# Patient Record
Sex: Female | Born: 1950
Health system: Southern US, Community
[De-identification: ages and names within clinical notes are randomized; demographics above are authoritative.]

## PROBLEM LIST (undated history)

## (undated) DIAGNOSIS — E785 Hyperlipidemia, unspecified: Secondary | ICD-10-CM

## (undated) DIAGNOSIS — M5481 Occipital neuralgia: Secondary | ICD-10-CM

## (undated) DIAGNOSIS — R51 Headache: Secondary | ICD-10-CM

## (undated) DIAGNOSIS — E559 Vitamin D deficiency, unspecified: Principal | ICD-10-CM

## (undated) DIAGNOSIS — M5020 Other cervical disc displacement, unspecified cervical region: Secondary | ICD-10-CM

## (undated) DIAGNOSIS — T4145XA Adverse effect of unspecified anesthetic, initial encounter: Secondary | ICD-10-CM

## (undated) DIAGNOSIS — S62102A Fracture of unspecified carpal bone, left wrist, initial encounter for closed fracture: Secondary | ICD-10-CM

## (undated) DIAGNOSIS — G47419 Narcolepsy without cataplexy: Secondary | ICD-10-CM

## (undated) DIAGNOSIS — F419 Anxiety disorder, unspecified: Secondary | ICD-10-CM

## (undated) DIAGNOSIS — M1990 Unspecified osteoarthritis, unspecified site: Secondary | ICD-10-CM

## (undated) DIAGNOSIS — D509 Iron deficiency anemia, unspecified: Secondary | ICD-10-CM

## (undated) DIAGNOSIS — T8859XA Other complications of anesthesia, initial encounter: Secondary | ICD-10-CM

## (undated) DIAGNOSIS — Z9889 Other specified postprocedural states: Secondary | ICD-10-CM

## (undated) DIAGNOSIS — M797 Fibromyalgia: Secondary | ICD-10-CM

## (undated) DIAGNOSIS — R112 Nausea with vomiting, unspecified: Secondary | ICD-10-CM

## (undated) DIAGNOSIS — K219 Gastro-esophageal reflux disease without esophagitis: Secondary | ICD-10-CM

## (undated) DIAGNOSIS — G5602 Carpal tunnel syndrome, left upper limb: Principal | ICD-10-CM

## (undated) DIAGNOSIS — S62101A Fracture of unspecified carpal bone, right wrist, initial encounter for closed fracture: Secondary | ICD-10-CM

## (undated) DIAGNOSIS — G8929 Other chronic pain: Secondary | ICD-10-CM

## (undated) DIAGNOSIS — I10 Essential (primary) hypertension: Secondary | ICD-10-CM

## (undated) HISTORY — DX: Fracture of unspecified carpal bone, right wrist, initial encounter for closed fracture: S62.102A

## (undated) HISTORY — DX: Other chronic pain: G89.29

## (undated) HISTORY — DX: Carpal tunnel syndrome, left upper limb: G56.02

## (undated) HISTORY — PX: DILATION AND CURETTAGE OF UTERUS: SHX78

## (undated) HISTORY — DX: Iron deficiency anemia, unspecified: D50.9

## (undated) HISTORY — DX: Fibromyalgia: M79.7

## (undated) HISTORY — DX: Fracture of unspecified carpal bone, right wrist, initial encounter for closed fracture: S62.101A

## (undated) HISTORY — DX: Vitamin D deficiency, unspecified: E55.9

## (undated) HISTORY — PX: FRACTURE SURGERY: SHX138

## (undated) HISTORY — PX: TONSILLECTOMY: SUR1361

## (undated) HISTORY — PX: COLONOSCOPY: SHX174

## (undated) HISTORY — DX: Occipital neuralgia: M54.81

## (undated) HISTORY — DX: Narcolepsy without cataplexy: G47.419

## (undated) HISTORY — DX: Hyperlipidemia, unspecified: E78.5

## (undated) HISTORY — DX: Other cervical disc displacement, unspecified cervical region: M50.20

---

## 1979-11-22 HISTORY — PX: GASTRIC BYPASS: SHX52

## 1998-11-21 HISTORY — PX: ANTERIOR CERVICAL DECOMP/DISCECTOMY FUSION: SHX1161

## 1999-10-22 ENCOUNTER — Encounter: Payer: Self-pay | Admitting: Neurological Surgery

## 1999-10-26 ENCOUNTER — Inpatient Hospital Stay (HOSPITAL_COMMUNITY): Admission: RE | Admit: 1999-10-26 | Discharge: 1999-10-27 | Payer: Self-pay | Admitting: Neurological Surgery

## 1999-10-26 ENCOUNTER — Encounter: Payer: Self-pay | Admitting: Neurological Surgery

## 1999-10-26 ENCOUNTER — Encounter (INDEPENDENT_AMBULATORY_CARE_PROVIDER_SITE_OTHER): Payer: Self-pay | Admitting: *Deleted

## 2000-02-07 ENCOUNTER — Encounter: Payer: Self-pay | Admitting: Neurological Surgery

## 2000-02-07 ENCOUNTER — Encounter: Admission: RE | Admit: 2000-02-07 | Discharge: 2000-02-07 | Payer: Self-pay | Admitting: Neurological Surgery

## 2000-04-08 ENCOUNTER — Encounter: Payer: Self-pay | Admitting: Neurological Surgery

## 2000-04-08 ENCOUNTER — Ambulatory Visit (HOSPITAL_COMMUNITY): Admission: RE | Admit: 2000-04-08 | Discharge: 2000-04-08 | Payer: Self-pay | Admitting: Neurological Surgery

## 2001-08-07 ENCOUNTER — Encounter: Payer: Self-pay | Admitting: Family Medicine

## 2001-08-07 ENCOUNTER — Ambulatory Visit (HOSPITAL_COMMUNITY): Admission: RE | Admit: 2001-08-07 | Discharge: 2001-08-07 | Payer: Self-pay | Admitting: Family Medicine

## 2004-05-25 ENCOUNTER — Inpatient Hospital Stay (HOSPITAL_COMMUNITY): Admission: RE | Admit: 2004-05-25 | Discharge: 2004-05-27 | Payer: Self-pay | Admitting: Orthopedic Surgery

## 2004-11-21 HISTORY — PX: BONE GRAFT HIP ILIAC CREST: SUR159

## 2005-11-08 ENCOUNTER — Ambulatory Visit (HOSPITAL_COMMUNITY): Admission: RE | Admit: 2005-11-08 | Discharge: 2005-11-08 | Payer: Self-pay | Admitting: Anesthesiology

## 2008-06-11 ENCOUNTER — Inpatient Hospital Stay (HOSPITAL_COMMUNITY): Admission: EM | Admit: 2008-06-11 | Discharge: 2008-06-13 | Payer: Self-pay | Admitting: Emergency Medicine

## 2008-06-12 ENCOUNTER — Ambulatory Visit: Payer: Self-pay | Admitting: Internal Medicine

## 2008-06-12 ENCOUNTER — Encounter: Payer: Self-pay | Admitting: Internal Medicine

## 2008-07-25 ENCOUNTER — Ambulatory Visit (HOSPITAL_COMMUNITY): Admission: RE | Admit: 2008-07-25 | Discharge: 2008-07-25 | Payer: Self-pay | Admitting: Family Medicine

## 2008-08-11 ENCOUNTER — Ambulatory Visit (HOSPITAL_COMMUNITY): Admission: RE | Admit: 2008-08-11 | Discharge: 2008-08-11 | Payer: Self-pay | Admitting: Family Medicine

## 2009-12-10 ENCOUNTER — Ambulatory Visit (HOSPITAL_COMMUNITY): Admission: RE | Admit: 2009-12-10 | Discharge: 2009-12-10 | Payer: Self-pay | Admitting: Family Medicine

## 2010-12-11 ENCOUNTER — Encounter: Payer: Self-pay | Admitting: Anesthesiology

## 2011-04-05 NOTE — Op Note (Signed)
Amanda Osborne, Amanda Osborne             ACCOUNT NO.:  [id number]   MEDICAL RECORD NO.:  [id number]          PATIENT TYPE:  INP   LOCATION:  A331                          FACILITY:  [facility]   PHYSICIAN:  R. Roetta Sessions, M.D. DATE OF BIRTH:  10/25/1951   DATE OF PROCEDURE:  06/12/2008  DATE OF DISCHARGE:                               OPERATIVE REPORT   INDICATIONS FOR PROCEDURE:  The patient is a 60 year old lady with a  recent hematemesis, status post Roux-en-Y bariatric surgery back in the  80s.  She has dropped her hemoglobin into the 9 range, but remains  hemodynamically stable.  She has had a colonoscopy 2 months ago by Dr.  Kinnie Scales done in [place].  She takes multiple NSAIDs.  EGD is now being  done.  Risks, benefits, alternatives, and limitations have been reviewed  and questions answered.  Please see the documentation in the medical  record.   PROCEDURE NOTE:  O2 saturation, blood pressure, pulse, and respirations  were monitored throughout the entire procedure.   CONSCIOUS SEDATION:  Versed  6 mg IV and Demerol 125 mg IV in divided  doses.   ANESTHESIA:  Cetacaine spray for topical pharyngeal anesthesia.   INSTRUMENT:  Pentax video chip system.   FINDINGS:  Examination of the tubular esophagus revealed cream-colored  raised plaques covering the entire esophageal mucosa.  These plaques  could not be washed off very easily.  There was a noncritical view of  peptic stricture with two areas of ulceration/tear, friable mucosa with  mucosal disruption, friability at the EG junction.  The EG junction was  easily traversed.  The residual stomach was small and empty.  Examination of the anastomosis with small bowel, there was a 2-cm area  of linear ulceration.  There was a clean base.  The areas were friable  but was not actively bleeding.  The anastomosis of the small bowel was  patent and easily traversed and examination of several centimeters of  the efferent limb of small bowel  revealed no abnormalities.  The scope  was pulled back into the stomach and this area was reexamined with no  additional findings.  The scope was pulled back in the esophagus with  biopsies of the esophagus and KOH brushing were taken.  The patient  tolerated the procedure well and was reactive to endoscopy.   IMPRESSION:  1. Two areas of ulceration, mucosal disruption of the esophagogastric      junction overlying a noncritical peptic stricture likely consistent      with Mallory-Weiss tear in the background setting of      gastroesophageal reflux disease likely the primary cause of      bleeding and anastomotic ulcer without bleeding stigmata.  2. Patent normal-appearing efferent limb.  3. Marked esophageal plaques suspicious for Candida esophagitis status      post biopsy brushing.   RECOMMENDATIONS:  1. It would be best to avoid nonsteroidals entirely.  2. Check Helicobacter pylori stool antigen test and treat if positive.  3. Follow up with biopsies and KOH prep.  4. Add Carafate 1 g slowly q.i.d., agree with  continuing PPI therapy.  5. Clear liquid diet.  6. Further recommendations to follow.      Jonathon Bellows, M.D.  Electronically Signed     RMR/MEDQ  D:  06/12/2008  T:  06/13/2008  Job:  [id number]   cc:   Lorin Picket A. Gerda Diss, MD  Fax: [phone]

## 2011-04-05 NOTE — Consult Note (Signed)
NAMECALYSE, MURCIA             ACCOUNT NO.:  [id number]   MEDICAL RECORD NO.:  [id number]          PATIENT TYPE:  INP   LOCATION:  A331                          FACILITY:  [facility]   PHYSICIAN:  R. Roetta Sessions, M.D. DATE OF BIRTH:  07-14-1951   DATE OF CONSULTATION:  06/12/2008  DATE OF DISCHARGE:                                 CONSULTATION   REASON FOR CONSULTATION:  GI bleed.   HISTORY OF PRESENT ILLNESS:  The patient is a 60 year old female with  history of stomach stapling in the 1980s, who has chronic pain,  followed by the pain clinic, currently on oxycodone 20 mg t.i.d., but  also takes Voltaren gel 3-4 times a week and BC Powders 1 three times a  day, who presents with complaints of hematemesis and hematochezia which  occurred approximately a week ago.  She states that she had felt fine.  She does have episodes; if she overeats, she becomes nauseated and  occasionally vomits.  Last Tuesday, when she went to eat supper, she had  one or two bites and already felt full, and then proceed to vomit.  She  vomited up fresh blood and blood clots.  The following day, she did have  a bowel movement which was normal, followed by nothing but fresh blood.  She has felt more weak.  She has had some epigastric burning.  She  finally decided to come for evaluation yesterday.  In the emergency  department, on digital rectal exam she was noted to have black,  Hemoccult-positive stools.  Her hemoglobin was 9.7, hematocrit 29.6, MCV  86.5.  Today, her hemoglobin is 9.5.  She has had no further hematemesis  and no bowel movements now in over 3 days.  She complains of chronic  constipation.  She generally takes Vear Clock milk of magnesia capsules  every other day.  Denies any chronic intermittent bleeding.  No  heartburn, dysphagia, or odynophagia.  No unintentional weight loss.   MEDICATIONS AT HOME:  1. Voltaren gel 3-4 times a week.  2. BC Powders.  For the last 3 months, she has been  taking 1 packet 3      times a day.  She states before that she took even more.  3. OxyContin 20 mg t.i.d.  4. Valium 5 mg t.i.d.  5. Iron 65 mg daily.  6. Multivitamin daily.  7. Pepcid p.r.n.  8. Phillips milk of magnesia capsules 4 every other day.   ALLERGIES:  No known drug allergies.   PAST MEDICAL HISTORY:  1. Stomach stapling, 1981.  2. Right shoulder/arm surgery in 2005.  Apparently, she had a right      Colles' fracture which was initially repaired in [place] but required      a redo in [place] because of poor outcome.  3. She has had neck surgery in 2000.  She states she has now a C5-C6      herniated disc but does not desire to have any further surgery.  4. She had colonoscopy a couple months ago by Dr. Kinnie Scales, and she      reports this to  be normal.  5. She describes having a remote EGD with Dr. Jena Gauss.  We are trying to      retrieve those results.  6. She sees pain management for neuralgia in neck, knees, wrist pain.  7. She believes she has a history of anemia off and on, and she is      taking iron.   FAMILY HISTORY:  She has two cousins with a history of colon cancer.  No  family history of peptic ulcer disease or other chronic GI illnesses.   SOCIAL HISTORY:  She is divorced.  She has a son.  Denies tobacco or  alcohol use.  Currently unemployed.   REVIEW OF SYSTEMS:  See HPI for GI.  CONSTITUTIONAL:  No unintentional  weight loss.  CARDIOPULMONARY:  No chest pain or shortness of breath.  GENITOURINARY:  No dysuria or hematuria.   PHYSICAL EXAMINATION:  VITAL SIGNS:  Height 68 inches, weight 98.1 kg,  temperature 98.8, pulse 76, respirations 18, blood pressure 122/71.  GENERAL:  Pleasant, well-nourished, well-developed Caucasian female in  no acute distress.  SKIN:  Warm and dry.  No jaundice.  HEENT:  Sclerae anicteric.  Oral mucosa moist and pink.  NECK:  No lymphadenopathy.  CHEST:  Lungs clear to auscultation.  CARDIAC:  Reveals regular rate and  rhythm.  Normal S1 and S2.  No  murmurs, rubs, or gallops.  ABDOMEN:  Positive bowel sounds.  Abdomen is obese, soft.  She has mild  epigastric tenderness to deep palpation.  No rebound or guarding.  No  organomegaly or masses.  No abdominal bruits or hernias.  LOWER EXTREMITIES:  No edema.   LABORATORIES:  White count 5600, hemoglobin today is 9.5, platelets  461,000.  Sodium 137, potassium 4, BUN 13, creatinine 0.56, glucose 113.   IMPRESSION:  The patient is a 60 year old lady with history of weight  loss surgery in the 1980s, now with hematemesis and bright red blood per  rectum, likely secondary to an upper GI bleed.  She reported normal  colonoscopy a couple months ago with Dr. Kinnie Scales.  She is chronic NSAIDs  and aspirin use in the way of BC Powders 3 times daily.  She is on  Voltaren gel, which can also induce GI bleeding.  I suspect she may have  peptic ulcer disease.   RECOMMENDATIONS:  1. EGD today.  2. PPI therapy.  3. Check coags.  4. Further recommendations to follow.      Tana Coast, P.AJonathon Bellows, M.D.  Electronically Signed    LL/MEDQ  D:  06/12/2008  T:  06/12/2008  Job:  [id number]   cc:   Lorin Picket A. Gerda Diss, MD  Fax: [phone]

## 2011-04-05 NOTE — H&P (Signed)
Amanda Osborne, Amanda Osborne             ACCOUNT NO.:  [id number]   MEDICAL RECORD NO.:  [id number]          PATIENT TYPE:  INP   LOCATION:  A331                          FACILITY:  [facility]   PHYSICIAN:  Donna Bernard, M.D.DATE OF BIRTH:  [birth year]   DATE OF ADMISSION:  06/11/2008  DATE OF DISCHARGE:  LH                              HISTORY & PHYSICAL   CHIEF COMPLAINT:  Weakness and vomiting blood.   SUBJECTIVE:  This patient is a 60 year old white female with a history  of chronic pain secondary to neuralgia and neuropathy, narcolepsy, and  chronic anxiety who presented to the ER today with subacute concerns.  The patient was eating a meal about a week ago, developed some  epigastric discomfort suddenly, and it was quite burning and  uncomfortable, and she proceeded to throw up a couple times large amount  of bright blood.  The following morning, the patient passed some dark  maroon blood per rectum almost uncontrolled.  She describes some  epigastric discomfort at times with this.  Of note, she did have a  colonoscopy earlier this year, which was reportedly negative.  The  patient then called our office on Friday with the history, she was  instructed to go to the emergency room immediately.  She failed to do  this.  Today, she was on her way to a [facility] when she noted  some epigastric discomfort accompanied by slight nausea and progressive  weakness.  She also had another bowel movement today, which appeared to  be quite dark with blood and she felt even though she really did not  want to go to the hospital that she was forced to do and so she came on  in here.  The patient claims compliance with medications, which include  Valium 5 mg p.o. t.i.d. p.r.n. for anxiety, OxyContin 20 mg p.o. t.i.d.  on a regular basis, and Pepcid AC p.r.n. for symptomatic care.  The  patient also had an iron tablet one a day after developing this bleeding  last week.   PAST SURGICAL  HISTORY:  1. Stomach stapling and gastric bypass surgery in 1981.  2. Right wrist fracture in 2005.  3. Cervical disk disease in 2000.   FAMILY HISTORY:  Positive for coronary artery disease.   ALLERGIES:  None known.   SOCIAL HISTORY:  The patient is single.  She does not smoke.  No alcohol  abuse.  She is under a lot of stress because she lost her job back in  September and is due to run out of her insurance also.   REVIEW OF SYSTEMS:  Otherwise negative.   PHYSICAL EXAMINATION:  GENERAL:  Somewhat pale appearing tearful anxious  female.  VITAL SIGNS:  Blood pressure 130/71 and pulse 85.  NEUROLOGIC:  The patient is alert and oriented x3.  HEENT:  Normal.  NECK:  Supple.  LUNGS:  Clear.  HEART:  Regular rate and rhythm.  ABDOMEN:  Slight epigastric tenderness, but very minimal.  No rebound.  No guarding.  RECTAL:  Per Dr. Margretta Ditty grossly heme-positive stool.  EXTREMITIES:  Thin.  SIGNIFICANT LABS:  White blood count 5.6, hemoglobin 9.7, and platelets  467.  Potassium 4.0 and bicarb 26.  UA unremarkable.   IMPRESSION:  1. Acute gastrointestinal bleed, likely upper gastrointestinal with      negative colonoscopy several months ago and more specifically      because of nature of history with history of gastric bypass the      patient may have developed anastomotic ulcer.  She could not have      developed a tear with subsequent bleeding.  Certainly will warrant      endoscopy.  2. Chronic pain.  3. Chronic anxiety.  4. Recent stressors with losing job and being single and having very      limited resources in the community.   PLAN:  IV fluids, IV Protonix, type and cross blood, GI consultation,  maintain chronic medications, Zofran for nausea, and p.r.n. medications.      Donna Bernard, M.D.  Electronically Signed     WSL/MEDQ  D:  06/11/2008  T:  06/12/2008  Job:  [id number]

## 2011-04-05 NOTE — Discharge Summary (Signed)
Amanda Osborne, Amanda Osborne             ACCOUNT NO.:  [id number]   MEDICAL RECORD NO.:  [id number]          PATIENT TYPE:  INP   LOCATION:  A331                          FACILITY:  [facility]   PHYSICIAN:  Scott A. Gerda Diss, MD    DATE OF BIRTH:  [birth year]   DATE OF ADMISSION:  06/11/2008  DATE OF DISCHARGE:  07/24/2009LH                               DISCHARGE SUMMARY   DISCHARGE DIAGNOSES:  1. Reflux.  2. Mallory-Weiss tear.  3. Esophageal candidiasis.  4. Status post bariatric surgery.  5. Anxiety and depression.  6. Chronic pain.   HOSPITAL COURSE:  This patient was admitted on June 11, 2008, after  significant weakness and multiple episodes of vomiting, and she noticed  that after throwing up several times, she threw up a large amount of  bright blood.  She continued to have some maroon blood per rectum, and  she felt herself to feel really quite bad.  She was going to her [facility] on the day of admission when she was having some slight  nausea and weakness and had another bowel movement on the same day that  was maroon and at that point in time, came in and was seen by Dr. Lubertha South, and the hemoglobin was down to 9.7 and it has felt that the  patient was having an acute GI bleed.  She was admitted in, consultation  was done by Dr. Jena Gauss and they felt most likely upper GI bleed.  She had  been using chronic NSAIDs, aspirin, and BC Powder.  She has been told  not to do that anymore.  She was also told not to use Voltaren gel, and  she went ahead and had an EGD done.  An EGD was done on June 12, 2008,  and it showed candidiasis esophageal, also suspected Mallory-Weiss tear,  and anastomosis ulcer, and she was encouraged to be on PPI and Diflucan  upon going home.  Her hemoglobin was stable on June 13, 2008, and is  felt she can go home.  She is to follow up in our office somewhere in  the next couple of weeks and follow up with GI within the next couple of  weeks and  return to the ED or office immediately if vomiting up blood or  maroon stools.      Scott A. Gerda Diss, MD  Electronically Signed     SAL/MEDQ  D:  07/18/2008  T:  07/18/2008  Job:  [id number]

## 2011-04-05 NOTE — Group Therapy Note (Signed)
NAMEADARIA, Amanda Osborne             ACCOUNT NO.:  [id number]   MEDICAL RECORD NO.:  [id number]          PATIENT TYPE:  INP   LOCATION:  A331                          FACILITY:  [facility]   PHYSICIAN:  Scott A. Gerda Diss, MD    DATE OF BIRTH:  30-Sep-1951   DATE OF PROCEDURE:  DATE OF DISCHARGE:                                 PROGRESS NOTE   Overall, she is doing better today.  She is still having some burning in  her stomach.  She is awaiting an EGD, it should be done later today.  Her hemoglobin is stable.  She has been using BC powders.  She also has  stapling of her stomach with what sounds to be a gastric bypass that was  done in the early 1980s.  I told her that she needs to stay away from  all NSAIDs, including NSAID creams and stick with the OxyContin and use  Tylenol as needed for pain.  She will possibly go home depending on test  outcome and repeat hemoglobin.      Scott A. Gerda Diss, MD  Electronically Signed     SAL/MEDQ  D:  06/12/2008  T:  06/12/2008  Job:  [id number]

## 2011-04-08 NOTE — Op Note (Signed)
Amanda Osborne, Amanda Osborne                        ACCOUNT NO.:  [id number]   MEDICAL RECORD NO.:  [id number]                   PATIENT TYPE:  INP   LOCATION:  2899                                 FACILITY:  [facility]   PHYSICIAN:  Dionne Ano. Everlene Other, M.D.         DATE OF BIRTH:  [birth year]   DATE OF PROCEDURE:  DATE OF DISCHARGE:                                 OPERATIVE REPORT   PREOPERATIVE DIAGNOSES:  1. Adhesive capsulitis, right shoulder.  2. Malunion, right distal radius, status post fracture.   POSTOPERATIVE DIAGNOSES:  1. Adhesive capsulitis, right shoulder.  2. Malunion, right distal radius, status post fracture.   PROCEDURE PERFORMED:  1. Manipulation under anesthesia, right shoulder.  2. Right distal radius corrective osteotomy with iliac crest bone graft and     placement of a 2.7 condylar blade plate.  3. Brachial radialis tenotomy.  4. Stress radiography.  5. Posterior interosseus nerve neurectomy.  6. Extensor pollicis longus decompression and transpositioning into the     subcutaneous tissue.   SURGEON:  Dionne Ano. Amanda Pea, M.D.   ASSISTANT:  Karie Chimera, P.A.-C.   ANESTHESIA:  General.   DRAINS:  One in the hip, one in the wrist.   COMPLICATIONS:  None.   INDICATIONS FOR PROCEDURE:  Mr. Moccio is a very pleasant 60 year old  female who presents with the above-mentioned diagnoses.  I have counseled  her in regards to the risks and benefits of the surgery, including the risk  of infection, bleeding, anesthesia, damage to normal structures, and failure  of surgery to __________, restoring function.  With this in mind, she  desires to proceed.  All questions have been encouraged and answered  preoperatively.   OPERATIVE FINDINGS:  This patient is a very pleasant 60 year old female who  had a malunion.  This was corrected with corrective osteotomy to acceptable  levels, including acceptable radial inclination, radial height and volar  tilt.  Manipulation  under anesthesia provided restoration of her shoulder  function, and there was no iatrogenic fracture that occurred, I should note.  I was pleased with the reconstruction measures and the x-rays noted  intraoperatively.  I should note that she will be managed aggressively in  terms of her rehabilitative course postoperatively.   OPERATION:  Patient seen by myself and anesthesia, taken to the operative  suite.  Preoperative Ancef was given.  She was placed in the supine position  and appropriately padded.  The table was then turned.  Once this was done, I  performed manipulation under anesthesia of the shoulder. She had adhesive  capsulitis in the shoulder and underwent a manipulation.  I began the  manipulation and flexion.  She had manipulation and flexion with a short  lever arc.  Following this, I manipulated her in abduction followed by  internal and external rotation.  I disreleased adhesions. There were no  dislocating features or fracture that occurred.  She was able to be  manipulated into acceptable parameters, and I was pleased with this.  Thus  with this performed, I then turned attention towards the patient's wrist.  She was prepped and draped in the usual sterile fashion about the right  upper extremity and right hip region.  The patient did have some adipose  tissue, which was abundant, about the abdominal region, and thus this was  taped accordingly.  Following this, I then began the operation with a dorsal  midline incision based over the interval between the third and fourth dorsal  compartments.  I dissected down and elevated the subcu off of the extensor  retinaculum.  Hemostasis was obtained with bipolar electrocautery.  Following this, the EPL tendon sheath was decompressed, and the tendon was  transposed.  Following this, I then dissected down to the bony region.  This  was elevated meticulously off of the bone where the malunion was present.  A  fourth dorsal  compartment was protected.  Following this, the brachial  radialis underwent tenotomy without difficulty.  I performed a tenotomy of  the brachial radialis without problems.  This was isolated and identified,  of course.  Following this, I performed a posterior interosseus nerve  neurectomy.  This was performed by identifying the posterior interosseus  nerve and resecting a 3 cm segment and allowing this to retract proximally.  Following this, I then placed a needle in the joint for purposes of  outlining the inclination and then placed two 0.062 K wires distally just  off of the subchondral bone.  This was placed under radiograph.  Once these  were placed, I then outlined my marks for the corrective osteotomy, placed  an oscillating saw through the proposed osteotomy site and used this to  break the dorsal and the medial and lateral cortices.  The lower cortice was  not violated.  Following this, I then used the 0.062 K wires as a lever arm  and placed the appropriate amount of correction.  Dorsally, the patient  required a graft to fit at least a 1 cm gap.  This was measured.  Following  this, I deflated the tourniquet and obtained an iliac crest bone graft.  I  incised the iliac crest, which was previously prepared and Marcaine with  epinephrine was placed in this region.  Following the incision, the patient  then had retractors placed.  There was a large amount of adipose tissue, and  I very carefully dissected down to the iliac crest.  Baby Bennett retractors  and Cobb elevator were used to protect the abdominal musculature.  The  patient then had an oscillating saw used to obtain a tricortical bone graft.  This was performed to my satisfaction without difficulty.  Following  placement of the oscillating saw, the tricortical bone graft was obtained,  and cancellous bone graft was obtained as well.  Once this was done, I then irrigated copiously and obtained hemostasis.  Gelfoam and  thrombin were  placed in the wound, and a moistened lap was placed in here as well.  Following this, tricortical bone graft was placed in the distal radius.  The  tourniquet was inflated for a brief period of time, once again, and the  tricortical bone graft underwent bone carpentry and then was placed in the  defect.  This corrected the patient to excellent radiographic parameters,  including radial height, inclination, and volar tilt.  I then placed a 2.7  blade plate on the construct and packed additional bone graft as  necessary.  The tricortical graft fit excellently, and I was extremely pleased with this  as well as the 2.7 blade plate purchase, which was applied in a standard AO  technique.  X-rays confirmed this, and permanent films were taken for  documentation.  Following this, the area was irrigated copiously.  I then  performed closure by closing the deep periosteal tissues and covering the  plate nicely.  The EBL was left transposed out of harm's way, and the fourth  dorsal compartment did not encroach upon the plate.  I then closed the wound  over a #7 TLS drain with 3-0 Vicryl suture and Prolene.  Hemostasis was  excellent.  A small amount of Marcaine 0.25% without epinephrine was placed  for postop analgesia purposes.  Patient then had sterile dressing placed.  Attention was then turned towards the hip, where copious irrigation was  applied.  The deep tissue was closed with 0 Vicryl followed by 2-0 Vicryl in  the subcutaneous tissue followed by staple gun at the skin edge.  The  patient tolerated this well, and there were no complicating features.  The  medium Hemovac drain, which was placed prior to deep fascial closure was  hooked up to suction.  The patient tolerated this well.  There were no  complicating features.  She was extubated and transferred to the recovery  room, where she will be monitored closely.  We will plan for pain  management, IV antibiotics, OT consult,  as well as other general measures.  I have discussed with the family all issues.  It has been an absolute  pleasure participating in her  care, and we look forward to participating in her postoperative recovery.  I  was quite pleased with the final position and correction and noted that the  patient had soft compartments, excellent refill, and was stable in the  recovery room.                                               Dionne Ano. Everlene Other, M.D.    [facility]  D:  05/25/2004  T:  05/26/2004  Job:  [id number]   cc:   Dr. Julieta Gutting, [place]   Titus Plomaritis  [address] [place]  [place] [address]  Fax: [phone]

## 2011-04-08 NOTE — H&P (Signed)
[facility]  Patient:    Amanda Osborne                       MRN: [id number] Adm. Date:  [id number] Attending:  Jonne Ply                         History and Physical  ADMISSION DIAGNOSES: 1. Cervical herniated nucleus pulposus, C6-7 with left C7 radiculopathy. 2. Right shoulder lipoma.  HISTORY OF PRESENT ILLNESS:  The patient is a 60 year old individual who works s a Loss adjuster, chartered.  She has had periods of arm pain that started in 1996.  She developed neck pain starting in August of 1999, and she notes that the pain feels as though there is a pressure sensation in her neck which will radiate into the  left arm and hand and cause numbness into the left hand and fingers.  She has had pain in the right lower extremity and weakness that she feels is in the right arm and right leg.  She has tingling over the left arm and shoulder.  She has some difficulty with urinary control which has been ongoing for sometime.  She has significant headache.  She has been treating this condition conservatively.  I initially evaluated her in July of this year and advised further conservative management; however, in August of this year, she returned with continued complaints of significant neck, shoulder, and left arm pain.  She was advised regarding surgical decompression of the C6-7 disk herniation that she has with compression of the left C7 nerve root.  The patient also notes that she has the presence of a right shoulder lipoma overlying the scapula.  This measures 10 cm x 8 cm and raises the skin approximately 1 cm over the shoulder itself.  This is moveable.  She wishes that this should be removed.  PAST MEDICAL HISTORY:  Her general health has been fair.  She has had two surgical procedures including a stomach stapling in 1981 and nerves removed from her uterus in 1972.  She takes no medications on a chronic basis.  SOCIAL HISTORY:  She  does not smoke.  She drinks alcohol socially.  She has had a 50-pound weight gain over the past year to a current weight of 248 pounds.  MEDICATIONS:  Pepcid AC, Xanax, Soma, Vicodin up to every 6 hours.  REVIEW OF SYSTEMS:  Notable for the items in the History of Present Illness in addition to swelling of feet and hands, leg pain while walking, ringing in the ears, wearing of glasses, incontinence, increased appetite and anxiety.  FAMILY HISTORY:  Reveals significant diabetes and heart problems in her mother Anselm Jungling is age 70, in poor health, and has a pacemaker.  Her father is 54 years old and in good health.  PHYSICAL EXAMINATION:  GENERAL:  She is an alert, oriented, and cooperative individual in no overt distress.  She does appear to be fairly anxious.  MUSCULOSKELETAL:   There is a moderate amount of paravertebral spasm and tenderness in her neck.  There is tenderness in the supraclavicular fossae in both proximal shoulders.   Range of motion in her neck reveals that she turns 60 degrees to the right and 60 degrees to the left.  She extends 30 degrees and flexes 10 degrees. Axial compression does not reproduce significant pain.  NEUROLOGIC:  Motor strength in the upper extremities reveals the deltoid, biceps, triceps,  grips, and intrinsics have good strength to confrontation.  Tone and bulk are normal.  Sensation to pin and light touch in the distal upper extremities is diminished on the left side in the C7-C8 distribution.  Cranial nerve examination reveals the pupils are 4 mm, briskly reactive to light and accommodation.  The extraocular movements are full.  The face is symmetric o grimace.  Tongue and uvula are in the midline.  HEENT:  Sclerae and conjunctivae are clear.  LUNGS:  Clear to auscultation.  HEART:  Regular rate and rhythm.  No murmurs are heard.  ABDOMEN:  Soft, protuberant.  Bowel sounds are positive.  No masses are identified.  EXTREMITIES:   Reveal no cyanosis, clubbing, or edema.  SKIN:  On the back, on the right shoulder, there is a freely palpable lipoma that is 10 cm in length, 8 cm in width.  It raises the skin approximately 1 cm. This overlays the center of the right scapula.  IMPRESSION:  The patient has cervical herniated nucleus pulposus at C6-7.  She lso has some spondylitic disease at C5-6; however, this does not cause any canal compromise.  She has been advised regarding surgical decompression of this area and is now being admitted to undergo surgical intervention and removal of a right shoulder lipoma. DD:  10/26/99 TD:  10/26/99 Job: [id number]

## 2011-04-08 NOTE — Op Note (Signed)
. [facility]  Patient:    Amanda Osborne                       MRN: [id number] Proc. Date: 10/26/99 Adm. Date:  [id number] Attending:  Jonne Ply                           Operative Report  PREOPERATIVE DIAGNOSES: 1. C6-7 herniated nucleus pulposus with left C7 radiculopathy. 2. Right shoulder lipoma.  POSTOPERATIVE DIAGNOSES: 1. C6-7 herniated nucleus pulposus with left C7 radiculopathy. 2. Right shoulder lipoma.  PROCEDURE:  Resection of right shoulder lipoma and anterior cervical diskectomy and arthrodesis, C6-7.  Fixation with Synthes plate.  SURGEON:  Stefani Dama, M.D.  ANESTHESIA:  General endotracheal.  INDICATIONS:  The patient is a 60 year old individual who has had significant neck, shoulder, and left arm pain.  In addition, she has the presence of a right shoulder lipoma.  She has failed extensive efforts at conservative management and is taken the operating room for surgery.  DESCRIPTION OF PROCEDURE:  The patient was brought to the operating room and placed on the table in the supine position.  After the smooth induction of general endotracheal anesthesia, she was turned on to her left lateral decubitus position. The right shoulder was prepped over the region of the scapula and a 3 cm incision was created in the midline after infiltrating with 1% lidocaine with epinephrine. The subcutaneous tissue was explored and a freely-movable lipoma was easily identified in the subcutaneous tissue and then, by blunt dissection using finger technique, the lipoma was freed of its fascial attachments and delivered through this incision.  Attachments to a stalk which had some vascularity to it were cauterized and then divided.  The stalk and the specimen were sent to pathology. The cavity was then identified and examined.  Hemostasis was achieved within it. No remnants of a lipoma were found and the wound was closed with  2-0 Vicryl and 3-0 Vicryl in the subcuticular tissues.  A Tegaderm dressing was placed on it.  The  patient was then turned into the supine position, placed in five pounds of halter traction.  The neck was prepped with DuraPrep and draped in a sterile fashion. A transverse incision was made at the base of the neck on the left side.  The plane between the sternocleidomastoid and the strap muscles was dissected bluntly until the prevertebral space was reached.  The first identifiable disk space was noted to be C5-6 on a radiograph.  Dissection was then carried inferiorly and a Caspar retractor was placed in the wound.  Diskectomy was then performed using a combination of curets and rongeurs and a Midas Rex and an A2 bur to dissect and  remove the lateral uncinate processes, both of which were modestly enlarged. In the left lateral gutter, free fragments of disk were encountered, which were fairly scarred to the inferior portion of the body of C7.  These were removed and decompressed the lateral recess nicely.  Hemostasis from the dural veins was obtained with some Gelfoam soaked in thrombin, which was later removed.  The right side was similarly decompressed, but no fragments of disk were identified here. A 7 mm round fibular graft was placed into the interspace, traction was removed, nd the neck was placed in slight flexion.  An 18 mm small-stature Synthes plate was then affixed with four locking 12 x 4 mm screws.  A localizing radiograph identified good position of the hardware in the graft.  Hemostasis from the soft tissues was identified and obtained with the bipolar cautery, and then the platysma was closed with 2-0 Vicryl in interrupted fashion, with 3-0 Vicryl used subcuticularly.  The patient tolerated the procedure wel and was returned to the recovery room in stable condition. DD:  10/26/99 TD:  10/26/99 Job: [id number]

## 2011-08-19 LAB — DIFFERENTIAL
Basophils Relative: 1
Eosinophils Absolute: 0.4
Eosinophils Relative: 7 — ABNORMAL HIGH
Lymphs Abs: 1.2
Monocytes Relative: 8
Neutro Abs: 3.5
Neutrophils Relative %: 64

## 2011-08-19 LAB — HEMOGLOBIN AND HEMATOCRIT, BLOOD
HCT: 27.3 — ABNORMAL LOW
HCT: 27.5 — ABNORMAL LOW
HCT: 28.7 — ABNORMAL LOW
Hemoglobin: 8.6 — ABNORMAL LOW
Hemoglobin: 9.1 — ABNORMAL LOW
Hemoglobin: 9.2 — ABNORMAL LOW
Hemoglobin: 9.5 — ABNORMAL LOW

## 2011-08-19 LAB — PROTIME-INR
INR: 1
Prothrombin Time: 12.9

## 2011-08-19 LAB — CROSSMATCH

## 2011-08-19 LAB — CBC
Hemoglobin: 9.7 — ABNORMAL LOW
MCHC: 32.9
Platelets: 461 — ABNORMAL HIGH
RBC: 3.42 — ABNORMAL LOW
RDW: 14
WBC: 5.6

## 2011-08-19 LAB — URINALYSIS, ROUTINE W REFLEX MICROSCOPIC
Glucose, UA: NEGATIVE
Ketones, ur: NEGATIVE
Specific Gravity, Urine: 1.025
pH: 5

## 2011-08-19 LAB — BASIC METABOLIC PANEL: Creatinine, Ser: 0.56

## 2011-08-19 LAB — KOH PREP

## 2011-08-19 LAB — ABO/RH: ABO/RH(D): O NEG

## 2012-02-08 ENCOUNTER — Other Ambulatory Visit: Payer: Self-pay | Admitting: Family Medicine

## 2012-02-08 DIAGNOSIS — Z139 Encounter for screening, unspecified: Secondary | ICD-10-CM

## 2012-02-16 ENCOUNTER — Ambulatory Visit (HOSPITAL_COMMUNITY): Payer: Self-pay

## 2012-05-02 ENCOUNTER — Ambulatory Visit (HOSPITAL_COMMUNITY)
Admission: RE | Admit: 2012-05-02 | Discharge: 2012-05-02 | Disposition: A | Discharge status: Home / Self Care | Payer: Medicare Other | Source: Ambulatory Visit | Attending: Family Medicine | Admitting: Family Medicine

## 2012-05-02 ENCOUNTER — Other Ambulatory Visit: Payer: Self-pay | Admitting: Family Medicine

## 2012-05-02 DIAGNOSIS — M5030 Other cervical disc degeneration, unspecified cervical region: Secondary | ICD-10-CM | POA: Insufficient documentation

## 2012-05-02 DIAGNOSIS — M542 Cervicalgia: Secondary | ICD-10-CM | POA: Insufficient documentation

## 2012-05-02 DIAGNOSIS — M5412 Radiculopathy, cervical region: Secondary | ICD-10-CM

## 2012-05-02 DIAGNOSIS — Z981 Arthrodesis status: Secondary | ICD-10-CM | POA: Insufficient documentation

## 2012-05-30 ENCOUNTER — Other Ambulatory Visit: Payer: Self-pay | Admitting: Family Medicine

## 2012-05-30 DIAGNOSIS — E041 Nontoxic single thyroid nodule: Secondary | ICD-10-CM

## 2012-06-07 ENCOUNTER — Ambulatory Visit (HOSPITAL_COMMUNITY)
Admission: RE | Admit: 2012-06-07 | Discharge: 2012-06-07 | Disposition: A | Discharge status: Home / Self Care | Payer: Medicare Other | Source: Ambulatory Visit | Attending: Family Medicine | Admitting: Family Medicine

## 2012-06-07 DIAGNOSIS — E041 Nontoxic single thyroid nodule: Secondary | ICD-10-CM

## 2012-11-30 ENCOUNTER — Encounter (HOSPITAL_COMMUNITY): Payer: Medicare Other | Attending: Oncology | Admitting: Oncology

## 2012-11-30 ENCOUNTER — Encounter (HOSPITAL_COMMUNITY): Payer: Self-pay | Admitting: Oncology

## 2012-11-30 VITALS — BP 127/79 | HR 84 | Temp 96.4°F | Resp 18 | Ht 66.5 in | Wt 207.9 lb

## 2012-11-30 DIAGNOSIS — IMO0001 Reserved for inherently not codable concepts without codable children: Secondary | ICD-10-CM | POA: Insufficient documentation

## 2012-11-30 DIAGNOSIS — D649 Anemia, unspecified: Secondary | ICD-10-CM

## 2012-11-30 DIAGNOSIS — I10 Essential (primary) hypertension: Secondary | ICD-10-CM | POA: Insufficient documentation

## 2012-11-30 DIAGNOSIS — M1990 Unspecified osteoarthritis, unspecified site: Secondary | ICD-10-CM | POA: Insufficient documentation

## 2012-11-30 DIAGNOSIS — D509 Iron deficiency anemia, unspecified: Secondary | ICD-10-CM

## 2012-11-30 HISTORY — DX: Iron deficiency anemia, unspecified: D50.9

## 2012-11-30 LAB — CBC WITH DIFFERENTIAL/PLATELET
Basophils Absolute: 0 10*3/uL (ref 0.0–0.1)
Eosinophils Absolute: 0.3 10*3/uL (ref 0.0–0.7)
Eosinophils Relative: 4 % (ref 0–5)
Lymphs Abs: 1 10*3/uL (ref 0.7–4.0)
MCH: 29.9 pg (ref 26.0–34.0)
MCV: 88.4 fL (ref 78.0–100.0)
Neutrophils Relative %: 72 % (ref 43–77)
Platelets: 320 10*3/uL (ref 150–400)
RBC: 5.02 MIL/uL (ref 3.87–5.11)
RDW: 13.5 % (ref 11.5–15.5)
WBC: 6.5 10*3/uL (ref 4.0–10.5)

## 2012-11-30 LAB — COMPREHENSIVE METABOLIC PANEL
ALT: 15 U/L (ref 0–35)
AST: 19 U/L (ref 0–37)
Albumin: 4.3 g/dL (ref 3.5–5.2)
Alkaline Phosphatase: 58 U/L (ref 39–117)
Calcium: 9.7 mg/dL (ref 8.4–10.5)
Potassium: 4 mEq/L (ref 3.5–5.1)
Sodium: 134 mEq/L — ABNORMAL LOW (ref 135–145)
Total Protein: 7.5 g/dL (ref 6.0–8.3)

## 2012-11-30 LAB — RETICULOCYTES: Retic Count, Absolute: 90.4 10*3/uL (ref 19.0–186.0)

## 2012-11-30 NOTE — Patient Instructions (Addendum)
.  [facility] Discharge Instructions  RECOMMENDATIONS MADE BY THE CONSULTANT AND ANY TEST RESULTS WILL BE SENT TO YOUR REFERRING PHYSICIAN.  EXAM FINDINGS BY THE PHYSICIAN TODAY AND SIGNS OR SYMPTOMS TO REPORT TO CLINIC OR PRIMARY PHYSICIAN: Exam per Elijah Birk and Dr. Mariel Sleet  MEDICATIONS PRESCRIBED:  Continue same  INSTRUCTIONS GIVEN AND DISCUSSED: Collect stool cards times 6 then repeat  SPECIAL INSTRUCTIONS/FOLLOW-UP: Week to 10 days  Thank you for choosing [facility] to provide your oncology and hematology care.  To afford each patient quality time with our providers, please arrive at least 15 minutes before your scheduled appointment time.  With your help, our goal is to use those 15 minutes to complete the necessary work-up to ensure our physicians have the information they need to help with your evaluation and healthcare recommendations.    Effective January 1st, 2014, we ask that you re-schedule your appointment with our physicians should you arrive 10 or more minutes late for your appointment.  We strive to give you quality time with our providers, and arriving late affects you and other patients whose appointments are after yours.    Again, thank you for choosing [facility].  Our hope is that these requests will decrease the amount of time that you wait before being seen by our physicians.       _____________________________________________________________  Should you have questions after your visit to [facility], please contact our office at [phone] between the hours of 8:30 a.m. and 5:00 p.m.  Voicemails left after 4:30 p.m. will not be returned until the following business day.  For prescription refill requests, have your pharmacy contact our office with your prescription refill request.

## 2012-11-30 NOTE — Progress Notes (Signed)
[facility] NEW PATIENT EVALUATION   Name: Amanda Osborne Date: 11/30/2012 MRN: [id number] DOB: 1950-12-04    CC: [name], MD     DIAGNOSIS: The primary encounter diagnosis was Iron deficiency anemia. A diagnosis of Anemia was also pertinent to this visit.   HISTORY OF PRESENT ILLNESS:Amanda Osborne is a 62 y.o. Caucasian female who is referred to the [facility] for iron deficiency anemia. She has a past medical history significant for chronic pain, fibromyalgia, osteoarthritis, chronic back pain, elevated LDL, narcolepsy, GERD, hypertension.   The patient reports that she has an "overwhelmingly lack of energy, tiredness, and fatigue." She reports that she craves vinegar and this is been going on for many years. It has been worse over the past 6 months. As a matter of fact, the patient reports waking up in the middle of the night craving apple cider vinegar. I do not have a correlation for this.  She reports that she was following up with her primary care physician at which point in time she noted some fatigue and tiredness. Laboratory work was performed. On 06/18/2012, the patient's serum iron was noted to be 27, TIBC elevated at 461, percent saturation low at 6 percent, ferritin I., white blood cell count 4.0, hemoglobin within normal limits at 12.8 g/dL, hematocrit 40.9%, MCV 76.0, MCHC 33.3, and a platelet count of 360,000.   At that point in time, from what I can gather, the patient was placed on oral iron, namely ferrous sulfate 325 mg twice daily with 65 mg of elemental iron. Subsequent lab work was performed, but unfortunate do not have those results on hand.  The patient reports a history of a gastric bypass. This was performed in 1981 by Dr. Tish Men. She reports that she was one of his first patient's. In 1981, she wait 317 pounds. By the patient's description, it sounds like the patient had a modified Roux-en-Y  She has been on ferrous  sulfate for approximately 6 months, near the time her July laboratory work was performed as described above. When she found out she had a consultation here at the cancer Center, she stopped her ferrous sulfate for approximately 10 days but then started feeling worse again. She therefore restart her oral iron.  She quantify the amount of fatigue as she has by reporting that in the morning she wakes up and prepares breakfast and returns back to a resting state.  On medication review, it should be noted that the patient is consuming 400 mg of ibuprofen 3 times daily and 1 BC tablet containing 845 mg of aspirin.  She relays a story that in 2009, she was diagnosed with esophageal tear. This was found after a large emesis of blood. Instead of reporting to the emergency Department, the patient went to bed later that night and reports a large amount of blood from her rectum throughout the night. It took her approximately 1-1/2 weeks before she reported the emergency department. She was seen by Dr. Jena Gauss, GI, at that time.  The patient admits to fatigue, tiredness.  She denies any headaches, dizziness, double vision, fevers, chills, night sweats, nausea, vomiting, diarrhea, constipation, abdominal pain, chest pain, heart palpitations, blood in stool, black tarry stool, urinary pain, urinary frequency, hematuria, tongue pain, pica, pain at corner of the mouth.   FAMILY HISTORY:  The patient's mother passed weight the age of 58 secondary to a stroke. She had chronic illnesses including heart disease and diabetes. The patient's father is  alive at the age of 65 and he had a surgical operation for carotid stenosis which caused emboli. She has one brother who is alive at the age of 36. Unfortunately he was a drug addict and now lives in assisted living. She has one son who is 47 years old who is healthy. She has 2 grandchildren who are healthy as well.  She denies a family history of anemia. She does admit to a  family history of cancer on her father's side. She has 2 first cousins who had cancer. One had colon cancer and the other had an unknown cancer.    PAST MEDICAL HISTORY:  has a past medical history of Wrist fracture, bilateral (2005 and 2010); Cervical disc herniation; Narcolepsy; Occipital neuralgia; and Iron deficiency anemia (11/30/2012).    1. Chronic pain 2. Fibromyalgia 3. Osteoarthritis 4. Chronic back pain 5. Elevated LDL 6. Narcolepsy 7. GERD 8. Hypertension      CURRENT MEDICATIONS: See CH[facility] 1. Prilosec 2. Oxycodone/APAP, 5/325 3. B12 500 mcg liquid 4. Lisinopril 5. HCTZ 6. Xanax 0.5 mg 7. BC capsule with 145 mg of aspirin 8. Ibuprofen 400 mg 3 times daily 9. Ferrous sulfate 325 mg twice daily with 75 mg of elemental Iron   SOCIAL HISTORY: Patient was born and raised in [place]. She graduated high school. 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  She is divorced for 35 years. She lives by herself at home. She denies any alcohol, tobacco, or illicit drug abuse.    gynecologic history: Patient reached menarche and 6 grade. She reached menopause at age 74. She is G2 P1 with an abortion. she is overdue for her mammogram. This is been ordered by her primary care physician.   ALLERGIES: Review of patient's allergies indicates not on file.   LABORATORY DATA:  Lab work as described above in history of present illness.    PHYSICAL EXAM:  height is 5' 6.5" (1.689 m) and weight is 207 lb 14.4 oz (94.303 kg). Her oral temperature is 96.4 F (35.8 C). Her blood pressure is 127/79 and her pulse is 84. Her respiration is 18.  General appearance: alert, cooperative, appears older than stated age, no distress, moderately obese, pale and chronically-ill appearing, flat affect Head: Normocephalic, without obvious abnormality, atraumatic.  No sore tongue or angular cheilitis Neck: no adenopathy  and supple, symmetrical, trachea midline Lymph nodes: Cervical, supraclavicular, and axillary nodes normal. Resp: clear to auscultation bilaterally and normal percussion bilaterally Back: symmetric, no curvature. ROM normal. No CVA tenderness. Cardio: regular rate and rhythm, S1, S2 normal, no murmur, click, rub or gallop GI: soft, non-tender; bowel sounds normal; no masses,  no organomegaly Extremities: extremities normal, atraumatic, no cyanosis or edema and No flattening of fingernails appreciated. Neurologic: Grossly normal     IMPRESSION:  1. Iron deficiency anemia, S/P 6 months of Ferrous sulfate 326 mg BID 2. Tired 3. Fatigue 4. pain 5. Fibromyalgia 6. Osteoarthritis 7. chronic back pain  8. Elevated LDL 9. Narcolepsy 10. GERD 11. Hypertension 12. History of esophageal tear, treated by Dr. Jena Gauss. 13. History of gastric bypass, modified Roux-en-Y, in 1981 by Dr. Tish Men   PLAN:  1. I personally reviewed and went over laboratory results with the patient. 2. Labs today: CBC diff, CMET, Anemia Panel, Retic Count, Homocysteine level, Methylmalonic Acid level, Copper Level. 3. Patient education regarding iron deficiency anemia. 4. 6 stool cards, continue all medications during testing time.  5. Return in 7-10 days.  All questions were answered.  The patient knows to call the clinic with any questions or concerns.   Patient's case discussed with Dr. Mariel Sleet.  Patient seen by Dr. Mariel Sleet as well.   KEFALAS,THOMAS

## 2012-11-30 NOTE — Progress Notes (Signed)
Amanda Osborne presented for [employer]. Labs per MD order drawn via Peripheral Line 25 gauge needle inserted in rt ac.  Good blood return present. Procedure without incident.  Needle removed intact. Patient tolerated procedure well.

## 2012-12-01 LAB — IRON AND TIBC
Iron: 87 ug/dL (ref 42–135)
Saturation Ratios: 21 % (ref 20–55)
TIBC: 417 ug/dL (ref 250–470)

## 2012-12-01 LAB — FOLATE: Folate: 10 ng/mL

## 2012-12-01 LAB — VITAMIN B12: Vitamin B-12: >2000 pg/mL — ABNORMAL HIGH (ref 211–911)

## 2012-12-05 LAB — METHYLMALONIC ACID, SERUM: Methylmalonic Acid, Quantitative: <0.09 umol/L (ref <0.40)

## 2012-12-11 ENCOUNTER — Encounter (HOSPITAL_BASED_OUTPATIENT_CLINIC_OR_DEPARTMENT_OTHER): Payer: Medicare Other | Admitting: Oncology

## 2012-12-11 ENCOUNTER — Encounter (HOSPITAL_COMMUNITY): Payer: Self-pay | Admitting: Oncology

## 2012-12-11 VITALS — BP 107/61 | HR 72 | Temp 97.3°F | Resp 16 | Wt 214.4 lb

## 2012-12-11 DIAGNOSIS — D509 Iron deficiency anemia, unspecified: Secondary | ICD-10-CM

## 2012-12-11 DIAGNOSIS — D649 Anemia, unspecified: Secondary | ICD-10-CM

## 2012-12-11 LAB — OCCULT BLOOD X 1 CARD TO LAB, STOOL
Fecal Occult Bld: NEGATIVE
Fecal Occult Bld: NEGATIVE
Fecal Occult Bld: NEGATIVE

## 2012-12-11 NOTE — Progress Notes (Signed)
Problem number 1 iron deficiency anemia now with a normal hemoglobin, normal MCV, normal hematocrit. Ferritin is on the low and of normal. Serum iron is normal TIBC is at the higher end of the  range and percent saturation is barely within the normal range. She feels somewhat better but is not perfect. She is status post this bypass procedure in 1981 which entailed partial gastric narrowing but not resection it sounds like but with attaching small bowel to the stomach narrowed area that she was told was the size of an egg at the end of the procedure.  She's been taking 2 iron pills a day which I am going to reduce to one and day since she has constipation as a side effect. I want her to exercise to help burn some of her calories and to get her weight down to 188 pounds. That was the best weight she had post gastric bypass surgery. She needs to aim for that weight again.  We will see her in 3 months with lab work. If she is dropping her hemoglobin, ferritin, iron studies etc. then I would consider IV iron for establishing full iron replacement. If she has any ongoing side effects from the oral iron I would consider stopping it altogether and going with maintenance IV iron realistically. Her other laboratory work including B12 levels folic acid levels were well within the normal range.

## 2012-12-11 NOTE — Patient Instructions (Addendum)
[facility] Discharge Instructions  RECOMMENDATIONS MADE BY THE CONSULTANT AND ANY TEST RESULTS WILL BE SENT TO YOUR REFERRING PHYSICIAN.  EXAM FINDINGS BY THE PHYSICIAN TODAY AND SIGNS OR SYMPTOMS TO REPORT TO CLINIC OR PRIMARY PHYSICIAN: Exam and discussion by MD.  Your blood work is good.  MEDICATIONS PRESCRIBED:  You can decrease your iron to 1 time daily.  INSTRUCTIONS GIVEN AND DISCUSSED: Report increased fatigue or shortness of breath.  SPECIAL INSTRUCTIONS/FOLLOW-UP: Blood work in April and to see PA afterwards.  Thank you for choosing [facility] to provide your oncology and hematology care.  To afford each patient quality time with our providers, please arrive at least 15 minutes before your scheduled appointment time.  With your help, our goal is to use those 15 minutes to complete the necessary work-up to ensure our physicians have the information they need to help with your evaluation and healthcare recommendations.    Effective January 1st, 2014, we ask that you re-schedule your appointment with our physicians should you arrive 10 or more minutes late for your appointment.  We strive to give you quality time with our providers, and arriving late affects you and other patients whose appointments are after yours.    Again, thank you for choosing [facility].  Our hope is that these requests will decrease the amount of time that you wait before being seen by our physicians.       _____________________________________________________________  Should you have questions after your visit to [facility], please contact our office at [phone] between the hours of 8:30 a.m. and 5:00 p.m.  Voicemails left after 4:30 p.m. will not be returned until the following business day.  For prescription refill requests, have your pharmacy contact our office with your prescription refill request.

## 2013-01-05 ENCOUNTER — Other Ambulatory Visit: Payer: Self-pay

## 2013-03-11 ENCOUNTER — Encounter (HOSPITAL_COMMUNITY): Payer: Self-pay | Admitting: Oncology

## 2013-03-11 ENCOUNTER — Encounter (HOSPITAL_COMMUNITY): Payer: Medicare Other | Attending: Oncology

## 2013-03-11 DIAGNOSIS — R5381 Other malaise: Secondary | ICD-10-CM | POA: Insufficient documentation

## 2013-03-11 DIAGNOSIS — D649 Anemia, unspecified: Secondary | ICD-10-CM | POA: Insufficient documentation

## 2013-03-11 DIAGNOSIS — D509 Iron deficiency anemia, unspecified: Secondary | ICD-10-CM | POA: Insufficient documentation

## 2013-03-11 LAB — CBC
MCH: 31 pg (ref 26.0–34.0)
MCV: 90.6 fL (ref 78.0–100.0)
Platelets: 280 10*3/uL (ref 150–400)
RBC: 4.78 MIL/uL (ref 3.87–5.11)
RDW: 13.3 % (ref 11.5–15.5)

## 2013-03-11 LAB — IRON AND TIBC: Saturation Ratios: 13 % — ABNORMAL LOW (ref 20–55)

## 2013-03-11 NOTE — Progress Notes (Signed)
Labs drawn today for cbc,ferr,Iron and IBC 

## 2013-03-19 ENCOUNTER — Encounter (HOSPITAL_COMMUNITY): Payer: Self-pay | Admitting: Oncology

## 2013-03-19 ENCOUNTER — Encounter (HOSPITAL_BASED_OUTPATIENT_CLINIC_OR_DEPARTMENT_OTHER): Payer: Medicare Other | Admitting: Oncology

## 2013-03-19 VITALS — BP 101/69 | HR 75 | Temp 98.3°F | Resp 16 | Wt 209.3 lb

## 2013-03-19 DIAGNOSIS — R5383 Other fatigue: Secondary | ICD-10-CM

## 2013-03-19 DIAGNOSIS — D509 Iron deficiency anemia, unspecified: Secondary | ICD-10-CM

## 2013-03-19 DIAGNOSIS — R5381 Other malaise: Secondary | ICD-10-CM

## 2013-03-19 DIAGNOSIS — R531 Weakness: Secondary | ICD-10-CM

## 2013-03-19 NOTE — Progress Notes (Signed)
Amanda Punt, MD [address] [place] [address]  Iron deficiency anemia - Plan: ferumoxytol (FERAHEME) 1,020 mg in sodium chloride 0.9 % 100 mL IVPB, CBC with Differential, Iron and TIBC, Ferritin, CBC with Differential, Iron and TIBC, Ferritin  Weakness - Plan: Lactate dehydrogenase, Sedimentation rate, LYME IGG/IGM AB, Cortisol, TSH, Hemoglobin A1c, C-reactive protein, Copper, serum, Vitamin D 25 hydroxy, Rocky mtn spotted fvr abs pnl(IgG+IgM), ANA, Rheumatoid factor  CURRENT THERAPY: Ferrous sulfate 325 mg PO daily.  INTERVAL HISTORY: Amanda Osborne 62 y.o. female returns for  regular  visit for followup of iron deficiency anemia, secondary to partial gastric narrowing procedure in 1981 with attachment of small bowel to the stomach narrowed area.  I personally reviewed and went over laboratory results with the patient.  Her Hgb remains WNL at 14.8, but her TIBC has increased to 345, ferritin down to 32, and % saturation is low at 13%.  As a result of this lab work, we spent time today discussing the role of IV Feraheme.  She was encouraged to D/C ferrous sulfate PO.  We discussed the risks, benefits, alternatives, and side effects of Feraheme.  She is agreeable to this infusion and will take place this week or next week.   I spent a significant amount of time listening to the patient's story and complaints dating back to May 2013.  May 2013: She was gardening at her father's house and she noticed a pain underneath her right shoulder blade the next morning. Movement increased the right shoulder pain and she also had episodes of "heat and sweating of entire body." She followed up with her primary care physician Dr. Gerda Osborne, provided her pain medications and steroids.  July 2013: Patient reports she was still feeling poorly and at that time she was found to be iron deficient by primary care physician. She was placed on oral ferrous sulfate.  December 2013: She returned to  Dr. Gerda Osborne for further laboratory work after starting ferrous sulfate. It was at that time she was referred to the [facility] cancer [facility].  January 2014: There was one day in this month where she felt better but since that day, she began declining once again. Her stamina decrease and she had increased fatigue. She reports that she had an overwhelming sensation in knowing that she had activities that had to be completed.  Present day: She reports it she continues to have decreased stamina and is overwhelmed with fatigue. She has "waves of exhaustion." She also reports "foggy head."  In addition to exhaustion, she has flulike aches and throbbing in muscles and joints. She also has episodes of " head sweating" sparing other parts of her body.  In addition to the aforementioned, her father who is a patient of ours, Amanda Osborne, was recently diagnosed with colon cancer. She had a falling out with her father back in December but more recently rekindled their relationship.  However, his diagnosis and the strain on the relationship has been causing her some increased personal stress and potentially depression.  We spent some time discussing depression and its possible physical manifestations. We both agree that this may not be the root cause of her issues since her complaints date back before December of 2013, but I am confident is playing a part in her condition.  She denies any tick bites. She does have a parrot at home.  She denies any other pets at home.  So I have this long history of nonspecific complaints. She denies  any fevers, chills, or night sweats. She is not sure that all of her complaints are related to her iron deficiency and I tend to agree with that. There are instances where a particular patients are truly sensitive to iron deficiency despite a lack of anemia.  Developed a plan with the patient which includes IV Feraheme within the next 2 weeks. Today she comes in for IV Feraheme,  perform some laboratory work described below. Again the information provided above is nonspecific from a hematologic or oncologic standpoint. She was encouraged to followup with her primary care physician which she will do.   Past Medical History  Diagnosis Date  . Wrist fracture, bilateral 2005 and 2010    right then left  . Cervical disc herniation     c5 and c6  . Narcolepsy   . Occipital neuralgia   . Iron deficiency anemia 11/30/2012    has Iron deficiency anemia on her problem list.     has No Known Allergies.  Amanda Osborne does not currently have medications on file.  Past Surgical History  Procedure Laterality Date  . Anterior cervical decomp/discectomy fusion  2000  . Gastric bypass  1981  . Bone graft hip iliac crest  2006    to right wrist to correct fracture    Denies any headaches, dizziness, double vision, fevers, chills, night sweats, nausea, vomiting, diarrhea, constipation, chest pain, heart palpitations, shortness of breath, blood in stool, black tarry stool, urinary pain, urinary burning, urinary frequency, hematuria.   PHYSICAL EXAMINATION  ECOG PERFORMANCE STATUS: 2 - Symptomatic, <50% confined to bed  Filed Vitals:   03/19/13 1100  BP: 101/69  Pulse: 75  Temp: 98.3 F (36.8 C)  Resp: 16    GENERAL:alert, no distress, well nourished, well developed, comfortable, cooperative, smiling and truncal obesity SKIN: skin color, texture, turgor are normal, no rashes or significant lesions HEAD: Normocephalic, No masses, lesions, tenderness or abnormalities EYES: normal, Conjunctiva are pink and non-injected EARS: External ears normal OROPHARYNX:mucous membranes are moist  NECK: supple, no adenopathy, thyroid normal size, non-tender, without nodularity, no stridor, non-tender, trachea midline LYMPH:  no palpable lymphadenopathy BREAST:not examined LUNGS: clear to auscultation and percussion HEART: regular rate & rhythm, no murmurs, no gallops, S1 normal  and S2 normal ABDOMEN:abdomen soft, non-tender, obese and normal bowel sounds, truncal obesity BACK: Back symmetric, no curvature. EXTREMITIES:less then 2 second capillary refill, no joint deformities, effusion, or inflammation, no edema, no skin discoloration, no clubbing, no cyanosis  NEURO: alert & oriented x 3 with fluent speech, no focal motor/sensory deficits, gait normal   LABORATORY DATA: CBC    Component Value Date/Time   WBC 3.8* 03/11/2013 1023   RBC 4.78 03/11/2013 1023   HGB 14.8 03/11/2013 1023   HCT 43.3 03/11/2013 1023   PLT 280 03/11/2013 1023   MCV 90.6 03/11/2013 1023   MCH 31.0 03/11/2013 1023   MCHC 34.2 03/11/2013 1023   RDW 13.3 03/11/2013 1023   LYMPHSABS 1.0 11/30/2012 1540   MONOABS 0.5 11/30/2012 1540   EOSABS 0.3 11/30/2012 1540   BASOSABS 0.0 11/30/2012 1540    Lab Results  Component Value Date   IRON 52 03/11/2013   TIBC 397 03/11/2013   FERRITIN 32 03/11/2013      ASSESSMENT:  1.  Iron deficiency anemia, secondary to partial gastric narrowing procedure in 1981 with attachment of small bowel to the stomach narrowed area. On ferrous sulfate PO 325 mg daily but with worsening iron studies results.  2.  Large list of nonspecific complaints.  Will perform baseline labs. 3. Poor tolerability to ferrous sulfate with constipation and nausea.   PLAN:  1. I personally reviewed and went over laboratory results with the patient. 2. Discussion regarding IV Feraheme 3. Discussion regarding the risks, benefits, alternatives, and side effects of IV Feraheme 4. Labs in 1 month and 3 months: CBC diff, Iron/TIBC, Ferritin 5. Feraheme this week or next week. 6. Labs the day of Feraheme: LDH, ESR, CRP, Lyme IgG/IgM, Cortisol, TSH, Hgb A1C, Copper level, Vit D Level, Rocky mountain spotted fever antibody panel, ANA and RF 7. Patient education regarding the role on mental health on physical health. 8. Patient recommended to follow-up with PCP 9. D/C Ferrous sulfate 10. Return  in 3-4 months for follow-up  All questions were answered. The patient knows to call the clinic with any problems, questions or concerns. We can certainly see the patient much sooner if necessary.  The patient and plan discussed with Glenford Peers, MD and he is in agreement with the aforementioned.  I spent 30 minutes counseling the patient face to face. The total time spent in the appointment was 40 minutes.  KEFALAS,THOMAS

## 2013-03-19 NOTE — Patient Instructions (Addendum)
[facility] Discharge Instructions  RECOMMENDATIONS MADE BY THE CONSULTANT AND ANY TEST RESULTS WILL BE SENT TO YOUR REFERRING PHYSICIAN.  EXAM FINDINGS BY THE PHYSICIAN TODAY AND SIGNS OR SYMPTOMS TO REPORT TO CLINIC OR PRIMARY PHYSICIAN: exam and discussion by PA.  Will get you scheduled for a Feraheme infusion to see if it will improve your sypmtoms.  MEDICATIONS PRESCRIBED:  none  INSTRUCTIONS GIVEN AND DISCUSSED: Report increased fatigue or shortness of breath.  SPECIAL INSTRUCTIONS/FOLLOW-UP: Feraheme infusion, blood work 1 month and 3 months later and to be seen in follow-up in 3 - 4 months.  Thank you for choosing [facility] to provide your oncology and hematology care.  To afford each patient quality time with our providers, please arrive at least 15 minutes before your scheduled appointment time.  With your help, our goal is to use those 15 minutes to complete the necessary work-up to ensure our physicians have the information they need to help with your evaluation and healthcare recommendations.    Effective January 1st, 2014, we ask that you re-schedule your appointment with our physicians should you arrive 10 or more minutes late for your appointment.  We strive to give you quality time with our providers, and arriving late affects you and other patients whose appointments are after yours.    Again, thank you for choosing [facility].  Our hope is that these requests will decrease the amount of time that you wait before being seen by our physicians.       _____________________________________________________________  Should you have questions after your visit to [facility], please contact our office at [phone] between the hours of 8:30 a.m. and 5:00 p.m.  Voicemails left after 4:30 p.m. will not be returned until the following business day.  For prescription refill requests, have your pharmacy contact our office  with your prescription refill request.

## 2013-03-21 ENCOUNTER — Encounter (HOSPITAL_COMMUNITY): Payer: Medicare Other | Attending: Oncology

## 2013-03-21 VITALS — BP 100/62 | HR 70 | Temp 97.9°F | Resp 16

## 2013-03-21 DIAGNOSIS — R5383 Other fatigue: Secondary | ICD-10-CM | POA: Insufficient documentation

## 2013-03-21 DIAGNOSIS — R531 Weakness: Secondary | ICD-10-CM

## 2013-03-21 DIAGNOSIS — D509 Iron deficiency anemia, unspecified: Secondary | ICD-10-CM | POA: Insufficient documentation

## 2013-03-21 DIAGNOSIS — R5381 Other malaise: Secondary | ICD-10-CM | POA: Insufficient documentation

## 2013-03-21 DIAGNOSIS — E559 Vitamin D deficiency, unspecified: Secondary | ICD-10-CM | POA: Insufficient documentation

## 2013-03-21 LAB — HEMOGLOBIN A1C: Hgb A1c MFr Bld: 5 % (ref <5.7)

## 2013-03-21 LAB — RHEUMATOID FACTOR: Rhuematoid fact SerPl-aCnc: <10 IU/mL (ref <=14)

## 2013-03-21 LAB — LACTATE DEHYDROGENASE: LDH: 145 U/L (ref 94–250)

## 2013-03-21 MED ORDER — SODIUM CHLORIDE 0.9 % IV SOLN
1020.0000 mg | Freq: Once | INTRAVENOUS | Status: AC
  Administered 2013-03-21: 1020 mg via INTRAVENOUS
  Filled 2013-03-21: qty 34

## 2013-03-21 MED ORDER — SODIUM CHLORIDE 0.9 % IJ SOLN
10.0000 mL | INTRAMUSCULAR | Status: DC | PRN
  Administered 2013-03-21: 10 mL via INTRAVENOUS
  Filled 2013-03-21: qty 10

## 2013-03-21 MED ORDER — SODIUM CHLORIDE 0.9 % IV SOLN
INTRAVENOUS | Status: DC
  Administered 2013-03-21: 14:00:00 via INTRAVENOUS

## 2013-03-23 LAB — COPPER, SERUM: Copper: 76 ug/dL (ref 70–175)

## 2013-03-26 ENCOUNTER — Other Ambulatory Visit (HOSPITAL_COMMUNITY): Payer: Self-pay | Admitting: Oncology

## 2013-03-26 ENCOUNTER — Encounter (HOSPITAL_COMMUNITY): Payer: Self-pay | Admitting: Oncology

## 2013-03-26 ENCOUNTER — Encounter: Payer: Self-pay | Admitting: *Deleted

## 2013-03-26 DIAGNOSIS — E559 Vitamin D deficiency, unspecified: Secondary | ICD-10-CM

## 2013-03-26 HISTORY — DX: Vitamin D deficiency, unspecified: E55.9

## 2013-03-29 ENCOUNTER — Encounter: Payer: Self-pay | Admitting: Family Medicine

## 2013-03-29 ENCOUNTER — Ambulatory Visit (INDEPENDENT_AMBULATORY_CARE_PROVIDER_SITE_OTHER): Payer: Medicare Other | Admitting: Family Medicine

## 2013-03-29 VITALS — BP 108/70 | Temp 97.6°F | Wt 208.6 lb

## 2013-03-29 DIAGNOSIS — D649 Anemia, unspecified: Secondary | ICD-10-CM

## 2013-03-29 DIAGNOSIS — F411 Generalized anxiety disorder: Secondary | ICD-10-CM

## 2013-03-29 DIAGNOSIS — G8929 Other chronic pain: Secondary | ICD-10-CM | POA: Insufficient documentation

## 2013-03-29 DIAGNOSIS — F418 Other specified anxiety disorders: Secondary | ICD-10-CM | POA: Insufficient documentation

## 2013-03-29 DIAGNOSIS — F341 Dysthymic disorder: Secondary | ICD-10-CM

## 2013-03-29 DIAGNOSIS — Z9884 Bariatric surgery status: Secondary | ICD-10-CM

## 2013-03-29 DIAGNOSIS — D509 Iron deficiency anemia, unspecified: Secondary | ICD-10-CM

## 2013-03-29 DIAGNOSIS — M549 Dorsalgia, unspecified: Secondary | ICD-10-CM

## 2013-03-29 MED ORDER — CITALOPRAM HYDROBROMIDE 20 MG PO TABS: 20.0000 mg | ORAL_TABLET | Freq: Every day | ORAL | Status: DC

## 2013-03-29 MED ORDER — OXYCODONE-ACETAMINOPHEN 5-325 MG PO TABS: ORAL_TABLET | ORAL | Status: DC

## 2013-03-29 MED ORDER — LISINOPRIL 5 MG PO TABS: 5.0000 mg | ORAL_TABLET | Freq: Every day | ORAL | Status: DC

## 2013-03-29 MED ORDER — OXYCODONE-ACETAMINOPHEN 5-325 MG PO TABS: 1.0000 | ORAL_TABLET | Freq: Four times a day (QID) | ORAL | Status: DC | PRN

## 2013-03-29 MED ORDER — ALPRAZOLAM 0.5 MG PO TABS: 0.5000 mg | ORAL_TABLET | Freq: Three times a day (TID) | ORAL | Status: DC | PRN

## 2013-03-29 MED ORDER — HYDROCHLOROTHIAZIDE 25 MG PO TABS: 25.0000 mg | ORAL_TABLET | Freq: Every day | ORAL | Status: DC

## 2013-03-29 NOTE — Progress Notes (Signed)
  Subjective:    Patient ID: Amanda Osborne, female    DOB: 01-07-1951, 62 y.o.   MRN: [id number]  HPIShe's had a very rough go over the past few weeks she is seeing the oncologist several times for iron deficient anemia and do puzzles her why she is having such trouble with it. She also was diagnosed with vitamin D deficiency and they are treating her for this currently   Unusual head sensation- left side dailyShe describes the discomfort on the left side of her head as a funny sensation that originates in the back of the head goes across the top of the head into the left temporal region no blurred vision or vomiting with it. High anxiety / some anger / difficult around people-She's been very stressed she finds herself short with being around people. She prefers to be by herself she denies being suicidal or homicidal. Task overwhelms-She states even simple tasks overwhelm her and she has a hard time motivating herself to get things done. Has had sickness with anti-depressions-In the past she's had sickness with antidepressants and she is hesitant to be on some Her father has colon cancer and is doing radiation therapy and operable. In addition to this she has had a gastric bypass in the past  Review of SystemsROS see above please negative for chest pain shortness of breath vomiting diarrhea bloody stools     Objective:   Physical Exam  Vital signs stable. Lungs are clear no crackles. Heart is regular. Pulse normal. Extremities no edema. Skin warm dry. Abdomen soft      Assessment & Plan:  Iron def anemia- colonoscopy rec/ pt defers due to cost/ she will check into cost. Muscle and joint pain-She will use pain medication as necessary. She does not abuse it does help her function. A Neff medication was given to last her for 3 months. Wellness pe in the future Depression-Celexa as prescribed half tablet for the first 1-2 weeks then one tablet thereafter followup again in approximately one  month to see how this is doing

## 2013-03-29 NOTE — Patient Instructions (Signed)
i recommend a colonoscopy

## 2013-04-18 ENCOUNTER — Encounter (HOSPITAL_BASED_OUTPATIENT_CLINIC_OR_DEPARTMENT_OTHER): Payer: Medicare Other

## 2013-04-18 DIAGNOSIS — D509 Iron deficiency anemia, unspecified: Secondary | ICD-10-CM

## 2013-04-18 DIAGNOSIS — E559 Vitamin D deficiency, unspecified: Secondary | ICD-10-CM

## 2013-04-18 DIAGNOSIS — R531 Weakness: Secondary | ICD-10-CM

## 2013-04-18 LAB — CBC WITH DIFFERENTIAL/PLATELET
Eosinophils Absolute: 0.7 10*3/uL (ref 0.0–0.7)
HCT: 46.1 % — ABNORMAL HIGH (ref 36.0–46.0)
Hemoglobin: 15.8 g/dL — ABNORMAL HIGH (ref 12.0–15.0)
Lymphs Abs: 1.5 10*3/uL (ref 0.7–4.0)
MCH: 31 pg (ref 26.0–34.0)
MCV: 90.6 fL (ref 78.0–100.0)
Monocytes Relative: 8 % (ref 3–12)
Neutrophils Relative %: 52 % (ref 43–77)
RBC: 5.09 MIL/uL (ref 3.87–5.11)

## 2013-04-18 LAB — FERRITIN: Ferritin: 240 ng/mL (ref 10–291)

## 2013-04-18 LAB — IRON AND TIBC
Iron: 71 ug/dL (ref 42–135)
Saturation Ratios: 20 % (ref 20–55)
TIBC: 359 ug/dL (ref 250–470)

## 2013-04-18 LAB — VITAMIN D 25 HYDROXY (VIT D DEFICIENCY, FRACTURES): Vit D, 25-Hydroxy: 56 ng/mL (ref 30–89)

## 2013-04-18 NOTE — Progress Notes (Signed)
Labs drawn today for ANA,cbc/diff,ferr,Vit D 25

## 2013-05-01 ENCOUNTER — Ambulatory Visit (INDEPENDENT_AMBULATORY_CARE_PROVIDER_SITE_OTHER): Payer: Medicare Other | Admitting: Nurse Practitioner

## 2013-05-01 ENCOUNTER — Encounter: Payer: Self-pay | Admitting: Nurse Practitioner

## 2013-05-01 VITALS — BP 117/70 | HR 70 | Ht 67.0 in | Wt 210.0 lb

## 2013-05-01 DIAGNOSIS — Z1239 Encounter for other screening for malignant neoplasm of breast: Secondary | ICD-10-CM

## 2013-05-01 DIAGNOSIS — M40209 Unspecified kyphosis, site unspecified: Secondary | ICD-10-CM

## 2013-05-01 DIAGNOSIS — Z01419 Encounter for gynecological examination (general) (routine) without abnormal findings: Secondary | ICD-10-CM

## 2013-05-01 DIAGNOSIS — Z0000 Encounter for general adult medical examination without abnormal findings: Secondary | ICD-10-CM

## 2013-05-01 DIAGNOSIS — E785 Hyperlipidemia, unspecified: Secondary | ICD-10-CM

## 2013-05-01 DIAGNOSIS — M4000 Postural kyphosis, site unspecified: Secondary | ICD-10-CM

## 2013-05-01 DIAGNOSIS — Z124 Encounter for screening for malignant neoplasm of cervix: Secondary | ICD-10-CM

## 2013-05-01 NOTE — Progress Notes (Signed)
  Subjective:    Patient ID: Amanda Osborne, female    DOB: 1951/10/16, 62 y.o.   MRN: [id number]  HPI   HPI presents for her wellness checkup. Has not had any vaginal bleeding since she was 51. No pelvic pain. No vaginal discharge. See several specialists. Had a colonoscopy in 2009, not due for another one for 10 years. Is on vitamin D 10,000 units a day. Some problems with anemia, recently had an iron infusion. States she cannot afford optometry or dental care at this point. Reflux is stable, only bothers her with certain foods such as hot peppers. Patient had gastric bypass in 1981, her weight has been stable for several years. Continues to have issues with chronic fatigue.   Review of Systems  Constitutional: Positive for fatigue. Negative for activity change, appetite change and unexpected weight change.  HENT: Negative for dental problem.   Eyes: Negative for visual disturbance.  Respiratory: Negative for cough, chest tightness and shortness of breath.   Cardiovascular: Negative for chest pain.  Gastrointestinal: Negative for nausea, vomiting, abdominal pain, diarrhea, constipation, blood in stool and abdominal distention.  Genitourinary: Negative for dysuria, urgency, frequency, vaginal bleeding, vaginal discharge, difficulty urinating and pelvic pain.       Objective:   Physical Exam  Constitutional: She is oriented to person, place, and time. She appears well-developed. No distress.  HENT:  Right Ear: External ear normal.  Left Ear: External ear normal.  Mouth/Throat: Oropharynx is clear and moist.  Eyes: Conjunctivae and EOM are normal. Pupils are equal, round, and reactive to light.  Neck: Normal range of motion. Neck supple. No tracheal deviation present. No thyromegaly present.  Cardiovascular: Normal rate, regular rhythm and normal heart sounds.  Exam reveals no gallop.   No murmur heard. Pulmonary/Chest: Effort normal and breath sounds normal.  Abdominal: Soft. She  exhibits no distension. There is no tenderness.  Genitourinary: Vagina normal and uterus normal. No vaginal discharge found.  Musculoskeletal: She exhibits no edema.  Lymphadenopathy:    She has no cervical adenopathy.  Neurological: She is alert and oriented to person, place, and time.  Skin: Skin is warm and dry. No rash noted.  Psychiatric: She has a normal mood and affect. Her behavior is normal.   Breast: No masses. Axilla no adenopathy. External GU signs of hypoestrogenism. Vagina pale and moist. Bimanual exam: No obvious masses, nontender. Exam limited due to abdominal girth. Significant kyphosis noted on exam.        Assessment & Plan:  Well woman exam - Plan: Pap IG w/ reflex to HPV when ASC-U  Kyphosis - Plan: DG Bone Density  Screening for breast cancer - Plan: MM Digital Screening  Screening for cervical cancer - Plan: Pap IG w/ reflex to HPV when ASC-U  Hyperlipidemia - Plan: Lipid panel  mammogram scheduled Continue calcium and vitamin D. Recommend activities such as walking. Recommend dental and vision exams if she can afford them. Next physical in one year.

## 2013-05-01 NOTE — Progress Notes (Signed)
  Subjective:    Patient ID: Amanda Osborne, female    DOB: Sep 19, 1951, 62 y.o.   MRN: [id number]  HPI presents for her wellness checkup. Has not had any vaginal bleeding since she was 51. No pelvic pain. No vaginal discharge. See several specialists. Had a colonoscopy in 2009, not due for another one for 10 years. Is on vitamin D 10,000 units a day. Some problems with anemia, recently had an iron infusion. States she cannot afford optometry or dental care at this point. Reflux is stable, only bothers her with certain foods such as hot peppers. Patient had gastric bypass in 1981, her weight has been stable for several years.    Review of Systems     Objective:   Physical Exam        Assessment & Plan:  pt has mammogram app scheduled at [facility] May 07, 2013 10:30

## 2013-05-02 ENCOUNTER — Telehealth: Payer: Self-pay | Admitting: Family Medicine

## 2013-05-02 DIAGNOSIS — E785 Hyperlipidemia, unspecified: Secondary | ICD-10-CM | POA: Insufficient documentation

## 2013-05-02 DIAGNOSIS — M40209 Unspecified kyphosis, site unspecified: Secondary | ICD-10-CM | POA: Insufficient documentation

## 2013-05-02 LAB — PAP IG W/ RFLX HPV ASCU

## 2013-05-02 NOTE — Assessment & Plan Note (Addendum)
Bone density study ordered. Continue calcium and vitamin D supplement.

## 2013-05-02 NOTE — Assessment & Plan Note (Signed)
Lipid profile pending.

## 2013-05-02 NOTE — Telephone Encounter (Signed)
Patient requesting referral to Dr. Danielle Dess.  She's seen him before for C-spine fusion.  She's having neck pain and pressure, numbness down right arm.  She states you recommended this referral back with her most recent MRI and she declined.  Has now decided to move forward with seeing Dr. Danielle Dess.  If "ok" please initiate referral in system so that I may proceed

## 2013-05-03 ENCOUNTER — Other Ambulatory Visit: Payer: Self-pay | Admitting: Family Medicine

## 2013-05-03 DIAGNOSIS — M5090 Cervical disc disorder, unspecified, unspecified cervical region: Secondary | ICD-10-CM

## 2013-05-03 NOTE — Telephone Encounter (Signed)
Referral initiated

## 2013-05-07 ENCOUNTER — Ambulatory Visit (HOSPITAL_COMMUNITY)
Admission: RE | Admit: 2013-05-07 | Discharge: 2013-05-07 | Disposition: A | Discharge status: Home / Self Care | Payer: Medicare Other | Source: Ambulatory Visit | Attending: Nurse Practitioner | Admitting: Nurse Practitioner

## 2013-05-07 DIAGNOSIS — Z1239 Encounter for other screening for malignant neoplasm of breast: Secondary | ICD-10-CM

## 2013-05-07 DIAGNOSIS — Z1231 Encounter for screening mammogram for malignant neoplasm of breast: Secondary | ICD-10-CM | POA: Insufficient documentation

## 2013-05-15 ENCOUNTER — Other Ambulatory Visit: Payer: Self-pay | Admitting: *Deleted

## 2013-05-15 ENCOUNTER — Telehealth: Payer: Self-pay | Admitting: *Deleted

## 2013-05-15 DIAGNOSIS — M40209 Unspecified kyphosis, site unspecified: Secondary | ICD-10-CM

## 2013-05-15 NOTE — Telephone Encounter (Signed)
Spoke with patient has bone density scheduled for May 20 2013 at 10:00 am

## 2013-05-20 ENCOUNTER — Ambulatory Visit (HOSPITAL_COMMUNITY)
Admission: RE | Admit: 2013-05-20 | Discharge: 2013-05-20 | Disposition: A | Discharge status: Home / Self Care | Payer: Medicare Other | Source: Ambulatory Visit | Attending: Nurse Practitioner | Admitting: Nurse Practitioner

## 2013-05-20 DIAGNOSIS — Z780 Asymptomatic menopausal state: Secondary | ICD-10-CM | POA: Insufficient documentation

## 2013-05-20 DIAGNOSIS — E559 Vitamin D deficiency, unspecified: Secondary | ICD-10-CM | POA: Insufficient documentation

## 2013-05-20 DIAGNOSIS — M818 Other osteoporosis without current pathological fracture: Secondary | ICD-10-CM | POA: Insufficient documentation

## 2013-05-23 ENCOUNTER — Telehealth: Payer: Self-pay | Admitting: Nurse Practitioner

## 2013-05-23 MED ORDER — ALENDRONATE SODIUM 70 MG PO TABS: 70.0000 mg | ORAL_TABLET | ORAL | Status: DC

## 2013-05-23 NOTE — Telephone Encounter (Signed)
Notified patient bone density study 05/20/13 indicates osteoporosis. Recommend starting Fosamax weekly with repeat test in 2 years. RX faxed into pharmacy. Patient verbalized understanding.

## 2013-05-23 NOTE — Telephone Encounter (Signed)
Bone density study 05/20/13 indicates osteoporosis.  Recommend starting Fosamax weekly with repeat test in 2 years.

## 2013-05-30 ENCOUNTER — Telehealth: Payer: Self-pay | Admitting: Family Medicine

## 2013-05-30 NOTE — Telephone Encounter (Signed)
Obtained "cost tier sharing exception" for pt's Oxycodone/APAP 5-325 mg  Patient will be able to get this at the Tier 2 cost until 11/20/13.

## 2013-06-24 ENCOUNTER — Ambulatory Visit (INDEPENDENT_AMBULATORY_CARE_PROVIDER_SITE_OTHER): Payer: Medicare Other | Admitting: Family Medicine

## 2013-06-24 ENCOUNTER — Encounter: Payer: Self-pay | Admitting: Family Medicine

## 2013-06-24 VITALS — BP 124/80 | HR 70 | Ht 67.0 in | Wt 209.0 lb

## 2013-06-24 DIAGNOSIS — M549 Dorsalgia, unspecified: Secondary | ICD-10-CM

## 2013-06-24 DIAGNOSIS — Z9884 Bariatric surgery status: Secondary | ICD-10-CM

## 2013-06-24 DIAGNOSIS — D649 Anemia, unspecified: Secondary | ICD-10-CM

## 2013-06-24 DIAGNOSIS — D509 Iron deficiency anemia, unspecified: Secondary | ICD-10-CM

## 2013-06-24 DIAGNOSIS — G8929 Other chronic pain: Secondary | ICD-10-CM

## 2013-06-24 DIAGNOSIS — E785 Hyperlipidemia, unspecified: Secondary | ICD-10-CM

## 2013-06-24 MED ORDER — OXYCODONE-ACETAMINOPHEN 5-325 MG PO TABS: ORAL_TABLET | ORAL | Status: DC

## 2013-06-24 MED ORDER — LISINOPRIL 5 MG PO TABS: 5.0000 mg | ORAL_TABLET | Freq: Every day | ORAL | Status: DC

## 2013-06-24 MED ORDER — HYDROCHLOROTHIAZIDE 25 MG PO TABS: 25.0000 mg | ORAL_TABLET | Freq: Every day | ORAL | Status: DC

## 2013-06-24 NOTE — Progress Notes (Signed)
  Subjective:    Patient ID: Amanda Osborne, female    DOB: [birth year], 62 y.o.   MRN: [id number]  HPIHere for a check up.   Discuss taking fosamax. -Patient has concerns regarding Fosamax. She is concerned will cause esophageal trouble we talked about this at length. She agrees to try the medication and if it bothers her then she will stop it and notify us and we will go with IV therapy for her osteoporosis.  Requesting an anti inflammatory for neck pain.-has appt with Dr.Elsner Aug 13-patient is at risk for gastric ulcer related to her gastric bypass I really don't think it would be a good idea for her to be on any type anti-inflammatory. We discussed that at length. Tries stretching nad massage  Refill on oxycodone.-Patient states his medication helps her with functioning. She denies abusing it. She takes it responsibly. Prescriptions were given for her she is to followup in 3 months.  Discuss MRI that was done last year. Talked about her MRI at length gave her a copy of the results. Discussed the importance of following through with neurosurgeon. Hopefully she won't need more surgery  Requesting rx for Nexium. Patient wonders if she might be a get Nexium through a patient assistance program we will go ahead and give her a prescription for this.  Past medical history B12 deficiency, significant arthritic changes in her neck plus previous surgeries chronic pain disorder in addition to this osteoporosis and hypertension Family history: Cancer. Patient had a colonoscopy in 2009    Review of Systems See above. Relates pain and neck pain in the mid back denies chest tightness pressure pain shortness breath some mild epigastric pain not severe    Objective:   Physical Exam Neck no masses subjective discomfort in the neck Lungs are clear no crackle Heart is regular Abdomen soft no guarding rebound extremities no edema       Assessment & Plan:  Chronic back pain-oxycodone 3  prescriptions given Blood pressure under decent control Osteoporosis medication Fosamax discussed she will use it if she's having any problems she'll let us know Reflux-if she would like to use Nexium she could she will check in to seen if she can get manufacturer support for giving this medicine See patient back in 3 months

## 2013-07-04 ENCOUNTER — Other Ambulatory Visit: Payer: Self-pay | Admitting: Neurological Surgery

## 2013-07-11 ENCOUNTER — Encounter (HOSPITAL_COMMUNITY): Payer: Medicare Other | Attending: Hematology and Oncology

## 2013-07-11 DIAGNOSIS — D509 Iron deficiency anemia, unspecified: Secondary | ICD-10-CM | POA: Insufficient documentation

## 2013-07-11 DIAGNOSIS — E559 Vitamin D deficiency, unspecified: Secondary | ICD-10-CM

## 2013-07-11 LAB — CBC WITH DIFFERENTIAL/PLATELET
Basophils Absolute: 0 10*3/uL (ref 0.0–0.1)
HCT: 40.7 % (ref 36.0–46.0)
Lymphocytes Relative: 24 % (ref 12–46)
Monocytes Absolute: 0.3 10*3/uL (ref 0.1–1.0)
Neutro Abs: 2.2 10*3/uL (ref 1.7–7.7)
RDW: 13.4 % (ref 11.5–15.5)
WBC: 3.8 10*3/uL — ABNORMAL LOW (ref 4.0–10.5)

## 2013-07-11 LAB — IRON AND TIBC
Iron: 46 ug/dL (ref 42–135)
Saturation Ratios: 16 % — ABNORMAL LOW (ref 20–55)
TIBC: 289 ug/dL (ref 250–470)
UIBC: 243 ug/dL (ref 125–400)

## 2013-07-11 NOTE — Progress Notes (Signed)
Labs drawn today for cbc/diff,Iron and IBC,ferr,VD25

## 2013-07-12 ENCOUNTER — Encounter (HOSPITAL_COMMUNITY): Payer: Self-pay | Admitting: Oncology

## 2013-07-12 ENCOUNTER — Encounter (HOSPITAL_BASED_OUTPATIENT_CLINIC_OR_DEPARTMENT_OTHER): Payer: Medicare Other | Admitting: Oncology

## 2013-07-12 ENCOUNTER — Other Ambulatory Visit: Payer: Self-pay | Admitting: *Deleted

## 2013-07-12 VITALS — BP 106/74 | HR 73 | Temp 97.6°F | Resp 15 | Wt 204.2 lb

## 2013-07-12 DIAGNOSIS — D509 Iron deficiency anemia, unspecified: Secondary | ICD-10-CM

## 2013-07-12 DIAGNOSIS — E559 Vitamin D deficiency, unspecified: Secondary | ICD-10-CM

## 2013-07-12 DIAGNOSIS — Z9884 Bariatric surgery status: Secondary | ICD-10-CM

## 2013-07-12 DIAGNOSIS — D508 Other iron deficiency anemias: Secondary | ICD-10-CM

## 2013-07-12 DIAGNOSIS — E041 Nontoxic single thyroid nodule: Secondary | ICD-10-CM

## 2013-07-12 NOTE — Patient Instructions (Addendum)
[facility] Discharge Instructions  RECOMMENDATIONS MADE BY THE CONSULTANT AND ANY TEST RESULTS WILL BE SENT TO YOUR REFERRING PHYSICIAN.  EXAM FINDINGS BY THE PHYSICIAN TODAY AND SIGNS OR SYMPTOMS TO REPORT TO CLINIC OR PRIMARY PHYSICIAN: Exam findings as discussed by T. Kefalas, PA-C.  SPECIAL INSTRUCTIONS/FOLLOW-UP: 1.  Please keep your appointments for labs every 2 months, and we plan to see you back in the office in 6 months.  Contact us sooner with questions or concerns.  Thank you for choosing [facility] to provide your oncology and hematology care.  To afford each patient quality time with our providers, please arrive at least 15 minutes before your scheduled appointment time.  With your help, our goal is to use those 15 minutes to complete the necessary work-up to ensure our physicians have the information they need to help with your evaluation and healthcare recommendations.    Effective January 1st, 2014, we ask that you re-schedule your appointment with our physicians should you arrive 10 or more minutes late for your appointment.  We strive to give you quality time with our providers, and arriving late affects you and other patients whose appointments are after yours.    Again, thank you for choosing [facility].  Our hope is that these requests will decrease the amount of time that you wait before being seen by our physicians.       _____________________________________________________________  Should you have questions after your visit to [facility], please contact our office at [phone] between the hours of 8:30 a.m. and 5:00 p.m.  Voicemails left after 4:30 p.m. will not be returned until the following business day.  For prescription refill requests, have your pharmacy contact our office with your prescription refill request.       Labs every 2 months and see back in next 6 months

## 2013-07-12 NOTE — Progress Notes (Signed)
[name], MD [address] [place] [address]  Iron deficiency anemia  Unspecified vitamin D deficiency  CURRENT THERAPY: IV Feraheme 1020 mg on 03/21/2013 with excellent response  INTERVAL HISTORY: MILAYA HORA 62 y.o. female returns for  regular  visit for followup of Iron deficiency anemia, secondary to partial gastric narrowing procedure in 1981 with attachment of small bowel to the stomach narrowed area. Poor PO absorption of ferrous sulfate and therefore we switched to IV Feraheme which was given on 03/21/2013.  She is due to have surgery on her back for vertebral disc on 08/05/2013.  I personally reviewed and went over laboratory results with the patient.  Her ferritin has dropped over the past 3 months to 150 (compared to 240) and % saturation is down to 16% (compared to 20%).  Hgb is WNL at 13.6 g/dL (but even this is down from 15.8 g/dL).  Her Vit D level has increased to 67 which is WNL.   I personally reviewed and went over radiographic studies with the patient.  Her mammogram in June 2014 was BIRADS 1.  Next screening mammogram will be due in June 2015.  Again the patient reports a number of chronic complaints for her.  Mainly, her energy is significantly decreased.  She relays the story to me dictated in my note dated 03/19/2013.  In short, at the end of May- beginning of June 2013, the patient experienced a significant decrease in energy.  Her work-up was negative, except for iron deficiency.  She was subsequently placed on PO iron without significant improvement in iron studies over a 8-12 week timespan.  Therefore, we discontinued the medication and provided her a 1020 mg IV Feraheme infusion which corrected her iron studies.  She was also noted in our clinic to have a Low Vit D level, copper level was WNL.   Her Vit D level is WNL and therefore I have asked her to take Vit D 10,000 U every other day (compared to daily) at the patient inquisition.   As  noted in previous note, she continues to crave "acidic food" to the point that it is an obsession or addiction.  She reports that she craves vinegar, pickle juice, tomatoe juice.  She reports that her addiction is satisfied once she consumes the solution.  I wonder if she has a sodium craving in lieu of acidic craving.  This is odd, and I am unable to provide her a source etiology of this craving.  I cannot think of a physiologic cause for this except possible a cranial biochemical issue or mental issue that can be found in drug addicts.  However, I am only surmising.  Hematologically, she denies any complaints and ROS questioning is negative.    Past Medical History  Diagnosis Date  . Wrist fracture, bilateral 2005 and 2010    right then left  . Cervical disc herniation     c5 and c6  . Narcolepsy   . Occipital neuralgia   . Iron deficiency anemia 11/30/2012  . Unspecified vitamin D deficiency 03/26/2013  . Chronic pain   . Fibromyalgia     has Iron deficiency anemia; Unspecified vitamin D deficiency; Chronic back pain; Generalized anxiety disorder; Depression with anxiety; H/O gastric bypass; Kyphosis; and Hyperlipidemia on her problem list.     is allergic to celexa and neurontin.  Ms. Heckert had no medications administered during this visit.  Past Surgical History  Procedure Laterality Date  . Anterior  cervical decomp/discectomy fusion  2000  . Gastric bypass  1981  . Bone graft hip iliac crest  2006    to right wrist to correct fracture    Denies any headaches, dizziness, double vision, fevers, chills, night sweats, nausea, vomiting, diarrhea, constipation, chest pain, heart palpitations, shortness of breath, blood in stool, black tarry stool, urinary pain, urinary burning, urinary frequency, hematuria.   PHYSICAL EXAMINATION  ECOG PERFORMANCE STATUS: 1 - Symptomatic but completely ambulatory  Filed Vitals:   07/12/13 1447  BP: 106/74  Pulse: 73  Temp: 97.6 F (36.4 C)    Resp: 15    GENERAL:alert, no distress, well nourished, well developed, comfortable, cooperative, truncal obese, and flat and saddened affect SKIN: skin color, texture, turgor are normal, no rashes or significant lesions HEAD: Normocephalic, No masses, lesions, tenderness or abnormalities EYES: normal, PERRLA, EOMI, Conjunctiva are pink and non-injected EARS: External ears normal OROPHARYNX:mucous membranes are moist  NECK: supple, no adenopathy, thyroid normal size, non-tender, without nodularity, no stridor, non-tender, trachea midline LYMPH:  not examined BREAST:not examined LUNGS: clear to auscultation  HEART: regular rate & rhythm, no murmurs, no gallops, S1 normal and S2 normal ABDOMEN:abdomen soft, non-tender, obese and normal bowel sounds BACK: Back symmetric, no curvature. EXTREMITIES:less then 2 second capillary refill, no joint deformities, effusion, or inflammation, no skin discoloration, no clubbing, no cyanosis  NEURO: alert & oriented x 3 with fluent speech, no focal motor/sensory deficits, gait normal    LABORATORY DATA: CBC    Component Value Date/Time   WBC 3.8* 07/11/2013 0922   RBC 4.49 07/11/2013 0922   RBC 5.02 11/30/2012 1540   HGB 13.6 07/11/2013 0922   HCT 40.7 07/11/2013 0922   PLT 325 07/11/2013 0922   MCV 90.6 07/11/2013 0922   MCH 30.3 07/11/2013 0922   MCHC 33.4 07/11/2013 0922   RDW 13.4 07/11/2013 0922   LYMPHSABS 0.9 07/11/2013 0922   MONOABS 0.3 07/11/2013 0922   EOSABS 0.3 07/11/2013 0922   BASOSABS 0.0 07/11/2013 0922    Lab Results  Component Value Date   IRON 46 07/11/2013   TIBC 289 07/11/2013   FERRITIN 150 07/11/2013      RADIOGRAPHIC STUDIES:  05/10/2013  *RADIOLOGY REPORT*  Clinical Data: Screening.  DIGITAL SCREENING BILATERAL MAMMOGRAM WITH CAD  Comparison: Previous exams.  FINDINGS:  ACR Breast Density Category b: There is a scattered fibroglandular  pattern.  There are no findings suspicious for malignancy.  Images were  processed with CAD.  IMPRESSION:  No mammographic evidence of malignancy.  A result letter of this screening mammogram will be mailed directly  to the patient.  RECOMMENDATION:  Screening mammogram in one year. (Code:SM-B-01Y)  BI-RADS CATEGORY 1: Negative.  Original Report Authenticated By: Christiana Pellant, M.D.     ASSESSMENT:  1. Iron deficiency anemia, secondary to partial gastric narrowing procedure in 1981 with attachment of small bowel to the stomach narrowed area. Poor PO absorption of ferrous sulfate and therefore we switched to IV Feraheme which was given on 03/21/2013 2. Large list of nonspecific complaints.  3. Poor tolerability to ferrous sulfate with constipation and nausea. 4. Acidic or sodium craving, severe 5. Fatigue  Patient Active Problem List   Diagnosis Date Noted  . Kyphosis 05/02/2013  . Hyperlipidemia 05/02/2013  . Chronic back pain 03/29/2013  . Generalized anxiety disorder 03/29/2013  . Depression with anxiety 03/29/2013  . H/O gastric bypass 03/29/2013  . Unspecified vitamin D deficiency 03/26/2013  . Iron deficiency anemia 11/30/2012  PLAN:  1. I personally reviewed and went over laboratory results with the patient. 2. I personally reviewed and went over radiographic studies with the patient. 3. Labs in 2 months, 4 months, and 6 months: CBC diff, Iron/TIBC, Ferritin 4. Will anticipate IV Feraheme in 2 months time.  5.  Next screening mammogram will be due in June 2015. 6. Recommended continued follow-up by PCP 7. Return in 6 months.   THERAPY PLAN:  We will continue to monitor her labs and check iron studies.  I suspect she will need Feraheme IV again in 2-4 months.  All questions were answered. The patient knows to call the clinic with any problems, questions or concerns. We can certainly see the patient much sooner if necessary.  Patient and plan discussed with Dr. Erline Hau and he is in agreement with the aforementioned.    Amanda Osborne

## 2013-07-23 ENCOUNTER — Ambulatory Visit (HOSPITAL_COMMUNITY): Payer: Medicare Other

## 2013-07-25 ENCOUNTER — Ambulatory Visit (HOSPITAL_COMMUNITY)
Admission: RE | Admit: 2013-07-25 | Discharge: 2013-07-25 | Disposition: A | Discharge status: Home / Self Care | Payer: Medicare Other | Source: Ambulatory Visit | Attending: Family Medicine | Admitting: Family Medicine

## 2013-07-25 DIAGNOSIS — E041 Nontoxic single thyroid nodule: Secondary | ICD-10-CM | POA: Insufficient documentation

## 2013-07-29 ENCOUNTER — Encounter (HOSPITAL_COMMUNITY): Payer: Self-pay | Admitting: Respiratory Therapy

## 2013-07-30 ENCOUNTER — Encounter (HOSPITAL_COMMUNITY)
Admission: RE | Admit: 2013-07-30 | Discharge: 2013-07-30 | Disposition: A | Discharge status: Home / Self Care | Payer: Medicare Other | Source: Ambulatory Visit | Attending: Anesthesiology | Admitting: Anesthesiology

## 2013-07-30 ENCOUNTER — Encounter (HOSPITAL_COMMUNITY)
Admission: RE | Admit: 2013-07-30 | Discharge: 2013-07-30 | Disposition: A | Discharge status: Home / Self Care | Payer: Medicare Other | Source: Ambulatory Visit | Attending: Neurological Surgery | Admitting: Neurological Surgery

## 2013-07-30 ENCOUNTER — Encounter (HOSPITAL_COMMUNITY): Payer: Self-pay

## 2013-07-30 DIAGNOSIS — Z01818 Encounter for other preprocedural examination: Secondary | ICD-10-CM | POA: Insufficient documentation

## 2013-07-30 DIAGNOSIS — Z01812 Encounter for preprocedural laboratory examination: Secondary | ICD-10-CM | POA: Insufficient documentation

## 2013-07-30 DIAGNOSIS — Z01810 Encounter for preprocedural cardiovascular examination: Secondary | ICD-10-CM | POA: Insufficient documentation

## 2013-07-30 HISTORY — DX: Nausea with vomiting, unspecified: R11.2

## 2013-07-30 HISTORY — DX: Essential (primary) hypertension: I10

## 2013-07-30 HISTORY — DX: Other complications of anesthesia, initial encounter: T88.59XA

## 2013-07-30 HISTORY — DX: Headache: R51

## 2013-07-30 HISTORY — DX: Anxiety disorder, unspecified: F41.9

## 2013-07-30 HISTORY — DX: Unspecified osteoarthritis, unspecified site: M19.90

## 2013-07-30 HISTORY — DX: Adverse effect of unspecified anesthetic, initial encounter: T41.45XA

## 2013-07-30 HISTORY — DX: Other specified postprocedural states: Z98.890

## 2013-07-30 HISTORY — DX: Gastro-esophageal reflux disease without esophagitis: K21.9

## 2013-07-30 LAB — SURGICAL PCR SCREEN: MRSA, PCR: NEGATIVE

## 2013-07-30 LAB — CBC
HCT: 39.6 % (ref 36.0–46.0)
MCH: 30.9 pg (ref 26.0–34.0)
MCHC: 33.8 g/dL (ref 30.0–36.0)
MCV: 91.5 fL (ref 78.0–100.0)
RDW: 13.9 % (ref 11.5–15.5)

## 2013-07-30 LAB — BASIC METABOLIC PANEL
BUN: 22 mg/dL (ref 6–23)
CO2: 23 mEq/L (ref 19–32)
Chloride: 102 mEq/L (ref 96–112)
Creatinine, Ser: 0.65 mg/dL (ref 0.50–1.10)
Glucose, Bld: 85 mg/dL (ref 70–99)

## 2013-07-30 NOTE — Progress Notes (Signed)
Pt denies SOB, chest pain, and being under the care of a cardiologist. Pt denies having an EKG and chest x ray within the last year. Pt denies having an echo, stress test, and cardiac cath. Pt chart left for [place], [place] (anesthesia) to review EKG dated 07/30/13.

## 2013-07-30 NOTE — Pre-Procedure Instructions (Addendum)
MONTE BRONDER  07/30/2013    Your procedure is scheduled on: Monday, August 05, 2013  Report to [facility] at 7:30 AM.  Call this number if you have problems the morning of surgery: [phone]   Remember:   Do not eat food or drink liquids after midnight.   Take these medicines the morning of surgery with A SIP OF WATER: famotidine (PEPCID) 20 MG tablet, if needed:  oxyCODONE-acetaminophen (PERCOCET/ROXICET) 5-325 MG per tablet for pain, ALPRAZolam (XANAX) 0.5 MG tablet for anxiety Stop taking Aspirin, Multivitamins, and herbal medications. Do not take any NSAIDs ie: Ibuprofen, Advil, Naproxen or any medication containing Aspirin ( Aspirin-Caffeine (BC FAST PAIN RELIEF) 845-65 MG PACK)   Do not wear jewelry, make-up or nail polish.  Do not wear lotions, powders, or perfumes. You may wear deodorant.  Do not shave 48 hours prior to surgery.  Do not bring valuables to the hospital.  [facility] is not responsible  for any belongings or valuables.  Contacts, dentures or bridgework may not be worn into surgery.  Leave suitcase in the car. After surgery it may be brought to your room.  For patients admitted to the hospital, checkout time is 11:00 AM the day of discharge.   Patients discharged the day of surgery will not be allowed to drive home.  Name and phone number of your driver:   Special Instructions: Shower using CHG 2 nights before surgery and the night before surgery.  If you shower the day of surgery use CHG.  Use special wash - you have one bottle of CHG for all showers.  You should use approximately 1/3 of the bottle for each shower.   Please read over the following fact sheets that you were given: Pain Booklet, Coughing and Deep Breathing, MRSA Information and Surgical Site Infection Prevention

## 2013-07-31 NOTE — Progress Notes (Signed)
Anesthesia chart review:  Patient is a 62 year old female scheduled for C5-6 ACDF on 08/05/13 by Dr. Danielle Dess.  History includes non-smoker, obesity, post-operative N/V, narcolepsy, iron deficiency anemia, HTN, fibromyalgia, GERD, anxiety, arthritis, headaches, occipital neuralgia, prior ACDF, gastric bypass, tonsillectomy. Hematologist is Dr. Mariel Sleet.  PCP is Dr. [name].   EKG on 07/30/13 showed NSR, septal infarct (age undetermined).  Her EKG was not felt significantly changed from her prior tracing on 06/11/08.  CXR on 07/30/13 showed no acute abnormality.  Preoperative labs noted.  Anticipate that she can proceed as planned.  Velna Ochs [facility]/Anesthesiology Phone [phone] 07/31/2013 10:49 AM

## 2013-08-04 MED ORDER — CEFAZOLIN SODIUM-DEXTROSE 2-3 GM-% IV SOLR
2.0000 g | INTRAVENOUS | Status: AC
  Administered 2013-08-05: 2 g via INTRAVENOUS
  Filled 2013-08-04: qty 50

## 2013-08-05 ENCOUNTER — Encounter (HOSPITAL_COMMUNITY)
Admission: RE | Disposition: A | Discharge status: Home / Self Care | Payer: Self-pay | Source: Ambulatory Visit | Attending: Neurological Surgery

## 2013-08-05 ENCOUNTER — Inpatient Hospital Stay (HOSPITAL_COMMUNITY)
Admission: RE | Admit: 2013-08-05 | Discharge: 2013-08-06 | DRG: 472 | Disposition: A | Discharge status: Home / Self Care | Payer: Medicare Other | Source: Ambulatory Visit | Attending: Neurological Surgery | Admitting: Neurological Surgery

## 2013-08-05 ENCOUNTER — Ambulatory Visit (HOSPITAL_COMMUNITY): Payer: Medicare Other

## 2013-08-05 ENCOUNTER — Encounter (HOSPITAL_COMMUNITY): Payer: Self-pay | Admitting: *Deleted

## 2013-08-05 ENCOUNTER — Encounter (HOSPITAL_COMMUNITY): Payer: Self-pay | Admitting: Vascular Surgery

## 2013-08-05 ENCOUNTER — Ambulatory Visit (HOSPITAL_COMMUNITY): Payer: Medicare Other | Admitting: Anesthesiology

## 2013-08-05 DIAGNOSIS — G47419 Narcolepsy without cataplexy: Secondary | ICD-10-CM | POA: Diagnosis present

## 2013-08-05 DIAGNOSIS — Z472 Encounter for removal of internal fixation device: Secondary | ICD-10-CM

## 2013-08-05 DIAGNOSIS — M4712 Other spondylosis with myelopathy, cervical region: Principal | ICD-10-CM | POA: Diagnosis present

## 2013-08-05 DIAGNOSIS — Z981 Arthrodesis status: Secondary | ICD-10-CM

## 2013-08-05 DIAGNOSIS — E559 Vitamin D deficiency, unspecified: Secondary | ICD-10-CM | POA: Diagnosis present

## 2013-08-05 DIAGNOSIS — M810 Age-related osteoporosis without current pathological fracture: Secondary | ICD-10-CM | POA: Diagnosis present

## 2013-08-05 DIAGNOSIS — Z79899 Other long term (current) drug therapy: Secondary | ICD-10-CM

## 2013-08-05 DIAGNOSIS — I10 Essential (primary) hypertension: Secondary | ICD-10-CM | POA: Diagnosis present

## 2013-08-05 DIAGNOSIS — Z9884 Bariatric surgery status: Secondary | ICD-10-CM

## 2013-08-05 DIAGNOSIS — M5000 Cervical disc disorder with myelopathy, unspecified cervical region: Secondary | ICD-10-CM | POA: Diagnosis present

## 2013-08-05 HISTORY — PX: ANTERIOR CERVICAL DECOMP/DISCECTOMY FUSION: SHX1161

## 2013-08-05 SURGERY — ANTERIOR CERVICAL DECOMPRESSION/DISCECTOMY FUSION 1 LEVEL/HARDWARE REMOVAL
Anesthesia: General | Site: Neck | Wound class: Clean

## 2013-08-05 MED ORDER — THROMBIN 5000 UNITS EX SOLR
CUTANEOUS | Status: DC | PRN
  Administered 2013-08-05 (×2): 5000 [IU] via TOPICAL

## 2013-08-05 MED ORDER — HYDROMORPHONE HCL PF 1 MG/ML IJ SOLN
0.2500 mg | INTRAMUSCULAR | Status: DC | PRN
  Administered 2013-08-05 (×2): 0.5 mg via INTRAVENOUS

## 2013-08-05 MED ORDER — DEXAMETHASONE SODIUM PHOSPHATE 4 MG/ML IJ SOLN
INTRAMUSCULAR | Status: DC | PRN
  Administered 2013-08-05: 8 mg via INTRAVENOUS

## 2013-08-05 MED ORDER — SODIUM CHLORIDE 0.9 % IJ SOLN: 3.0000 mL | Freq: Two times a day (BID) | INTRAMUSCULAR | Status: DC

## 2013-08-05 MED ORDER — NEOSTIGMINE METHYLSULFATE 1 MG/ML IJ SOLN
INTRAMUSCULAR | Status: DC | PRN
  Administered 2013-08-05: 5 mg via INTRAVENOUS

## 2013-08-05 MED ORDER — LACTATED RINGERS IV SOLN
INTRAVENOUS | Status: DC | PRN
  Administered 2013-08-05 (×2): via INTRAVENOUS

## 2013-08-05 MED ORDER — METHOCARBAMOL 100 MG/ML IJ SOLN
500.0000 mg | Freq: Four times a day (QID) | INTRAVENOUS | Status: DC | PRN
  Filled 2013-08-05: qty 5

## 2013-08-05 MED ORDER — OXYCODONE-ACETAMINOPHEN 5-325 MG PO TABS
1.0000 | ORAL_TABLET | ORAL | Status: DC | PRN
  Administered 2013-08-05 – 2013-08-06 (×4): 2 via ORAL
  Filled 2013-08-05 (×3): qty 2

## 2013-08-05 MED ORDER — BUPIVACAINE HCL (PF) 0.5 % IJ SOLN
INTRAMUSCULAR | Status: DC | PRN
  Administered 2013-08-05: 3 mL

## 2013-08-05 MED ORDER — SODIUM CHLORIDE 0.9 % IJ SOLN
3.0000 mL | INTRAMUSCULAR | Status: DC | PRN
  Administered 2013-08-06: 3 mL via INTRAVENOUS

## 2013-08-05 MED ORDER — MENTHOL 3 MG MT LOZG
1.0000 | LOZENGE | OROMUCOSAL | Status: DC | PRN
  Filled 2013-08-05: qty 9

## 2013-08-05 MED ORDER — HEMOSTATIC AGENTS (NO CHARGE) OPTIME
TOPICAL | Status: DC | PRN
  Administered 2013-08-05: 1 via TOPICAL

## 2013-08-05 MED ORDER — GLYCOPYRROLATE 0.2 MG/ML IJ SOLN
INTRAMUSCULAR | Status: DC | PRN
  Administered 2013-08-05: .7 mg via INTRAVENOUS

## 2013-08-05 MED ORDER — DOCUSATE SODIUM 100 MG PO CAPS
100.0000 mg | ORAL_CAPSULE | Freq: Two times a day (BID) | ORAL | Status: DC
  Filled 2013-08-05: qty 1

## 2013-08-05 MED ORDER — FENTANYL CITRATE 0.05 MG/ML IJ SOLN
INTRAMUSCULAR | Status: DC | PRN
  Administered 2013-08-05: 150 ug via INTRAVENOUS
  Administered 2013-08-05: 100 ug via INTRAVENOUS

## 2013-08-05 MED ORDER — OXYCODONE-ACETAMINOPHEN 5-325 MG PO TABS
1.0000 | ORAL_TABLET | Freq: Once | ORAL | Status: AC
  Administered 2013-08-05: 1 via ORAL

## 2013-08-05 MED ORDER — MORPHINE SULFATE 2 MG/ML IJ SOLN
1.0000 mg | INTRAMUSCULAR | Status: DC | PRN
  Administered 2013-08-05: 2 mg via INTRAVENOUS
  Filled 2013-08-05: qty 1

## 2013-08-05 MED ORDER — LIDOCAINE HCL 4 % MT SOLN
OROMUCOSAL | Status: DC | PRN
  Administered 2013-08-05: 4 mL via TOPICAL

## 2013-08-05 MED ORDER — ALUM & MAG HYDROXIDE-SIMETH 200-200-20 MG/5ML PO SUSP
30.0000 mL | Freq: Four times a day (QID) | ORAL | Status: DC | PRN
  Filled 2013-08-05 (×2): qty 30

## 2013-08-05 MED ORDER — ROCURONIUM BROMIDE 100 MG/10ML IV SOLN
INTRAVENOUS | Status: DC | PRN
  Administered 2013-08-05: 50 mg via INTRAVENOUS

## 2013-08-05 MED ORDER — OXYCODONE HCL 5 MG/5ML PO SOLN: 5.0000 mg | Freq: Once | ORAL | Status: DC | PRN

## 2013-08-05 MED ORDER — PROPOFOL 10 MG/ML IV BOLUS
INTRAVENOUS | Status: DC | PRN
  Administered 2013-08-05: 180 mg via INTRAVENOUS
  Administered 2013-08-05: 20 mg via INTRAVENOUS

## 2013-08-05 MED ORDER — HYDROMORPHONE HCL PF 1 MG/ML IJ SOLN
INTRAMUSCULAR | Status: AC
  Filled 2013-08-05: qty 1

## 2013-08-05 MED ORDER — SODIUM CHLORIDE 0.9 % IR SOLN
Status: DC | PRN
  Administered 2013-08-05: 13:00:00

## 2013-08-05 MED ORDER — 0.9 % SODIUM CHLORIDE (POUR BTL) OPTIME
TOPICAL | Status: DC | PRN
  Administered 2013-08-05: 1000 mL

## 2013-08-05 MED ORDER — OXYCODONE-ACETAMINOPHEN 5-325 MG PO TABS
ORAL_TABLET | ORAL | Status: AC
  Administered 2013-08-05: 1 via ORAL
  Filled 2013-08-05: qty 1

## 2013-08-05 MED ORDER — SODIUM CHLORIDE 0.9 % IV SOLN: 250.0000 mL | INTRAVENOUS | Status: DC

## 2013-08-05 MED ORDER — HYDROCHLOROTHIAZIDE 25 MG PO TABS
25.0000 mg | ORAL_TABLET | Freq: Every day | ORAL | Status: DC
  Administered 2013-08-06: 25 mg via ORAL
  Filled 2013-08-05: qty 1

## 2013-08-05 MED ORDER — PHENOL 1.4 % MT LIQD
1.0000 | OROMUCOSAL | Status: DC | PRN
  Administered 2013-08-05: 1 via OROMUCOSAL
  Filled 2013-08-05: qty 177

## 2013-08-05 MED ORDER — OXYCODONE-ACETAMINOPHEN 5-325 MG PO TABS
ORAL_TABLET | ORAL | Status: AC
  Filled 2013-08-05: qty 2

## 2013-08-05 MED ORDER — ONDANSETRON HCL 4 MG/2ML IJ SOLN
INTRAMUSCULAR | Status: DC | PRN
  Administered 2013-08-05 (×2): 4 mg via INTRAVENOUS

## 2013-08-05 MED ORDER — POLYETHYLENE GLYCOL 3350 17 GM/SCOOP PO POWD
17.0000 g | ORAL | Status: DC | PRN
Start: 2013-08-05 — End: 2013-08-06
  Administered 2013-08-06: 17 g via ORAL
  Filled 2013-08-05: qty 255

## 2013-08-05 MED ORDER — LISINOPRIL 5 MG PO TABS
5.0000 mg | ORAL_TABLET | Freq: Every day | ORAL | Status: DC
  Administered 2013-08-06: 5 mg via ORAL
  Filled 2013-08-05: qty 1

## 2013-08-05 MED ORDER — SENNA 8.6 MG PO TABS
1.0000 | ORAL_TABLET | Freq: Two times a day (BID) | ORAL | Status: DC
  Administered 2013-08-06: 8.6 mg via ORAL
  Filled 2013-08-05 (×3): qty 1

## 2013-08-05 MED ORDER — LIDOCAINE HCL (CARDIAC) 20 MG/ML IV SOLN
INTRAVENOUS | Status: DC | PRN
  Administered 2013-08-05: 100 mg via INTRAVENOUS

## 2013-08-05 MED ORDER — ACETAMINOPHEN 650 MG RE SUPP: 650.0000 mg | RECTAL | Status: DC | PRN

## 2013-08-05 MED ORDER — ONDANSETRON HCL 4 MG/2ML IJ SOLN
4.0000 mg | INTRAMUSCULAR | Status: DC | PRN
  Administered 2013-08-05: 4 mg via INTRAVENOUS
  Filled 2013-08-05: qty 2

## 2013-08-05 MED ORDER — LIDOCAINE-EPINEPHRINE 1 %-1:100000 IJ SOLN
INTRAMUSCULAR | Status: DC | PRN
  Administered 2013-08-05: 3 mL

## 2013-08-05 MED ORDER — THROMBIN 5000 UNITS EX SOLR
OROMUCOSAL | Status: DC | PRN
  Administered 2013-08-05: 14:00:00 via TOPICAL

## 2013-08-05 MED ORDER — POLYETHYLENE GLYCOL 3350 17 G PO PACK
17.0000 g | PACK | Freq: Every day | ORAL | Status: DC | PRN
  Filled 2013-08-05: qty 1

## 2013-08-05 MED ORDER — LACTATED RINGERS IV SOLN
INTRAVENOUS | Status: DC
  Administered 2013-08-05: 09:00:00 via INTRAVENOUS

## 2013-08-05 MED ORDER — PROMETHAZINE HCL 25 MG/ML IJ SOLN: 6.2500 mg | INTRAMUSCULAR | Status: DC | PRN

## 2013-08-05 MED ORDER — METHOCARBAMOL 500 MG PO TABS
500.0000 mg | ORAL_TABLET | Freq: Four times a day (QID) | ORAL | Status: DC | PRN
  Administered 2013-08-05 – 2013-08-06 (×3): 500 mg via ORAL
  Filled 2013-08-05 (×3): qty 1

## 2013-08-05 MED ORDER — ALPRAZOLAM 0.5 MG PO TABS
0.5000 mg | ORAL_TABLET | Freq: Once | ORAL | Status: DC
  Filled 2013-08-05: qty 1

## 2013-08-05 MED ORDER — BISACODYL 10 MG RE SUPP: 10.0000 mg | Freq: Every day | RECTAL | Status: DC | PRN

## 2013-08-05 MED ORDER — FENTANYL CITRATE 0.05 MG/ML IJ SOLN: 50.0000 ug | Freq: Once | INTRAMUSCULAR | Status: DC

## 2013-08-05 MED ORDER — ACETAMINOPHEN 325 MG PO TABS: 650.0000 mg | ORAL_TABLET | ORAL | Status: DC | PRN

## 2013-08-05 MED ORDER — MIDAZOLAM HCL 2 MG/2ML IJ SOLN: 1.0000 mg | INTRAMUSCULAR | Status: DC | PRN

## 2013-08-05 MED ORDER — OXYCODONE HCL 5 MG PO TABS: 5.0000 mg | ORAL_TABLET | Freq: Once | ORAL | Status: DC | PRN

## 2013-08-05 MED ORDER — MIDAZOLAM HCL 5 MG/5ML IJ SOLN
INTRAMUSCULAR | Status: DC | PRN
  Administered 2013-08-05: 2 mg via INTRAVENOUS

## 2013-08-05 MED ORDER — ALPRAZOLAM 0.5 MG PO TABS: 0.5000 mg | ORAL_TABLET | Freq: Three times a day (TID) | ORAL | Status: DC | PRN

## 2013-08-05 MED ORDER — ARTIFICIAL TEARS OP OINT
TOPICAL_OINTMENT | OPHTHALMIC | Status: DC | PRN
  Administered 2013-08-05: 1 via OPHTHALMIC

## 2013-08-05 MED ORDER — FAMOTIDINE 20 MG PO TABS
20.0000 mg | ORAL_TABLET | Freq: Two times a day (BID) | ORAL | Status: DC
  Administered 2013-08-05 – 2013-08-06 (×2): 20 mg via ORAL
  Filled 2013-08-05 (×3): qty 1

## 2013-08-05 SURGICAL SUPPLY — 59 items
BAG DECANTER FOR FLEXI CONT (MISCELLANEOUS) ×2 IMPLANT
BANDAGE GAUZE ELAST BULKY 4 IN (GAUZE/BANDAGES/DRESSINGS) ×4 IMPLANT
BIT DRILL 2.3 12 FIXED (INSTRUMENTS) ×1 IMPLANT
BIT DRILL NEURO 2X3.1 SFT TUCH (MISCELLANEOUS) ×1 IMPLANT
BUR BARREL STRAIGHT FLUTE 4.0 (BURR) ×2 IMPLANT
CANISTER SUCTION 2500CC (MISCELLANEOUS) ×2 IMPLANT
CLOTH BEACON ORANGE TIMEOUT ST (SAFETY) ×2 IMPLANT
CONT SPEC 4OZ CLIKSEAL STRL BL (MISCELLANEOUS) ×4 IMPLANT
DECANTER SPIKE VIAL GLASS SM (MISCELLANEOUS) ×2 IMPLANT
DERMABOND ADVANCED (GAUZE/BANDAGES/DRESSINGS) ×1
DERMABOND ADVANCED .7 DNX12 (GAUZE/BANDAGES/DRESSINGS) ×1 IMPLANT
DRAPE LAPAROTOMY 100X72 PEDS (DRAPES) ×2 IMPLANT
DRAPE MICROSCOPE LEICA (MISCELLANEOUS) IMPLANT
DRAPE POUCH INSTRU U-SHP 10X18 (DRAPES) ×2 IMPLANT
DRESSING TELFA 8X3 (GAUZE/BANDAGES/DRESSINGS) IMPLANT
DRILL 12MM (INSTRUMENTS) ×2
DRILL NEURO 2X3.1 SOFT TOUCH (MISCELLANEOUS) ×2
DRSG OPSITE 4X5.5 SM (GAUZE/BANDAGES/DRESSINGS) IMPLANT
DURAPREP 6ML APPLICATOR 50/CS (WOUND CARE) ×2 IMPLANT
ELECT REM PT RETURN 9FT ADLT (ELECTROSURGICAL) ×2
ELECTRODE REM PT RTRN 9FT ADLT (ELECTROSURGICAL) ×1 IMPLANT
GAUZE SPONGE 4X4 16PLY XRAY LF (GAUZE/BANDAGES/DRESSINGS) IMPLANT
GLOVE BIO SURGEON STRL SZ7.5 (GLOVE) IMPLANT
GLOVE BIO SURGEON STRL SZ8.5 (GLOVE) ×2 IMPLANT
GLOVE BIOGEL PI IND STRL 7.5 (GLOVE) IMPLANT
GLOVE BIOGEL PI IND STRL 8 (GLOVE) ×2 IMPLANT
GLOVE BIOGEL PI IND STRL 8.5 (GLOVE) ×1 IMPLANT
GLOVE BIOGEL PI INDICATOR 7.5 (GLOVE)
GLOVE BIOGEL PI INDICATOR 8 (GLOVE) ×2
GLOVE BIOGEL PI INDICATOR 8.5 (GLOVE) ×1
GLOVE ECLIPSE 7.5 STRL STRAW (GLOVE) ×4 IMPLANT
GLOVE ECLIPSE 8.5 STRL (GLOVE) ×2 IMPLANT
GLOVE EXAM NITRILE LRG STRL (GLOVE) IMPLANT
GLOVE EXAM NITRILE MD LF STRL (GLOVE) IMPLANT
GLOVE EXAM NITRILE XL STR (GLOVE) IMPLANT
GLOVE EXAM NITRILE XS STR PU (GLOVE) IMPLANT
GLOVE OPTIFIT SS 8.0 STRL (GLOVE) ×2 IMPLANT
GOWN BRE IMP SLV AUR LG STRL (GOWN DISPOSABLE) IMPLANT
GOWN BRE IMP SLV AUR XL STRL (GOWN DISPOSABLE) ×2 IMPLANT
GOWN STRL REIN 2XL LVL4 (GOWN DISPOSABLE) ×4 IMPLANT
HEAD HALTER (SOFTGOODS) ×2 IMPLANT
KIT BASIN OR (CUSTOM PROCEDURE TRAY) ×2 IMPLANT
KIT ROOM TURNOVER OR (KITS) ×2 IMPLANT
NEEDLE HYPO 22GX1.5 SAFETY (NEEDLE) ×2 IMPLANT
NEEDLE SPNL 22GX3.5 QUINCKE BK (NEEDLE) ×2 IMPLANT
NS IRRIG 1000ML POUR BTL (IV SOLUTION) ×2 IMPLANT
PACK LAMINECTOMY NEURO (CUSTOM PROCEDURE TRAY) ×2 IMPLANT
PAD ARMBOARD 7.5X6 YLW CONV (MISCELLANEOUS) ×6 IMPLANT
PLATE TRESTLE LUXE 12 1LVL (Plate) ×2 IMPLANT
RUBBERBAND STERILE (MISCELLANEOUS) ×4 IMPLANT
SCREW 12MM (Screw) ×8 IMPLANT
SPACER ASSEM CERV LORD 6M (Spacer) ×2 IMPLANT
SPONGE INTESTINAL PEANUT (DISPOSABLE) ×2 IMPLANT
SPONGE SURGIFOAM ABS GEL SZ50 (HEMOSTASIS) ×2 IMPLANT
SUT VIC AB 3-0 SH 8-18 (SUTURE) ×4 IMPLANT
SYR 20ML ECCENTRIC (SYRINGE) ×2 IMPLANT
TOWEL OR 17X24 6PK STRL BLUE (TOWEL DISPOSABLE) ×2 IMPLANT
TOWEL OR 17X26 10 PK STRL BLUE (TOWEL DISPOSABLE) ×2 IMPLANT
WATER STERILE IRR 1000ML POUR (IV SOLUTION) ×2 IMPLANT

## 2013-08-05 NOTE — H&P (Signed)
CHIEF COMPLAINT:                                          Neck, shoulder and arm pain with right upper extremity involvement down to the fingers and the hand.  HISTORY OF PRESENT ILLNESS:                     Amanda Osborne is a 62 year old individual who has been retired in the past., seen and treated her for cervical disc problems with degenerative change at C6-7.  She underwent anterior decompression and arthrodesis.  She notes more recently she developed neuralgia and right arm numbness.  This started worsening in the recent year or more.  A year ago, she underwent an cervical MRI, which demonstrates that she had degeneration of the disc at C5-6 one level above her previous arthrodesis.  She had effacement and compression of the right-sided foraminal region with essentially a disc herniation at that level.  There is spondylytic changes noted in the disc space itself.  She notes that the symptoms have been persisting and are getting worse involving her right arm and right shoulder.  She has also noted some symptoms up into her face.  REVIEW OF SYSTEMS:                                    Her systems review is notable for wearing of glasses, indigestion, and pain with eating, nausea, ringing in the ears, nasal congestion, drainage, sinus problems, high blood pressure, broken Colles fracture in the past, arm weakness, back pain, arm pain, joint pain, swelling, arthritis, neck pain, anemia, anxiety and depression on a 14-point review sheet.  PAST MEDICAL HISTORY:    Current Medical Conditions:  Reveals that she has some hypertension and she has had a history of hiatal hernia, narcolepsy, occipital neuralgia, anemia, vitamin D deficiency and osteoporosis.    Prior Operations:  Her previous surgeries include an osteotomy in 2005 for Colles fracture.  Surgery in 2000 for a C6-7 fusion and gastric bypass in 1981.    Medications and Allergies:  Her current medication list includes hydrochlorothiazide, lisinopril,  alprazolam, oxycodone, Fosamax, vitamin D, vitamin B12, famotidine, ibuprofen, MiraLax and BC powders.  All noted on current med list.  SOCIAL HISTORY:                                            She does not smoke nor does she use any alcohol.  PHYSICAL EXAMINATION:                                Height and weight have been stable at 5 feet 6.5 inches and 205 pounds.  On physical examination, I note that she can flex her head quite normally, turning is good to a +45 degrees to the left and to the right.  Her motor strength reveal good strength in the deltoid, biceps, and triceps, however, the grip strength is diminished on the right side compared to the left.  Her station and gait are intact and normal.  IMPRESSION:  The patient has evidence of significant spondylytic disease at C5-6.  Today in the office, I obtained AP and lateral radiographs of the cervical spine and I note that compared to the MRI little has changed.  There is advanced disc space collapse at C5-6.  The alignment however is good in both the coronal and saggital projections.  Findings on the x-rays suggest adjacent level disease of C5-6 but a solid arthrodesis at C6-7.  PLAN:                                                               Plan at this time would be to do a one-level anterior decompression at C5-6.  I discussed this with Amanda Osborne today.  I indicated that the approach would be similar through the front of the neck.  We would likely remove the old plate, do the removal of the disc, placement of a bone graft and a titanium plate across the C5-6 level.  The surgery can certainly be done as an outpatient and should give her some reasonably good relief and hopefully fairly prompt relief.  The risks of surgery are the same as they have been before, that is as always of course the concern of the potential for injury to the esophagus or the trachea, we do well to protect those nerves on  the way in.  As always the risks of spinal cord injury and nerve root injury, these things not withstanding I believe that the knees will experience good relief once this area is decompressed and stabilized.

## 2013-08-05 NOTE — Transfer of Care (Signed)
Immediate Anesthesia Transfer of Care Note  Patient: Amanda Osborne  Procedure(s) Performed: Procedure(s): Cervical five-six Anterior cervical decompression/diskectomy/fusion/ Synthes plate removal (N/A)  Patient Location: PACU  Anesthesia Type:General  Level of Consciousness: awake, oriented and patient cooperative  Airway & Oxygen Therapy: Patient Spontanous Breathing  Post-op Assessment: Report given to PACU RN, Post -op Vital signs reviewed and stable and Patient moving all extremities X 4  Post vital signs: Reviewed and stable  Complications: No apparent anesthesia complications

## 2013-08-05 NOTE — Plan of Care (Signed)
Problem: Consults Goal: Diagnosis - Spinal Surgery Outcome: Completed/Met Date Met:  08/05/13 Cervical Spine Fusion     

## 2013-08-05 NOTE — Anesthesia Postprocedure Evaluation (Signed)
  Anesthesia Post-op Note  Patient: Amanda Osborne  Procedure(s) Performed: Procedure(s): Cervical five-six Anterior cervical decompression/diskectomy/fusion/ Synthes plate removal (N/A)  Patient Location: PACU  Anesthesia Type:General  Level of Consciousness: awake  Airway and Oxygen Therapy: Patient Spontanous Breathing  Post-op Pain: mild  Post-op Assessment: Post-op Vital signs reviewed, Patient's Cardiovascular Status Stable, Respiratory Function Stable, Patent Airway, No signs of Nausea or vomiting and Pain level controlled  Post-op Vital Signs: stable  Complications: No apparent anesthesia complications

## 2013-08-05 NOTE — Op Note (Signed)
Date of surgery: 08/05/2013 Preoperative diagnosis: Cervical spondylosis with radiculopathy C5-C6, status post anterior decompression arthrodesis C6-C7 Post operative diagnosis: Cervical spondylosis with radiculopathy C5-C6, status post anterior decompression arthrodesis C6-C7 Procedure: Anterior cervical discectomy decompression of nerve roots and spinal canal C5-C6 arthrodesis with structural allograft, Alphatec plate fixation V4-U9, removal of old hardware C6-C7 Surgeon: Barnett Abu M.D. Asst.: Delma Officer Indications: The first and it is a 62 year old individual is had significant neck shoulder and arm pain she's had an MRI of the cervical spine it demonstrates that she has advanced spondylitic changes at C5-C6 and she is at previous decompression at C6-C7 having failed efforts at conservative management she was advised regarding the need for surgical decompression and arthrodesis at C5-C6. Procedure: The patient was brought to the operating room placed on the table in supine position. After the smooth induction of general endotracheal anesthesia neck was placed in 5 pounds of halter traction and prepped with alcohol and DuraPrep. After sterile draping and appropriate timeout procedure a transverse incision was created in the left side of the neck and carried down to the platysma. The plane between the sternocleidomastoid and strap muscles dissected bluntly until the prevertebral space was reached. The old plate was identified as a Synthes plate and the screws and plate were removed. This identified C5-C6 immediately above. The dissection was then undertaken in the longus coli muscle to allow placement of a self-retaining Caspar type retractor.  The anterior longitudinal ligament was opened at C5-C6 and ventral osteophytes were removed with a Leksell rongeur and Kerrison punch. Interspace was cleared of significant quantity of the degenerated disc material in the region of the posterior longitudinal  ligament was removed. Dissection was carried out using a high-speed drill and 3-0 Karlin curettes. Uncinate processes were drilled down and removed and osteophytes from the inferior margin of the body of C5 were removed with a Kerrison 2 mm gold punch. After the central canal and lateral recesses were well decompressed hemostasis was achieved with the bipolar cautery and some small pledgets of Gelfoam soaked in thrombin that were later irrigated away.  A 6 mm manufactured cortical cancellus graft was placed into the interspace.  Next the retractor was removed and a 12 mm trestle plate was placed over the vertebral bodies and secured with millimeter variable angle screws. A final localizing radiograph identified the position of the surgical construct. The stasis was achieved in the soft tissues and then the platysma was closed with 3-0 Vicryl in an interrupted fashion and 3-0 Vicryl was used in the subcuticular tissue. Blood loss was estimated at less than 50 cc

## 2013-08-05 NOTE — Anesthesia Preprocedure Evaluation (Addendum)
Anesthesia Evaluation  Patient identified by MRN, date of birth, ID band Patient awake    Reviewed: Allergy & Precautions, H&P , NPO status , Patient's Chart, lab work & pertinent test results  History of Anesthesia Complications (+) PONV  Airway Mallampati: II TM Distance: <3 FB Neck ROM: Limited    Dental  (+) Teeth Intact and Dental Advisory Given,    Pulmonary neg pulmonary ROS,  breath sounds clear to auscultation        Cardiovascular hypertension, Pt. on medications Rhythm:Regular Rate:Normal     Neuro/Psych  Headaches, PSYCHIATRIC DISORDERS Anxiety Prior cervical fusion Occipital neuralgia   Neuromuscular disease    GI/Hepatic GERD-  Medicated and Controlled,H/o gastric bypass   Endo/Other    Renal/GU      Musculoskeletal  (+) Arthritis -, Osteoarthritis,  Fibromyalgia -, narcotic dependent  Abdominal (+) + obese,   Peds  Hematology  (+) Blood dyscrasia, anemia ,   Anesthesia Other Findings   Reproductive/Obstetrics                         Anesthesia Physical Anesthesia Plan  ASA: III  Anesthesia Plan: General   Post-op Pain Management:    Induction: Intravenous  Airway Management Planned: Oral ETT  Additional Equipment:   Intra-op Plan:   Post-operative Plan: Extubation in OR  Informed Consent: I have reviewed the patients History and Physical, chart, labs and discussed the procedure including the risks, benefits and alternatives for the proposed anesthesia with the patient or authorized representative who has indicated his/her understanding and acceptance.     Plan Discussed with: CRNA and Surgeon  Anesthesia Plan Comments:         Anesthesia Quick Evaluation

## 2013-08-05 NOTE — Preoperative (Signed)
Beta Blockers   Reason not to administer Beta Blockers:Not Applicable 

## 2013-08-05 NOTE — Progress Notes (Signed)
Spoke with pharmacy about 0.5mg  xanax tablet not being received yet.  They said they would tube it to 25 secured.  Went to update patient and discovered she had left for OR.  Called pharmacy and explained that Neuro OR would call them if they still wanted patient to have xanax, and not to send it to 25.  Called Neuro OR, Heather,RN, informed her of all of this.  They will call pharmacy if they want patient to have xanax.

## 2013-08-05 NOTE — Anesthesia Procedure Notes (Signed)
Procedure Name: Intubation Date/Time: 08/05/2013 12:54 PM Performed by: Leona Singleton A Pre-anesthesia Checklist: Patient identified, Emergency Drugs available, Suction available and Patient being monitored Patient Re-evaluated:Patient Re-evaluated prior to inductionOxygen Delivery Method: Circle system utilized Preoxygenation: Pre-oxygenation with 100% oxygen Intubation Type: IV induction Ventilation: Mask ventilation without difficulty Laryngoscope Size: Miller and 2 Grade View: Grade II Tube type: Oral Tube size: 7.5 mm Number of attempts: 1 Airway Equipment and Method: Stylet and LTA kit utilized Placement Confirmation: ETT inserted through vocal cords under direct vision,  positive ETCO2 and breath sounds checked- equal and bilateral Secured at: 23 cm Tube secured with: Tape Dental Injury: Teeth and Oropharynx as per pre-operative assessment

## 2013-08-06 ENCOUNTER — Encounter (HOSPITAL_COMMUNITY): Payer: Self-pay | Admitting: Neurological Surgery

## 2013-08-06 DIAGNOSIS — M4712 Other spondylosis with myelopathy, cervical region: Secondary | ICD-10-CM | POA: Diagnosis present

## 2013-08-06 MED ORDER — OXYCODONE HCL 5 MG PO TABS: 10.0000 mg | ORAL_TABLET | ORAL | Status: DC | PRN

## 2013-08-06 MED ORDER — TIZANIDINE HCL 4 MG PO TABS: 4.0000 mg | ORAL_TABLET | Freq: Four times a day (QID) | ORAL | Status: DC | PRN

## 2013-08-06 NOTE — Progress Notes (Signed)
UR COMPLETED  

## 2013-08-06 NOTE — Discharge Summary (Signed)
Physician Discharge Summary  Patient ID: Amanda Osborne MRN: [id number] DOB/AGE: 1951/06/15 62 y.o.  Admit date: 08/05/2013 Discharge date: 08/06/2013  Admission Diagnoses: Cervical spondylosis C5-C6 with radiculopathy  Discharge Diagnoses: Cervical spondylosis C5-C6 with radiculopathy Principal Problem:   Spondylosis, cervical, with myelopathy C5-6   Discharged Condition: good  Hospital Course: Patient was admitted to undergo surgical decompression and arthrodesis at C5-C6. A previously placed plate at Z3-Y8 was removed. She tolerated the procedure well.  Consults: None  Significant Diagnostic Studies: None  Treatments: surgery: Anterior cervical decompression C5-C6 arthrodesis with structural allograft. Anterior plate fixation M5-H8. Removal of previously placed plate at I6-N6  Discharge Exam: Blood pressure 101/68, pulse 70, temperature 98.1 F (36.7 C), temperature source Oral, resp. rate 18, SpO2 95.00%. Incision is clean and dry motor function is intact in upper extremities station and gait are intact.  Disposition:  discharge home  Discharge Orders   Future Appointments Provider Department Dept Phone   08/23/2013 10:10 AM [facility] [phone]   09/25/2013 1:10 PM Babs Sciara, MD [place] [facility] [phone]   10/16/2013 10:00 AM [facility] [phone]   12/13/2013 10:00 AM [facility] [phone]   01/17/2014 10:00 AM [facility] Covering Provider [place] [facility] [phone]   02/07/2014 10:00 AM [facility] [phone]   Future Orders Complete By Expires   Call MD for:  redness, tenderness, or signs of infection (pain, swelling, redness, odor or green/yellow discharge around incision site)  As directed    Call MD for:  severe uncontrolled pain  As directed    Call MD for:  temperature >100.4  As directed    Diet - low sodium heart healthy   As directed    Discharge instructions  As directed    Comments:     Okay to shower. Do not apply salves or appointments to incision. No heavy lifting with the upper extremities greater than 15 pounds. May resume driving when not requiring pain medication and patient feels comfortable with doing so.   Increase activity slowly  As directed        Medication List         alendronate 70 MG tablet  Commonly known as:  FOSAMAX  Take 70 mg by mouth every 7 (seven) days. Take with a full glass of water on an empty stomach. Take on mondays     ALPRAZolam 0.5 MG tablet  Commonly known as:  XANAX  Take 1 tablet (0.5 mg total) by mouth 3 (three) times daily as needed.     BC FAST PAIN RELIEF 845-65 MG Pack  Generic drug:  Aspirin-Caffeine  Take 1 packet by mouth daily.     famotidine 20 MG tablet  Commonly known as:  PEPCID  Take 20 mg by mouth 2 (two) times daily.     hydrochlorothiazide 25 MG tablet  Commonly known as:  HYDRODIURIL  Take 1 tablet (25 mg total) by mouth daily.     ibuprofen 200 MG tablet  Commonly known as:  ADVIL,MOTRIN  Take 400 mg by mouth 3 (three) times daily.     lisinopril 5 MG tablet  Commonly known as:  PRINIVIL,ZESTRIL  Take 1 tablet (5 mg total) by mouth daily.     multivitamin with minerals Tabs tablet  Take 1 tablet by mouth daily.     oxyCODONE 5 MG immediate release tablet  Commonly known as:  ROXICODONE  Take 2 tablets (10 mg total) by mouth every 4 (four) hours as needed for pain.     oxyCODONE-acetaminophen 5-325 MG per tablet  Commonly known as:  PERCOCET/ROXICET  Take 1 tablet by mouth every 6 (six) hours as needed for pain.     polyethylene glycol powder powder  Commonly known as:  GLYCOLAX/MIRALAX  Take 17 g by mouth as needed (for constipation).     tiZANidine 4 MG tablet  Commonly known as:  ZANAFLEX  Take 1 tablet (4 mg total) by mouth every 6 (six) hours as needed.     Vitamin D3 10000 UNITS capsule  Take 10,000 Units by mouth  every other day.         SignedStefani Dama 08/06/2013, 11:11 AM

## 2013-08-06 NOTE — Progress Notes (Signed)
Pt. Alert and oriented,follows simple instructions, denies pain. Incision area without swelling, redness or S/S of infection. Voiding adequate clear yellow urine. Moving all extremities well and vitals stable and documented. Anterior Cervical surgery notes instructions given to patient and family member for home safety and precautions. Pt. and family stated understanding of instructions given. 

## 2013-08-08 ENCOUNTER — Other Ambulatory Visit: Payer: Self-pay | Admitting: *Deleted

## 2013-08-08 DIAGNOSIS — E041 Nontoxic single thyroid nodule: Secondary | ICD-10-CM

## 2013-08-23 ENCOUNTER — Encounter (HOSPITAL_COMMUNITY): Payer: Medicare Other | Attending: Hematology and Oncology

## 2013-08-23 ENCOUNTER — Other Ambulatory Visit (HOSPITAL_COMMUNITY): Payer: Self-pay | Admitting: Oncology

## 2013-08-23 DIAGNOSIS — D508 Other iron deficiency anemias: Secondary | ICD-10-CM

## 2013-08-23 DIAGNOSIS — E559 Vitamin D deficiency, unspecified: Secondary | ICD-10-CM

## 2013-08-23 DIAGNOSIS — D509 Iron deficiency anemia, unspecified: Secondary | ICD-10-CM | POA: Insufficient documentation

## 2013-08-23 LAB — IRON AND TIBC
Saturation Ratios: 17 % — ABNORMAL LOW (ref 20–55)
TIBC: 335 ug/dL (ref 250–470)

## 2013-08-23 LAB — CBC WITH DIFFERENTIAL/PLATELET
Basophils Absolute: 0 10*3/uL (ref 0.0–0.1)
Basophils Relative: 1 % (ref 0–1)
Lymphocytes Relative: 27 % (ref 12–46)
MCHC: 32.5 g/dL (ref 30.0–36.0)
Neutro Abs: 2.9 10*3/uL (ref 1.7–7.7)
Neutrophils Relative %: 58 % (ref 43–77)
Platelets: 308 10*3/uL (ref 150–400)
RDW: 13.6 % (ref 11.5–15.5)
WBC: 4.9 10*3/uL (ref 4.0–10.5)

## 2013-08-23 LAB — FERRITIN: Ferritin: 71 ng/mL (ref 10–291)

## 2013-08-23 NOTE — Progress Notes (Signed)
Labs drawn today for cbc/diff,ferr,Iron and IBC,VD25

## 2013-09-05 ENCOUNTER — Encounter (HOSPITAL_COMMUNITY): Payer: Self-pay

## 2013-09-05 ENCOUNTER — Ambulatory Visit (HOSPITAL_COMMUNITY)
Admission: RE | Admit: 2013-09-05 | Discharge: 2013-09-05 | Disposition: A | Discharge status: Home / Self Care | Payer: Medicare Other | Source: Ambulatory Visit | Attending: Family Medicine | Admitting: Family Medicine

## 2013-09-05 VITALS — BP 108/68 | HR 68 | Temp 97.4°F | Resp 16

## 2013-09-05 DIAGNOSIS — E041 Nontoxic single thyroid nodule: Secondary | ICD-10-CM | POA: Insufficient documentation

## 2013-09-05 MED ORDER — LIDOCAINE HCL (PF) 2 % IJ SOLN
10.0000 mL | Freq: Once | INTRAMUSCULAR | Status: AC
  Administered 2013-09-05: 10 mL

## 2013-09-05 MED ORDER — LIDOCAINE HCL (PF) 2 % IJ SOLN
INTRAMUSCULAR | Status: AC
  Administered 2013-09-05: 10 mL
  Filled 2013-09-05: qty 10

## 2013-09-05 NOTE — Progress Notes (Signed)
Thyroid biopsy complete no signs of distress.  

## 2013-09-05 NOTE — Procedures (Signed)
PreOperative Dx: RIGHT thyroid nodule Postoperative Dx: RIGHT thyroid nodule Procedure:   US guided FNA of RIGHT thyroid nodule Radiologist:  Tyron Russell Anesthesia:  2 ml of 2 lidocaine Specimen:  FNA x 4 (first specimen appeared hypocellular)  EBL:   None Complications: None

## 2013-09-10 ENCOUNTER — Telehealth: Payer: Self-pay | Admitting: Family Medicine

## 2013-09-10 NOTE — Telephone Encounter (Signed)
Patient is calling to get results of biopsy she had last thursday

## 2013-09-10 NOTE — Telephone Encounter (Signed)
Biopsy inconclusive, I rec ENT. Does pt have preference?

## 2013-09-11 NOTE — Telephone Encounter (Signed)
Patient has appointment already scheduled for 09/20/13.

## 2013-09-11 NOTE — Telephone Encounter (Signed)
Notified patient biopsy inconclusive, I rec ENT. Patient said she is going to think about it and she will call back if she decides to go ahead with referral.

## 2013-09-11 NOTE — Telephone Encounter (Signed)
The patient needs to understand that this potentially could be a cancer and that we cannot diagnose based on the current information. I highly recommend ENT appointment or patient needs to come in sit down and discuss this face-to-face

## 2013-09-20 ENCOUNTER — Encounter: Payer: Self-pay | Admitting: Family Medicine

## 2013-09-20 ENCOUNTER — Ambulatory Visit (INDEPENDENT_AMBULATORY_CARE_PROVIDER_SITE_OTHER): Payer: Medicare Other | Admitting: Family Medicine

## 2013-09-20 ENCOUNTER — Encounter (HOSPITAL_BASED_OUTPATIENT_CLINIC_OR_DEPARTMENT_OTHER): Payer: Medicare Other

## 2013-09-20 VITALS — BP 118/78 | Ht 66.5 in | Wt 197.2 lb

## 2013-09-20 DIAGNOSIS — M4712 Other spondylosis with myelopathy, cervical region: Secondary | ICD-10-CM

## 2013-09-20 DIAGNOSIS — D649 Anemia, unspecified: Secondary | ICD-10-CM

## 2013-09-20 DIAGNOSIS — D509 Iron deficiency anemia, unspecified: Secondary | ICD-10-CM

## 2013-09-20 DIAGNOSIS — E559 Vitamin D deficiency, unspecified: Secondary | ICD-10-CM

## 2013-09-20 LAB — CBC WITH DIFFERENTIAL/PLATELET
Basophils Absolute: 0 10*3/uL (ref 0.0–0.1)
Eosinophils Absolute: 0.3 10*3/uL (ref 0.0–0.7)
Eosinophils Relative: 6 % — ABNORMAL HIGH (ref 0–5)
HCT: 43.4 % (ref 36.0–46.0)
Lymphocytes Relative: 21 % (ref 12–46)
Lymphs Abs: 1 10*3/uL (ref 0.7–4.0)
MCH: 29.9 pg (ref 26.0–34.0)
MCV: 90.2 fL (ref 78.0–100.0)
Monocytes Absolute: 0.4 10*3/uL (ref 0.1–1.0)
Platelets: 332 10*3/uL (ref 150–400)
RDW: 13.5 % (ref 11.5–15.5)
WBC: 4.8 10*3/uL (ref 4.0–10.5)

## 2013-09-20 LAB — FERRITIN: Ferritin: 112 ng/mL (ref 10–291)

## 2013-09-20 LAB — IRON AND TIBC
Saturation Ratios: 14 % — ABNORMAL LOW (ref 20–55)
UIBC: 273 ug/dL (ref 125–400)

## 2013-09-20 MED ORDER — OXYCODONE-ACETAMINOPHEN 5-325 MG PO TABS: 1.0000 | ORAL_TABLET | Freq: Four times a day (QID) | ORAL | Status: DC | PRN

## 2013-09-20 MED ORDER — ALPRAZOLAM 0.5 MG PO TABS: 0.5000 mg | ORAL_TABLET | Freq: Three times a day (TID) | ORAL | Status: DC | PRN

## 2013-09-20 NOTE — Progress Notes (Signed)
Labs drawn today for cbc/diff,ferr,Iron and IBC, vd25

## 2013-09-20 NOTE — Progress Notes (Signed)
  Subjective:    Patient ID: Amanda Osborne, female    DOB: 1951-05-12, 62 y.o.   MRN: [id number]  HPI Patient is here today for a f/u appt.  She was told she needs to see an ENT and wants to discuss with you about this. Patient has a thyroid nodule. Fine needle aspiration was inconclusive. May need to have this area removed. She denies any neck pain soreness hoarseness.  She needs a refill on Xanax and Oxycodone as well. She relates her pain is under good control. Denies abusing it.  This patient was seen today for chronic pain  The medication list was reviewed and updated.  Discussion was held with the patient regarding compliance with pain medication. The patient was advised the importance of maintaining medication and not using illegal substances with these. The patient was educated that we can provide 3 monthly scripts for their medication, it is their responsibility to follow the instructions. Discussion was held with the patient to make sure they're not having significant side effects. Patient is aware that pain medications are meant to minimize the severity of the pain to allow their pain levels to improve to allow for better function. They are aware of that pain medications cannot totally remove their pain.   Past medical history benign family history benign    Review of Systems Patient denies high fever sweats chills vomiting diarrhea denies cough wheezing difficulty breathing.    Objective:   Physical Exam No neck masses are felt lungs are clear no crackles heart is regular pulse normal extremities no edema skin warm dry neurologic grossly normal Patient had neck surgery but she complains of bilateral lateral aspects of the neck hurting in the posterior portion along with right trapezius pain and discomfort.    Assessment & Plan:  #1 we will talk with the ENT about whether or not this is something that needs removal or not or should he go back through a fine-needle  aspiration biopsy. #2 chronic pain and discomfort stable. Prescriptions given. Enough for 3 months 3 separate scripts. Followup 3 months #3 anxiety issues refills on Xanax given patient does not abuse it #4 iron deficient anemia followup gastroenterology as planned

## 2013-09-25 ENCOUNTER — Ambulatory Visit: Payer: Medicare Other | Admitting: Family Medicine

## 2013-09-26 ENCOUNTER — Other Ambulatory Visit: Payer: Self-pay

## 2013-10-16 ENCOUNTER — Other Ambulatory Visit (HOSPITAL_COMMUNITY): Payer: Medicare Other

## 2013-10-25 ENCOUNTER — Telehealth: Payer: Self-pay | Admitting: Family Medicine

## 2013-10-25 NOTE — Telephone Encounter (Signed)
See chart, pt would like for a new letter to be written to help with her med cost.

## 2013-10-28 NOTE — Telephone Encounter (Signed)
A letter was done please forwarded to the patient

## 2013-12-13 ENCOUNTER — Other Ambulatory Visit (HOSPITAL_COMMUNITY): Payer: Medicare Other

## 2013-12-20 ENCOUNTER — Ambulatory Visit (INDEPENDENT_AMBULATORY_CARE_PROVIDER_SITE_OTHER): Payer: Medicare Other | Admitting: Family Medicine

## 2013-12-20 ENCOUNTER — Encounter: Payer: Self-pay | Admitting: Family Medicine

## 2013-12-20 VITALS — BP 122/80 | Ht 67.0 in | Wt 192.8 lb

## 2013-12-20 DIAGNOSIS — M25571 Pain in right ankle and joints of right foot: Secondary | ICD-10-CM | POA: Insufficient documentation

## 2013-12-20 DIAGNOSIS — G8929 Other chronic pain: Secondary | ICD-10-CM

## 2013-12-20 DIAGNOSIS — M549 Dorsalgia, unspecified: Secondary | ICD-10-CM

## 2013-12-20 DIAGNOSIS — D509 Iron deficiency anemia, unspecified: Secondary | ICD-10-CM

## 2013-12-20 DIAGNOSIS — M25579 Pain in unspecified ankle and joints of unspecified foot: Secondary | ICD-10-CM

## 2013-12-20 MED ORDER — ACETAMINOPHEN-CODEINE #3 300-30 MG PO TABS: 1.0000 | ORAL_TABLET | Freq: Four times a day (QID) | ORAL | Status: DC | PRN

## 2013-12-20 NOTE — Progress Notes (Signed)
   Subjective:    Patient ID: Amanda Osborne, female    DOB: [birth year], 63 y.o.   MRN: [id number]  HPI Patient arrives for a follow up on chronic pain management. This patient was seen today for chronic pain  The medication list was reviewed and updated.  Discussion was held with the patient regarding compliance with pain medication. The patient was advised the importance of maintaining medication and not using illegal substances with these. The patient was educated that we can provide 3 monthly scripts for their medication, it is their responsibility to follow the instructions. Discussion was held with the patient to make sure they're not having significant side effects. Patient is aware that pain medications are meant to minimize the severity of the pain to allow their pain levels to improve to allow for better function. They are aware of that pain medications cannot totally remove their pain.   Patient relates that she is concerned a little bit about a thyroid nodule. Had ultrasound that was essentially negative but it is recommended a followup ultrasound in April we will set that up  She does have artificial anemia she does try to take iron tablets she follows up with hematology so far they don't feel she needs infusions.  Blood pressures been good she's been watching her diet.   Patient also wants to discuss thyroid issue further.   Review of Systems  Constitutional: Negative for activity change, appetite change and fatigue.  HENT: Negative for congestion.   Respiratory: Negative for cough.   Cardiovascular: Negative for chest pain.  Gastrointestinal: Negative for abdominal pain.  Endocrine: Negative for polydipsia and polyphagia.  Genitourinary: Negative for frequency.  Neurological: Negative for weakness.  Psychiatric/Behavioral: Negative for confusion.       Objective:   Physical Exam Right ankle subjective pain no swelling noted calves normal knees normal Lungs are  clear heart regular no murmurs Pulses normal blood pressure good Neurologic grossly normal Psychologic patient is stable doing well.       Assessment & Plan:  #1 chronic pain-her insurance company is making it. Difficult to treat her. The medicine that she is always done well with his no longer cover at a lower tear. As a result is very expensive. So therefore she asked that we look at other options including tramadol, Tylenol #3, oxycodone 10 mg, lauded. After long discussion she opts to try Tylenol #3. Prescription was written for 120 tablets one 4 times a day. She will notify us if this isn't working. If this is not working then the next step would be consideration of oxycodone 10 mg cutting the tablet in half using a half tablet every 6 hours as necessary and taking a Tylenol with it  #2 right ankle pain it wakes her up at night I doubt that this is I nerve impingement more likely arthritis but we will get an x-ray  #3 thyroid nodule-biopsy that was completed on this a while back was inconclusive/benign. It was recommended a repeat ultrasound in 6 months time we will go ahead and do that. This will be done in April.  #4 iron deficient anemia. She is taking iron tablets. We will look at lab work. Check this periodically. She follows up with hematology but if there is nothing else they will do for her she would just followup here for this  #5 hyperlipidemia-we will follow this periodically  #6 osteoporosis continue the Fosamax. Not due for bone density currently

## 2013-12-24 ENCOUNTER — Encounter: Payer: Self-pay | Admitting: *Deleted

## 2013-12-24 ENCOUNTER — Encounter (HOSPITAL_COMMUNITY): Payer: Medicare Other | Attending: Hematology and Oncology

## 2013-12-24 ENCOUNTER — Other Ambulatory Visit: Payer: Self-pay | Admitting: *Deleted

## 2013-12-24 ENCOUNTER — Ambulatory Visit (HOSPITAL_COMMUNITY)
Admission: RE | Admit: 2013-12-24 | Discharge: 2013-12-24 | Disposition: A | Discharge status: Home / Self Care | Payer: Medicare Other | Source: Ambulatory Visit | Attending: Family Medicine | Admitting: Family Medicine

## 2013-12-24 DIAGNOSIS — E559 Vitamin D deficiency, unspecified: Secondary | ICD-10-CM | POA: Insufficient documentation

## 2013-12-24 DIAGNOSIS — D509 Iron deficiency anemia, unspecified: Secondary | ICD-10-CM

## 2013-12-24 DIAGNOSIS — M25579 Pain in unspecified ankle and joints of unspecified foot: Secondary | ICD-10-CM | POA: Insufficient documentation

## 2013-12-24 LAB — IRON AND TIBC
Iron: 68 ug/dL (ref 42–135)
Saturation Ratios: 23 % (ref 20–55)
TIBC: 302 ug/dL (ref 250–470)
UIBC: 234 ug/dL (ref 125–400)

## 2013-12-24 LAB — CBC WITH DIFFERENTIAL/PLATELET
Basophils Absolute: 0 10*3/uL (ref 0.0–0.1)
Basophils Relative: 1 % (ref 0–1)
Eosinophils Absolute: 0.3 10*3/uL (ref 0.0–0.7)
Eosinophils Relative: 7 % — ABNORMAL HIGH (ref 0–5)
HCT: 41.3 % (ref 36.0–46.0)
HEMOGLOBIN: 13.5 g/dL (ref 12.0–15.0)
LYMPHS ABS: 1.1 10*3/uL (ref 0.7–4.0)
LYMPHS PCT: 27 % (ref 12–46)
MCH: 29.7 pg (ref 26.0–34.0)
MCHC: 32.7 g/dL (ref 30.0–36.0)
MCV: 90.8 fL (ref 78.0–100.0)
MONO ABS: 0.2 10*3/uL (ref 0.1–1.0)
Monocytes Relative: 5 % (ref 3–12)
Neutro Abs: 2.3 10*3/uL (ref 1.7–7.7)
Neutrophils Relative %: 60 % (ref 43–77)
Platelets: 309 10*3/uL (ref 150–400)
RBC: 4.55 MIL/uL (ref 3.87–5.11)
RDW: 13.8 % (ref 11.5–15.5)
WBC: 3.9 10*3/uL — AB (ref 4.0–10.5)

## 2013-12-24 LAB — FERRITIN: FERRITIN: 44 ng/mL (ref 10–291)

## 2013-12-24 NOTE — Progress Notes (Signed)
Labs drawn today for cbc/diff,vd25,ferr,Iron and IBC

## 2013-12-25 ENCOUNTER — Encounter: Payer: Self-pay | Admitting: Family Medicine

## 2013-12-25 ENCOUNTER — Other Ambulatory Visit (HOSPITAL_COMMUNITY): Payer: Self-pay | Admitting: Oncology

## 2013-12-25 DIAGNOSIS — D509 Iron deficiency anemia, unspecified: Secondary | ICD-10-CM

## 2013-12-25 LAB — LIPID PANEL
CHOL/HDL RATIO: 4.5 ratio
CHOLESTEROL: 207 mg/dL — AB (ref 0–200)
HDL: 46 mg/dL (ref >39)
LDL Cholesterol: 137 mg/dL — ABNORMAL HIGH (ref 0–99)
Triglycerides: 119 mg/dL (ref <150)
VLDL: 24 mg/dL (ref 0–40)

## 2013-12-25 LAB — VITAMIN D 25 HYDROXY (VIT D DEFICIENCY, FRACTURES): Vit D, 25-Hydroxy: 44 ng/mL (ref 30–89)

## 2013-12-26 ENCOUNTER — Encounter (HOSPITAL_BASED_OUTPATIENT_CLINIC_OR_DEPARTMENT_OTHER): Payer: Medicare Other

## 2013-12-26 ENCOUNTER — Telehealth: Payer: Self-pay | Admitting: Family Medicine

## 2013-12-26 VITALS — BP 104/58 | HR 68 | Temp 98.2°F | Resp 16

## 2013-12-26 DIAGNOSIS — D509 Iron deficiency anemia, unspecified: Secondary | ICD-10-CM

## 2013-12-26 MED ORDER — SODIUM CHLORIDE 0.9 % IV SOLN
INTRAVENOUS | Status: DC
  Administered 2013-12-26: 12:00:00 via INTRAVENOUS

## 2013-12-26 MED ORDER — SODIUM CHLORIDE 0.9 % IJ SOLN
10.0000 mL | INTRAMUSCULAR | Status: DC | PRN
  Administered 2013-12-26: 10 mL via INTRAVENOUS

## 2013-12-26 MED ORDER — SODIUM CHLORIDE 0.9 % IV SOLN
1020.0000 mg | Freq: Once | INTRAVENOUS | Status: AC
  Administered 2013-12-26: 1020 mg via INTRAVENOUS
  Filled 2013-12-26: qty 34

## 2013-12-26 NOTE — Telephone Encounter (Signed)
Patient was told to call back with results to taking new pain medication. She said that Tylenol 3 is not working as well as she hoped it would. She would like to try oxycodone 10 mg and cut in half.

## 2013-12-26 NOTE — Telephone Encounter (Signed)
Patient stated that she is going to finish taking the Tylenol #3 since she paid for them and call us back near the end of the month for the oxycodone scripts.

## 2013-12-26 NOTE — Telephone Encounter (Signed)
She will need to bring other med back for disposal, then we can do oxycodone 10 mg #60, 1/2 q6 hour, if she agrees to that then may do 3 scripts

## 2014-01-17 ENCOUNTER — Telehealth: Payer: Self-pay | Admitting: Family Medicine

## 2014-01-17 ENCOUNTER — Ambulatory Visit (HOSPITAL_COMMUNITY): Payer: Medicare Other

## 2014-01-17 MED ORDER — OXYCODONE HCL 10 MG PO TABS: ORAL_TABLET | ORAL | Status: DC

## 2014-01-17 NOTE — Telephone Encounter (Signed)
Scripts ready for pick up. Pt notified.  

## 2014-01-17 NOTE — Telephone Encounter (Signed)
Patient says that the tylenol 3 did not work so good with her and would like to try oxycodone 10 mg and she will cut in half.

## 2014-01-17 NOTE — Telephone Encounter (Signed)
Oxycodone 10mg  1/2 qid  for pain ( 1/2 q 6 hours ) may have 2 scripts #60, needs OV 2 months

## 2014-01-18 NOTE — Progress Notes (Signed)
This encounter was created in error - please disregard.

## 2014-01-21 ENCOUNTER — Other Ambulatory Visit: Payer: Self-pay | Admitting: Family Medicine

## 2014-01-27 ENCOUNTER — Ambulatory Visit (INDEPENDENT_AMBULATORY_CARE_PROVIDER_SITE_OTHER): Payer: Medicare Other | Admitting: Nurse Practitioner

## 2014-01-27 ENCOUNTER — Encounter: Payer: Self-pay | Admitting: Nurse Practitioner

## 2014-01-27 VITALS — BP 110/74 | Ht 66.5 in | Wt 196.0 lb

## 2014-01-27 DIAGNOSIS — M4000 Postural kyphosis, site unspecified: Secondary | ICD-10-CM

## 2014-01-27 DIAGNOSIS — M4712 Other spondylosis with myelopathy, cervical region: Secondary | ICD-10-CM

## 2014-01-27 DIAGNOSIS — M549 Dorsalgia, unspecified: Secondary | ICD-10-CM

## 2014-01-27 DIAGNOSIS — M40209 Unspecified kyphosis, site unspecified: Secondary | ICD-10-CM

## 2014-01-27 DIAGNOSIS — G8929 Other chronic pain: Secondary | ICD-10-CM

## 2014-01-27 MED ORDER — PREDNISONE 20 MG PO TABS: ORAL_TABLET | ORAL | Status: DC

## 2014-01-27 MED ORDER — CYCLOBENZAPRINE HCL 10 MG PO TABS: 10.0000 mg | ORAL_TABLET | Freq: Three times a day (TID) | ORAL | Status: DC | PRN

## 2014-01-27 MED ORDER — AMITRIPTYLINE HCL 10 MG PO TABS: 10.0000 mg | ORAL_TABLET | Freq: Every day | ORAL | Status: DC

## 2014-01-27 NOTE — Progress Notes (Signed)
Subjective:  Presents for complaints of a flareup of her neuralgia symptoms in her cervical area. Patient had neck surgery back in September. Was doing well for while, has had significant symptoms for the past 3 weeks. Very limited: Finances. Some relief with moist heat. Currently on Fosamax. Takes oxycodone 3-4 times per day which allows her to function and perform daily activities. Has been on Neurontin in the past, had some mild drowsiness but no major side effects otherwise. Mild tingling in the fingertips, no weakness or numbness of the arms. Main discomfort is in the cervical area into the upper rhomboids. Has also experienced some depression, was recently tried Celexa which caused nausea.  Objective:   BP 110/74  Ht 5' 6.5" (1.689 m)  Wt 196 lb (88.905 kg)  BMI 31.16 kg/m2 NAD. Alert, oriented. Mildly anxious affect. Lungs clear. Heart regular rate rhythm. Significant kyphosis on exam. Extreme tenderness noted at the upper part of the cervical muscles at the base of the occiput. Tenderness extends into the trapezius and more so in the rhomboids. Limited ROM of neck due to tenderness. Arm and hand strength 5+ bilateral, sensation grossly intact. Radial pulses strong and equal.  Assessment: Problem List Items Addressed This Visit     Nervous and Auditory   Spondylosis, cervical, with myelopathy C5-6   Relevant Medications      acetaminophen (TYLENOL) 500 MG tablet      cyclobenzaprine (FLEXERIL) tablet      predniSONE (DELTASONE) tablet     Musculoskeletal and Integument   Kyphosis     Other   Chronic back pain - Primary      Plan: Meds ordered this encounter  Medications  . acetaminophen (TYLENOL) 500 MG tablet    Sig: Take 500 mg by mouth every 6 (six) hours as needed.  . cyclobenzaprine (FLEXERIL) 10 MG tablet    Sig: Take 1 tablet (10 mg total) by mouth 3 (three) times daily as needed for muscle spasms.    Dispense:  30 tablet    Refill:  0    Order Specific Question:   Supervising Provider    Answer:  Mikey Kirschner [[id number]]  . amitriptyline (ELAVIL) 10 MG tablet    Sig: Take 1 tablet (10 mg total) by mouth at bedtime.    Dispense:  30 tablet    Refill:  2    Order Specific Question:  Supervising Provider    Answer:  Mikey Kirschner [[id number]]  . predniSONE (DELTASONE) 20 MG tablet    Sig: 3 po qd x 3 d then 2 po qd x 3 d then 1 po qd x 3 d    Dispense:  18 tablet    Refill:  0    Order Specific Question:  Supervising Provider    Answer:  Mikey Kirschner [[id number]]   Discussed options at length between amitriptyline, gabapentin and Lyrica. Patient wishes to try amitriptyline. Increase to 2 tablets after 10-14 days if tolerated. Call back if new prescription as needed. Patient also requesting a course of prednisone to see if this will give her some immediate relief. Drowsiness precautions with amitriptyline, Flexeril and oxycodone. Followup with Dr. Nicki Reaper on 4/26 as planned, call back sooner if any problems.

## 2014-02-07 ENCOUNTER — Other Ambulatory Visit (HOSPITAL_COMMUNITY): Payer: Medicare Other

## 2014-02-07 ENCOUNTER — Encounter (HOSPITAL_COMMUNITY): Payer: Self-pay

## 2014-02-20 ENCOUNTER — Encounter: Payer: Self-pay | Admitting: Nurse Practitioner

## 2014-02-21 ENCOUNTER — Telehealth: Payer: Self-pay | Admitting: *Deleted

## 2014-02-21 ENCOUNTER — Other Ambulatory Visit: Payer: Self-pay | Admitting: Nurse Practitioner

## 2014-02-21 ENCOUNTER — Other Ambulatory Visit: Payer: Self-pay | Admitting: *Deleted

## 2014-02-21 DIAGNOSIS — M40209 Unspecified kyphosis, site unspecified: Secondary | ICD-10-CM

## 2014-02-21 DIAGNOSIS — G8929 Other chronic pain: Secondary | ICD-10-CM

## 2014-02-21 DIAGNOSIS — M479 Spondylosis, unspecified: Secondary | ICD-10-CM

## 2014-02-21 DIAGNOSIS — M549 Dorsalgia, unspecified: Principal | ICD-10-CM

## 2014-02-21 MED ORDER — AMITRIPTYLINE HCL 25 MG PO TABS: 25.0000 mg | ORAL_TABLET | Freq: Every day | ORAL | Status: DC

## 2014-02-21 NOTE — Telephone Encounter (Signed)
Notified patient of the scheduled MRI on Wed. April 8th @ 7:45am. Patient verbalized understanding.

## 2014-02-26 ENCOUNTER — Ambulatory Visit (HOSPITAL_COMMUNITY): Payer: Medicare Other

## 2014-03-12 ENCOUNTER — Telehealth: Payer: Self-pay | Admitting: *Deleted

## 2014-03-12 ENCOUNTER — Other Ambulatory Visit: Payer: Self-pay | Admitting: *Deleted

## 2014-03-12 DIAGNOSIS — E041 Nontoxic single thyroid nodule: Secondary | ICD-10-CM

## 2014-03-12 NOTE — Telephone Encounter (Signed)
Thyroid ultrasound scheduled [name] May 12 at 3 pm to follow up on thyroid nodule. Pt notified of appt.

## 2014-03-17 ENCOUNTER — Ambulatory Visit (INDEPENDENT_AMBULATORY_CARE_PROVIDER_SITE_OTHER): Payer: Medicare Other | Admitting: Family Medicine

## 2014-03-17 ENCOUNTER — Encounter: Payer: Self-pay | Admitting: Family Medicine

## 2014-03-17 VITALS — BP 112/78 | Ht 66.5 in | Wt 191.0 lb

## 2014-03-17 DIAGNOSIS — E041 Nontoxic single thyroid nodule: Secondary | ICD-10-CM

## 2014-03-17 DIAGNOSIS — M4712 Other spondylosis with myelopathy, cervical region: Secondary | ICD-10-CM

## 2014-03-17 DIAGNOSIS — M549 Dorsalgia, unspecified: Secondary | ICD-10-CM

## 2014-03-17 DIAGNOSIS — I10 Essential (primary) hypertension: Secondary | ICD-10-CM

## 2014-03-17 DIAGNOSIS — G8929 Other chronic pain: Secondary | ICD-10-CM

## 2014-03-17 MED ORDER — OXYCODONE HCL 5 MG PO TABS: 5.0000 mg | ORAL_TABLET | Freq: Four times a day (QID) | ORAL | Status: DC | PRN

## 2014-03-17 NOTE — Progress Notes (Signed)
   Subjective:    Patient ID: Amanda Osborne, female    DOB: 12-Dec-1950, 63 y.o.   MRN: [id number]  HPINeeds pain meds refilled. Taking for neck and back pain. MRI thoracic spine was canceled because of insurance. Needed follow up visit first.   Thyroid ultrasound was also canceled because of insurance. Follow up on thyroid nodule.   Was not able to take amtriptyline due to side effects. Legs and feet swelling, not urinating as much. Pt states she was in a dream state while taking meds.   Long discussion was held with the patient discussing her thyroid issues her neck issues her low back issues pain control issues and the importance of running some tests to look at the thyroid nodule in the neck. Greater than half the time was spent in discussion 25 minutes with patient.  Clear secretions  Review of Systems  Constitutional: Negative for activity change, appetite change and fatigue.  HENT: Negative for congestion.   Respiratory: Negative for cough, choking and shortness of breath.   Cardiovascular: Negative for chest pain.  Gastrointestinal: Negative for abdominal pain.  Endocrine: Negative for polydipsia and polyphagia.  Genitourinary: Negative for frequency.  Neurological: Negative for weakness.  Psychiatric/Behavioral: Negative for confusion.       Objective:   Physical Exam  Vitals reviewed. Constitutional: She appears well-nourished. No distress.  HENT:  Head: Normocephalic.  Cardiovascular: Normal rate, regular rhythm and normal heart sounds.   No murmur heard. Pulmonary/Chest: Effort normal and breath sounds normal. No respiratory distress.  Musculoskeletal: She exhibits no edema.  On physical exam she does have tenderness in the posterior aspects of her neck subjective discomfort down both arms tingling in her hands and some atrophy of the muscles of the hand on the left side.  Lymphadenopathy:    She has no cervical adenopathy.  Neurological: She is alert. She  exhibits normal muscle tone.  Psychiatric: Her behavior is normal.          Assessment & Plan:  1. Spondylosis, cervical, with myelopathy C5-6 Patient with significant cervical neck pain this been going on for a long span of time but over the past several months severe pain in the neck radiates into the arms tingling in both hands. Has had previous surgery. Pain keeps her up at night despite using pain medication. With the bilateral arm pain some weakness in the left hand as well as neck pain and previous surgery it is recommended highly that we do this MRI. - MR Cervical Spine Wo Contrast  2. Chronic back pain The patient would prefer to go back to using oxycodone 5 mg every 6 hours when necessary severe pain #120 per month. This does a decent job help controlling it. 3. HTN (hypertension), benign Her blood pressure is under good control today she is taking her medicines we will check metabolic 7 profile. - Basic metabolic panel  4. Thyroid nodule She had a history of a thyroid nodule fine needle aspiration was inconclusive so therefore is recommended to repeat the ultrasound 6 months later she was unable to do so back in February but she is now also therefore we will go ahead and do the ultrasound to look for any change in the nodule. The nodule getting bigger referral to ENT.  Patient followup 3 months regarding pain - TSH - US Soft Tissue Head/Neck

## 2014-04-01 ENCOUNTER — Encounter (HOSPITAL_COMMUNITY): Payer: Self-pay

## 2014-04-01 ENCOUNTER — Ambulatory Visit (HOSPITAL_COMMUNITY)
Admission: RE | Admit: 2014-04-01 | Discharge: 2014-04-01 | Disposition: A | Discharge status: Home / Self Care | Payer: Medicare Other | Source: Ambulatory Visit | Attending: Family Medicine | Admitting: Family Medicine

## 2014-04-01 ENCOUNTER — Other Ambulatory Visit (HOSPITAL_COMMUNITY): Payer: Medicare Other

## 2014-04-01 DIAGNOSIS — E041 Nontoxic single thyroid nodule: Secondary | ICD-10-CM | POA: Insufficient documentation

## 2014-04-01 DIAGNOSIS — M5030 Other cervical disc degeneration, unspecified cervical region: Secondary | ICD-10-CM | POA: Insufficient documentation

## 2014-04-01 DIAGNOSIS — M4802 Spinal stenosis, cervical region: Secondary | ICD-10-CM | POA: Insufficient documentation

## 2014-04-01 DIAGNOSIS — R599 Enlarged lymph nodes, unspecified: Secondary | ICD-10-CM | POA: Insufficient documentation

## 2014-04-03 ENCOUNTER — Telehealth: Payer: Self-pay | Admitting: Family Medicine

## 2014-04-03 DIAGNOSIS — D649 Anemia, unspecified: Secondary | ICD-10-CM

## 2014-04-03 DIAGNOSIS — D509 Iron deficiency anemia, unspecified: Secondary | ICD-10-CM

## 2014-04-03 MED ORDER — ALPRAZOLAM 0.5 MG PO TABS: 0.5000 mg | ORAL_TABLET | Freq: Three times a day (TID) | ORAL | Status: DC | PRN

## 2014-04-03 NOTE — Telephone Encounter (Signed)
Patient needs Rx for her xanax to [facility].

## 2014-04-03 NOTE — Telephone Encounter (Signed)
May have refill with 4 distal refills

## 2014-04-03 NOTE — Telephone Encounter (Signed)
Rx faxed to pharmacy  

## 2014-04-04 NOTE — Progress Notes (Signed)
Patient notified and verbalized understanding of the test results.  

## 2014-05-01 ENCOUNTER — Encounter: Payer: Self-pay | Admitting: Family Medicine

## 2014-06-13 ENCOUNTER — Ambulatory Visit (INDEPENDENT_AMBULATORY_CARE_PROVIDER_SITE_OTHER): Payer: Medicare Other | Admitting: Family Medicine

## 2014-06-13 ENCOUNTER — Encounter: Payer: Self-pay | Admitting: Family Medicine

## 2014-06-13 VITALS — BP 122/84 | Ht 65.5 in | Wt 179.0 lb

## 2014-06-13 DIAGNOSIS — M549 Dorsalgia, unspecified: Secondary | ICD-10-CM

## 2014-06-13 DIAGNOSIS — G8929 Other chronic pain: Secondary | ICD-10-CM

## 2014-06-13 MED ORDER — OXYCODONE HCL 5 MG PO TABS: 5.0000 mg | ORAL_TABLET | Freq: Four times a day (QID) | ORAL | Status: DC | PRN

## 2014-06-13 MED ORDER — HYDROCHLOROTHIAZIDE 25 MG PO TABS: ORAL_TABLET | ORAL | Status: DC

## 2014-06-13 MED ORDER — LISINOPRIL 5 MG PO TABS: ORAL_TABLET | ORAL | Status: DC

## 2014-06-13 NOTE — Progress Notes (Signed)
   Subjective:    Patient ID: Amanda Osborne, female    DOB: 09-Jul-1951, 63 y.o.   MRN: [id number]  HPI  This patient was seen today for chronic pain  The medication list was reviewed and updated.   -Compliance with pain medication: yes- Oxycodone  The patient was advised the importance of maintaining medication and not using illegal substances with these.  Refills needed: yes  The patient was educated that we can provide 3 monthly scripts for their medication, it is their responsibility to follow the instructions.  Side effects or complications from medications: no  Patient is aware that pain medications are meant to minimize the severity of the pain to allow their pain levels to improve to allow for better function. They are aware of that pain medications cannot totally remove their pain.  Due for UDT ( at least once per year) : This fall       Review of Systems  Constitutional: Negative for activity change, appetite change and fatigue.  Gastrointestinal: Negative for abdominal pain.  Neurological: Negative for headaches.  Psychiatric/Behavioral: Negative for behavioral problems.       Objective:   Physical Exam  Vitals reviewed. Constitutional: She appears well-nourished. No distress.  HENT:  Head: Normocephalic.  Cardiovascular: Normal rate, regular rhythm and normal heart sounds.   No murmur heard. Pulmonary/Chest: Effort normal and breath sounds normal.  Musculoskeletal: She exhibits no edema.  Lymphadenopathy:    She has no cervical adenopathy.  Neurological: She is alert.  Psychiatric: Her behavior is normal.          Assessment & Plan:  Chronic back pain prescriptions given patient denies abusing the medication. States overall she is doing well with it. She will followup in 3 months.

## 2014-06-16 ENCOUNTER — Ambulatory Visit: Payer: Medicare Other | Admitting: Family Medicine

## 2014-09-10 ENCOUNTER — Ambulatory Visit (INDEPENDENT_AMBULATORY_CARE_PROVIDER_SITE_OTHER): Payer: Medicare Other | Admitting: Family Medicine

## 2014-09-10 ENCOUNTER — Encounter: Payer: Self-pay | Admitting: Family Medicine

## 2014-09-10 VITALS — BP 136/80 | Ht 65.5 in | Wt 173.0 lb

## 2014-09-10 DIAGNOSIS — M549 Dorsalgia, unspecified: Secondary | ICD-10-CM

## 2014-09-10 DIAGNOSIS — I10 Essential (primary) hypertension: Secondary | ICD-10-CM

## 2014-09-10 DIAGNOSIS — G8929 Other chronic pain: Secondary | ICD-10-CM

## 2014-09-10 DIAGNOSIS — M5090 Cervical disc disorder, unspecified, unspecified cervical region: Secondary | ICD-10-CM

## 2014-09-10 MED ORDER — OXYCODONE HCL 5 MG PO TABS: 5.0000 mg | ORAL_TABLET | Freq: Four times a day (QID) | ORAL | Status: DC | PRN

## 2014-09-10 MED ORDER — TIZANIDINE HCL 4 MG PO TABS: 4.0000 mg | ORAL_TABLET | Freq: Three times a day (TID) | ORAL | Status: DC | PRN

## 2014-09-10 NOTE — Patient Instructions (Signed)

## 2014-09-10 NOTE — Addendum Note (Signed)
Addended by: Sallee Lange A on: 09/10/2014 09:07 PM   Modules accepted: Orders

## 2014-09-10 NOTE — Progress Notes (Signed)
   Subjective:    Patient ID: Amanda Osborne, female    DOB: Jun 30, 1951, 63 y.o.   MRN: [id number]  HPI This patient was seen today for chronic pain  The medication list was reviewed and updated.   -Compliance with pain medication: Yes  The patient was advised the importance of maintaining medication and not using illegal substances with these.  Refills needed: Yes  The patient was educated that we can provide 3 monthly scripts for their medication, it is their responsibility to follow the instructions.  Side effects or complications from medications: Yes, with the Fosamax and Fe.   Patient is aware that pain medications are meant to minimize the severity of the pain to allow their pain levels to improve to allow for better function. They are aware of that pain medications cannot totally remove their pain.  Due for UDT ( at least once per year) : Next visit  Patient says her left knee will come out of joint every now and then.  Also would like to discuss a muscle relaxer for the muscles in her neck/shoulders.  Declines flu vaccine.  Has pain, occipital neuralgia like pain, then pain into the shoulders as well as the back of the head. Hurts to lay, causes her to sleep on her side. Present the past few weeks  9/23 was when she got her last prescription filled   25 minutes was spent discussing her headaches as well as posterior neck pain and options.   Review of Systems    patient denies chest pain shortness breath nausea vomiting or diarrhea Objective:   Physical Exam Has significant curve with the spine Pain in upper neck Radiates into the trapezius bilateral      Assessment & Plan:  Neurology- botox probably helpful, we will try to set her up with a neurologist that does injections. This patient is having nystatin neck spasms and pain that massage and muscle relaxants have not helped she is arty seen a neurosurgeon who told her that she had an operable neck trouble.  The muscle spasms in the upper trapezius trigger posterior headaches.  Chronic pain-she does not want to go up on pain medicine she states she takes up to 4 per day she denies abusing them. She does states it helps her function. 3 prescriptions were written.  HTN-blood pressure under decent control currently, because of her medication she does need to check a metabolic 7.  She was advised to come in for a wellness visit.  This patient should followup again in 3-4 months.

## 2014-09-16 ENCOUNTER — Encounter: Payer: Self-pay | Admitting: Neurology

## 2014-09-16 ENCOUNTER — Telehealth: Payer: Self-pay | Admitting: *Deleted

## 2014-09-16 ENCOUNTER — Ambulatory Visit (INDEPENDENT_AMBULATORY_CARE_PROVIDER_SITE_OTHER): Payer: Medicare Other | Admitting: Neurology

## 2014-09-16 VITALS — BP 123/81 | HR 73 | Temp 97.0°F | Ht 66.5 in | Wt 172.0 lb

## 2014-09-16 DIAGNOSIS — R1310 Dysphagia, unspecified: Secondary | ICD-10-CM

## 2014-09-16 DIAGNOSIS — M5481 Occipital neuralgia: Secondary | ICD-10-CM | POA: Insufficient documentation

## 2014-09-16 DIAGNOSIS — G243 Spasmodic torticollis: Secondary | ICD-10-CM | POA: Insufficient documentation

## 2014-09-16 NOTE — Patient Instructions (Signed)
As far as your medications are concerned, I would like to suggest: Tegretol start 100mg  twice a day and can increase to 200mg  twice a day. Come back and we can do occipital nerve blocks and trigger point injections.   As far as diagnostic testing: Swallow Study at [facility]  I would like to see you back after you see Dr. Wolfgang Phoenix, sooner if we need to. Please call us with any interim questions, concerns, problems, updates or refill requests.   Please also call us for any test results so we can go over those with you on the phone.  My clinical assistant and will answer any of your questions and relay your messages to me and also relay most of my messages to you.   Our phone number is [phone]. We also have an after hours call service for urgent matters and there is a physician on-call for urgent questions. For any emergencies you know to call 911 or go to the nearest emergency room

## 2014-09-16 NOTE — Telephone Encounter (Signed)
Patient was scheduled for Modified Barium Swallowing test at [facility], informed patient that she has an appointment on Wednesday 10/28 at 8:45 am, also informed patient that she is to be NPO 4 hours before procedure. Patient verified understanding and had no further questions or concerns.

## 2014-09-16 NOTE — Progress Notes (Addendum)
[facility]    Provider:  Dr Jaynee Eagles Referring Provider: Kathyrn Drown, MD Primary Care Physician:  Sallee Lange, MD  CC:  Occipital neuralgia and nack pain  HPI:  Amanda Osborne is a 63 y.o. female here as a referral from Dr. Wolfgang Phoenix for occipital neuralgia and cervical muscle pain. Patient has had  occipital neuralgia since 2000 that was treated with cervical muscle trigger-point injections, pain meds(percocet and oxycodone) and occipital nerve blocks. This seemed to help however when she lost her insurance she could not have the treatment anymore. The symptoms always started in a similar manner with pressure in the back of the head and tenderness followed by cervical muscle tightness. She can actually feel the muscles tightening in the neck and the pain going up her neck and the back of her head. She underwent anterior cervical decompression C5-C6 arthrodesis with structural allograft, anterior plate fixation E5-I7 and removal of previously placed plate at P8-E4 in September 2014. She was hoping this would help with her symptoms.   In February/March timeframe she had an episode of occipital neuralgia with significantly worsened neck pain that lasted several months and in May she had an MRI of the cervical spine without acute findings. She says at that time her cervical muscles were so sore that she "looked like a gargoyle". She always has cervical muscle tenderness and decreased ROM. Her neck muscles are incredibly tight and painful. She can't move her neck as well as she would like. This is extremely painful and continuous but she has flares where the muscles tighten even more and she looks like a gargoyle because she can see the big muscle bumps on her shoulders. She cries in the office today. This has been going on for many, many years without relief. She has tried PT, muscle relaxants, heat, massage, pain medications without relief.   She was referred to our office  today for treatment of her symptoms.  She started having the pressure in the back of the head last week with soreness and tenderness in the occipital area, then the next day she had increased tightness in the cervical muscles more than usual. The symptoms have significantly progressed. Today her back is swollen and she feels her voice is affected. She has decreased ROM in the neck. She has new symptoms today however including hoarseness of her throat, flu-like symptoms and dysphagia. She feels like there is pressure on the back of her throat and she is having difficulty swallowing. She feels throbbing on the back of her neck and swelling and nflammation.    Review of Systems: Patient complains of symptoms per HPI as well as the following symptoms:Anemia, fatigue, flushing, ringing in ears, trouble swallowing, constipation, joint pain, headache, numbness, difficulty swallowing, anxiety. Pertinent negatives per HPI. All others negative.   History   Social History  . Marital Status: Divorced    Spouse Name: N/A    Number of Children: 1  . Years of Education: HS   Occupational History  . retired    Social History Main Topics  . Smoking status: Never Smoker   . Smokeless tobacco: Never Used  . Alcohol Use: No  . Drug Use: No  . Sexual Activity: Not on file   Other Topics Concern  . Not on file   Social History Narrative   Patient lives at home alone.   Caffeine Use: daily (BC podwder)    Family History  Problem Relation Age of Onset  . Stroke  Mother   . Heart disease Mother   . Diabetes Mother   . Heart disease Father   . Cancer - Colon Father   . Other Brother     Past Medical History  Diagnosis Date  . Wrist fracture, bilateral 2005 and 2010    right then left  . Cervical disc herniation     c5 and c6  . Narcolepsy   . Occipital neuralgia   . Iron deficiency anemia 11/30/2012  . Unspecified vitamin D deficiency 03/26/2013  . Chronic pain   . Fibromyalgia   .  Complication of anesthesia   . PONV (postoperative nausea and vomiting)   . Hypertension   . Anxiety   . Headache(784.0)     Hx: of migraines until age 71's  . GERD (gastroesophageal reflux disease)   . Arthritis   . Hyperlipidemia     Past Surgical History  Procedure Laterality Date  . Anterior cervical decomp/discectomy fusion  2000  . Gastric bypass  1981  . Bone graft hip iliac crest  2006    to right wrist to correct fracture  . Fracture surgery      Hx: of right wrist surgery  . Tonsillectomy    . Colonoscopy      Hx; of  . Dilation and curettage of uterus    . Anterior cervical decomp/discectomy fusion N/A 08/05/2013    Procedure: Cervical five-six Anterior cervical decompression/diskectomy/fusion/ Synthes plate removal;  Surgeon: Kristeen Miss, MD;  Location: [facility];  Service: Neurosurgery;  Laterality: N/A;    Current Outpatient Prescriptions  Medication Sig Dispense Refill  . acetaminophen (TYLENOL) 500 MG tablet Take 500 mg by mouth every 6 (six) hours as needed.      . ALPRAZolam (XANAX) 0.5 MG tablet Take 1 tablet (0.5 mg total) by mouth 3 (three) times daily as needed.  90 tablet  4  . Aspirin-Caffeine (BC FAST PAIN RELIEF) 845-65 MG PACK Take 1 packet by mouth daily.       . Cholecalciferol (VITAMIN D3) 10000 UNITS capsule Take 5,000 Units by mouth daily.       . Ferrous Fumarate (FERRO-SEQUELS PO) Take by mouth every other day.      . hydrochlorothiazide (HYDRODIURIL) 25 MG tablet take 1 tablet by mouth once daily  90 tablet  3  . ibuprofen (ADVIL,MOTRIN) 200 MG tablet Take 400 mg by mouth 3 (three) times daily.      Marland Kitchen lisinopril (PRINIVIL,ZESTRIL) 5 MG tablet take 1 tablet once daily  90 tablet  3  . Multiple Vitamin (MULTIVITAMIN WITH MINERALS) TABS tablet Take 1 tablet by mouth daily.      Marland Kitchen omeprazole (PRILOSEC) 20 MG capsule Take 20 mg by mouth daily.      Marland Kitchen oxyCODONE (OXY IR/ROXICODONE) 5 MG immediate release tablet Take 1 tablet (5 mg total) by mouth  every 6 (six) hours as needed for severe pain.  120 tablet  0  . polyethylene glycol powder (GLYCOLAX/MIRALAX) powder Take 17 g by mouth as needed (for constipation).       Marland Kitchen tiZANidine (ZANAFLEX) 4 MG tablet Take 1 tablet (4 mg total) by mouth every 8 (eight) hours as needed for muscle spasms.  30 tablet  1   No current facility-administered medications for this visit.    Allergies as of 09/16/2014 - Review Complete 09/16/2014  Allergen Reaction Noted  . Celexa [citalopram hydrobromide] Nausea And Vomiting 05/01/2013  . Neurontin [gabapentin]  03/26/2013  . Soma [carisoprodol]  09/10/2014  Vitals: BP 123/81  Pulse 73  Temp(Src) 97 F (36.1 C) (Oral)  Ht 5' 6.5" (1.689 m)  Wt 172 lb (78.019 kg)  BMI 27.35 kg/m2 Last Weight:  Wt Readings from Last 1 Encounters:  09/16/14 172 lb (78.019 kg)   Last Height:   Ht Readings from Last 1 Encounters:  09/16/14 5' 6.5" (1.689 m)   Physical exam: Exam: Gen: Moderate distress, well nourised, obese, well groomed                     CV: RRR,  Eyes: Conjunctivae clear without exudates or hemorrhage HEENT: Tissue edema of the cervical muscles and anterior chest. Hoarseness of the voice leads me to suspect she may have edema and swelling around esophagus and recurrent laryngeal nerve or laryngeal edema and swelling MSK: Decreased ROM neck, horizontal turning of the head (torticollis), dystonic tremor of the head, severely dystonic cervical muscles in particular the upper trapezius   Neuro: Detailed Neurologic Exam  Speech:    Speech is fluent and spontaneous with normal comprehension.  Cognition:    The patient is oriented to person, place, and time;     recent and remote memory intact;     language fluent;     normal attention, concentration,     fund of knowledge Cranial Nerves:    The pupils are equal, round, and reactive to light. Visual fields are full to finger confrontation. Extraocular movements are intact.  The face is  symmetric. The palate elevates in the midline. Voice is hoarse. Shoulder shrug is normal. The tongue has normal motion without fasciculations.   Coordination:    No dysmetria noted  Gait:   Normal native gait  Motor Observation:    horizontal turning of the head (torticollis), dystonic tremor of the head, severely dystonic cervical muscles in particular the upper trapezius  Tone:    Normal muscle tone.    Posture:    Posture is normal.     Strength:    Strength is intact in the upper and lower limbs.      Sensation: intact to LT         Assessment/Plan:  Amanda Osborne is a 63 y.o. female here as a referral from Dr. Wolfgang Phoenix for occipital neuralgia and cervical muscle pain. Patient has had  occipital neuralgia since 2000 that was treated with cervical muscle trigger-point injections, pain meds(percocet and oxycodone) and occipital nerve blocks.  She underwent anterior cervical decompression C5-C6 arthrodesis with structural allograft, anterior plate fixation B3-Z3 and removal of previously placed plate at G9-J2 in September 2014. She was hoping this would help with her symptoms.   She was referred to our office today for treatment of her symptoms. She is here with soreness and tenderness in the occipital area and tightness in the cervical muscles.  She has horizontal turning of the head (torticollis), dystonic tremor of the head, severely dystonic cervical muscles in particular the upper trapezius, decreased ROM of the neck consistent with painful Spasmodic Torticollis. She has tried multiple interventions to help including muscle relaxants (zanaflex, flexeril), narcotic pain medications, massage, heat, physical therapy without relief.    Today she reports her back is swollen and she feels her voice is affected because she is in so much pain from the tightened musc;es in her back. She has decreased ROM in the neck. She has unusual symptoms today including hoarseness of her throat,  flu-like symptoms and dysphagia. She feels like there is pressure on the  back of her throat and she is having difficulty swallowing. She feels throbbing on the back of her neck and swelling and inflammation.   On exam there is tissue edema of the cervical muscles and anterior chest. Hoarseness of the voice is concerning for edema and swelling around esophagus and recurrent laryngeal nerve or laryngeal edema and swelling. She had markedly decreased ROM in the neck.   I think patient would benefit from occipital nerve blocks and cervical trigger point injections for her occipital neuralgia and spasmotic torticollis. Botox would be very helpful and can try to have that approved.  However I hesitate to perform this today given the atypical symptoms of hoarseness, tissue edema of both the cervical and chest areas, flu-like symptoms and dysphagia. Discussed with Dr. Wolfgang Phoenix and she will return to his clinic at 2pm tomorrow. I have advised patient to go to the emergency room should her symptoms worsen tonight. We have scheduled a barium swallow test for her at [facility] tomorrow morning to assess her swallowing function. She denies any breathing difficulties. I have offered to have her back to this office anytime she would like to come and we will waive the copay waived, I will open up an appointment anytime for her if we can rule out other causes for her symptoms.   Will prescribe tegretol for the occipital neuralgia. May also help with overall pain. She is taking Zanaflex already for the muscle spasms and spasmodic torticollis. She has Oxycodone at home to take tonight for the pain.   Sarina Ill, MD  [facility] [address] [place], [place] [address]  Phone [phone] Fax [phone]  Lenor Coffin

## 2014-09-17 ENCOUNTER — Ambulatory Visit (HOSPITAL_COMMUNITY)
Admission: RE | Admit: 2014-09-17 | Discharge: 2014-09-17 | Disposition: A | Discharge status: Home / Self Care | Payer: Medicare Other | Source: Ambulatory Visit | Attending: Neurology | Admitting: Neurology

## 2014-09-17 ENCOUNTER — Other Ambulatory Visit (HOSPITAL_COMMUNITY): Payer: Medicare Other

## 2014-09-17 ENCOUNTER — Ambulatory Visit (INDEPENDENT_AMBULATORY_CARE_PROVIDER_SITE_OTHER): Payer: Medicare Other | Admitting: Family Medicine

## 2014-09-17 ENCOUNTER — Other Ambulatory Visit: Payer: Self-pay | Admitting: Neurology

## 2014-09-17 ENCOUNTER — Encounter: Payer: Self-pay | Admitting: Family Medicine

## 2014-09-17 ENCOUNTER — Telehealth: Payer: Self-pay | Admitting: Neurology

## 2014-09-17 VITALS — BP 110/80 | Temp 98.1°F | Ht 65.5 in | Wt 171.0 lb

## 2014-09-17 DIAGNOSIS — R1314 Dysphagia, pharyngoesophageal phase: Secondary | ICD-10-CM

## 2014-09-17 DIAGNOSIS — R1310 Dysphagia, unspecified: Secondary | ICD-10-CM

## 2014-09-17 LAB — CBC WITH DIFFERENTIAL/PLATELET
Basophils Absolute: 0 10*3/uL (ref 0.0–0.1)
Basophils Relative: 0 % (ref 0–1)
EOS PCT: 5 % (ref 0–5)
Eosinophils Absolute: 0.3 10*3/uL (ref 0.0–0.7)
HCT: 41.7 % (ref 36.0–46.0)
HEMOGLOBIN: 14.1 g/dL (ref 12.0–15.0)
LYMPHS PCT: 20 % (ref 12–46)
Lymphs Abs: 1.1 10*3/uL (ref 0.7–4.0)
MCH: 29.9 pg (ref 26.0–34.0)
MCHC: 33.8 g/dL (ref 30.0–36.0)
MCV: 88.3 fL (ref 78.0–100.0)
Monocytes Absolute: 0.4 10*3/uL (ref 0.1–1.0)
Monocytes Relative: 7 % (ref 3–12)
NEUTROS ABS: 3.8 10*3/uL (ref 1.7–7.7)
Neutrophils Relative %: 68 % (ref 43–77)
Platelets: 332 10*3/uL (ref 150–400)
RBC: 4.72 MIL/uL (ref 3.87–5.11)
RDW: 13.6 % (ref 11.5–15.5)
WBC: 5.6 10*3/uL (ref 4.0–10.5)

## 2014-09-17 NOTE — Telephone Encounter (Signed)
Bethena Roys from [facility] calling to verify that patient needs a MODIFIED barium swallow and not just a normal barium swallow, please call her back as soon as possible, she can be reached at [phone] or [phone].

## 2014-09-17 NOTE — Telephone Encounter (Signed)
This morning I was called from [facility] and they said they could not do a barium swallow study that morning(they made a mistake when scheduling) but could do just a swallow study if I placed a new order. I spoke to the radiologist and we discussed the differences and then  I placed the order and we called back to verify they had it. Patient said they told her the study was placed and she was going back soon but the wait was so long that she had to leave. Spoke to patient at length tonight and she is still having difficulty swallowing as well as throat hoarseness and swelling. Dr. Wolfgang Phoenix placed a new swallow study order and she is going back tomorrow hopefully.

## 2014-09-17 NOTE — Addendum Note (Signed)
Addended by: Sarina Ill B on: 09/17/2014 01:15 PM   Modules accepted: Orders

## 2014-09-17 NOTE — Progress Notes (Signed)
   Subjective:    Patient ID: Amanda Osborne, female    DOB: [birth year], 63 y.o.   MRN: [id number]  HPI Patient states that she is here today because her neurologist talked to Dr. Richardson Landry yesterday and recommended that she come in and be seen. She has been having difficulty swallowing and she has edema in her tissues.  Patient states she has no other concerns at this time.  25 minutes was spent with this patient reviewing over the notes from the neurologist as well as discussing her symptoms as well as helping her reset up the tests that she did not stay for at the hospital. Review of Systems  Constitutional: Negative for fever, appetite change and fatigue.  Respiratory: Negative for shortness of breath.   Gastrointestinal:       Dysphagia  Neurological: Positive for weakness.       Objective:   Physical Exam  Vitals reviewed. Constitutional: She appears well-nourished. No distress.  HENT:  Head: Normocephalic.  Mouth/Throat: Oropharynx is clear and moist. No oropharyngeal exudate.  Neck: Normal range of motion.  Cardiovascular: Normal rate, regular rhythm and normal heart sounds.   No murmur heard. Pulmonary/Chest: Effort normal and breath sounds normal. No respiratory distress.  Musculoskeletal: She exhibits no edema.  Lymphadenopathy:    She has no cervical adenopathy.  Neurological: She is alert. She exhibits normal muscle tone.  Psychiatric: Her behavior is normal.          Assessment & Plan:  This patient is concerned with possible problems in her esophagus she feels like she has some much pain in her neck and swelling in the back part of her neck that it's causing her difficulty to swallow I believe this patient should go forth with the tests that neurologist scheduled. The patient got frustrated when they could not see her right away. Therefore we will reschedule that she will go back to get these done. She will also do lab work to check her kidney function and her  white blood count. Significant time spent with this patient discussing these issues I do not feel she needs EGD currently if her tests come back looking good then if she has ongoing troubles referral to gastroenterology for EGD and dilation

## 2014-09-17 NOTE — Telephone Encounter (Signed)
Dr Jaynee Eagles spoke with [name] in regards to patient's test.

## 2014-09-18 LAB — BASIC METABOLIC PANEL
BUN: 23 mg/dL (ref 6–23)
CHLORIDE: 102 meq/L (ref 96–112)
CO2: 26 meq/L (ref 19–32)
CREATININE: 0.54 mg/dL (ref 0.50–1.10)
Calcium: 9.2 mg/dL (ref 8.4–10.5)
Glucose, Bld: 75 mg/dL (ref 70–99)
Potassium: 4.3 mEq/L (ref 3.5–5.3)
Sodium: 137 mEq/L (ref 135–145)

## 2014-09-18 LAB — TSH: TSH: 2.164 u[IU]/mL (ref 0.350–4.500)

## 2014-09-23 ENCOUNTER — Telehealth: Payer: Self-pay | Admitting: Family Medicine

## 2014-09-23 ENCOUNTER — Telehealth: Payer: Self-pay | Admitting: Neurology

## 2014-09-23 MED ORDER — PREDNISONE 20 MG PO TABS: ORAL_TABLET | ORAL | Status: DC

## 2014-09-23 NOTE — Telephone Encounter (Signed)
Thank you for your kindness and help of our shared patient.

## 2014-09-23 NOTE — Telephone Encounter (Signed)
May do a short course of prednisone 20 mg, 3 daily for 2 days, 2 daily for 2 days, 1 daily for 2 days. Also asked the patient when she is having her swallow done? The neurologist stated that they will be happy to see her but we cannot schedule the follow-up visit with  Neurology until we have the swallow test results.hold off on anti-inflammatory while on prednisone.

## 2014-09-23 NOTE — Telephone Encounter (Signed)
Called to see how patient was feeling. Spoke to patient. She says inflammation has traveled further into her chest wall and now she has trigger points in her breasts, a "hot poker feeling" in the muscles of the breasts. She is wearing a cervical collar. But the swelling has improved.Her voice has improved.  Will put in an order for botox. However patient says she will have a hard time paying the copay for the botox as well as the copay for the office visits every 3 months. There is a cervical dystonia fund which would help pay for costs if her application was approved. Will contact our pharmacy representative and see if we can start the process.   She has a swallow study pending.

## 2014-09-23 NOTE — Telephone Encounter (Signed)
Thank you so much Dr. Wolfgang Phoenix. Georgia Dom, MD

## 2014-09-23 NOTE — Telephone Encounter (Signed)
Pt is still having so much inflammation at or around her neck,  She wants to know if she should maybe try a prednisone or steroid type Medicine in addition to her pain meds?   Please advise  [facility]

## 2014-09-23 NOTE — Telephone Encounter (Signed)
Rx sent electronically to pharmacy. Patient notified. Patient stated she never heard anything else about the swallowing test being rescheduled -originally ordered by Dr Jannifer Franklin office but the swallowing and hoarseness is better. Patient stated she is going to have chest xray today. Patient said the inflammation in her neck going into her back is horrible now.

## 2014-09-23 NOTE — Telephone Encounter (Signed)
Spoke with therapist and they will call patient today to schedule swallowing test. Patient notified.

## 2014-09-23 NOTE — Telephone Encounter (Signed)
Patient seen for neck and back pain 09/10/14 and 09/17/14 given oxycodone 5 mg amd zanaflex

## 2014-09-25 ENCOUNTER — Ambulatory Visit (HOSPITAL_COMMUNITY)
Admission: RE | Admit: 2014-09-25 | Discharge: 2014-09-25 | Disposition: A | Discharge status: Home / Self Care | Payer: Medicare Other | Source: Ambulatory Visit | Attending: Family Medicine | Admitting: Family Medicine

## 2014-09-25 ENCOUNTER — Encounter (HOSPITAL_COMMUNITY): Payer: Self-pay | Admitting: Speech Pathology

## 2014-09-25 DIAGNOSIS — R1314 Dysphagia, pharyngoesophageal phase: Secondary | ICD-10-CM | POA: Diagnosis not present

## 2014-09-25 DIAGNOSIS — Z5189 Encounter for other specified aftercare: Secondary | ICD-10-CM | POA: Diagnosis not present

## 2014-09-25 DIAGNOSIS — R1310 Dysphagia, unspecified: Secondary | ICD-10-CM | POA: Diagnosis not present

## 2014-09-25 NOTE — Therapy (Signed)
Speech Language Pathology Evaluation  Patient Details  Name: Amanda Osborne MRN: [id number] Date of Birth: 04/02/1951  Encounter Date: 09/25/2014      End of Session - 09/25/14 1742    Visit Number 1   Number of Visits 1   Authorization Type [employer] Medicare   Authorization Time Period 09/25/2014   Authorization - Visit Number 1   Authorization - Number of Visits 1   SLP Start Time 1300   SLP Stop Time 4098   SLP Time Calculation (min) 38 min   SLP Charge Details MBSS   Activity Tolerance Patient tolerated treatment well      Past Medical History  Diagnosis Date  . Wrist fracture, bilateral 2005 and 2010    right then left  . Cervical disc herniation     c5 and c6  . Narcolepsy   . Occipital neuralgia   . Iron deficiency anemia 11/30/2012  . Unspecified vitamin D deficiency 03/26/2013  . Chronic pain   . Fibromyalgia   . Complication of anesthesia   . PONV (postoperative nausea and vomiting)   . Hypertension   . Anxiety   . Headache(784.0)     Hx: of migraines until age 39's  . GERD (gastroesophageal reflux disease)   . Arthritis   . Hyperlipidemia     Past Surgical History  Procedure Laterality Date  . Anterior cervical decomp/discectomy fusion  2000  . Gastric bypass  1981  . Bone graft hip iliac crest  2006    to right wrist to correct fracture  . Fracture surgery      Hx: of right wrist surgery  . Tonsillectomy    . Colonoscopy      Hx; of  . Dilation and curettage of uterus    . Anterior cervical decomp/discectomy fusion N/A 08/05/2013    Procedure: Cervical five-six Anterior cervical decompression/diskectomy/fusion/ Synthes plate removal;  Surgeon: Kristeen Miss, MD;  Location: [facility];  Service: Neurosurgery;  Laterality: N/A;    There were no vitals taken for this visit.  Visit Diagnosis: Dysphagia  HPI: Amanda Osborne is a 63 y.o. female here as a referral from Dr. Sarina Ill for a modified barium swallow study due to pt with recent  complaints of difficulty swallowing and hoarse vocal quality. She was referred to Dr. Jaynee Eagles for occipital neuralgia and cervical muscle pain. Patient has had occipital neuralgia since 2000 that was treated with cervical muscle trigger-point injections, pain meds(percocet and oxycodone) and occipital nerve blocks.  She underwent anterior cervical decompression C5-C6 arthrodesis with structural allograft, anterior plate fixation J1-B1 and removal of previously placed plate at Y7-W2 in September 2014. She was hoping this would help with her symptoms. Pt reports to SLP that as she was driving to [facility] last Tuesday to see Dr. Jaynee Eagles, she lost her voice (came back slowly over the next couple of days). She describes her swallowing difficulties as effortful and with pressure. She also states that it is a little better this week. Her pain is better today, but still bothersome. She plans to follow up with Dr. Jaynee Eagles after completion of this swallow study.  Oral Preparation/Oral Phase        09/25/14 1730                   Oral Preparation/Oral Phase   Oral Phase  WFL             mildly prolong- ed oral phase with regular textures             -  DP           Electrical stimulation - Oral Phase   Was Electrical Stimulation Used  No             -DP           Pharyngeal Phase        09/25/14 1730                   Pharyngeal Phase   Pharyngeal Phase  Impaired             -DP           Pharyngeal - Thin   Pharyngeal - Thin Cup  Premature spil- lage to vallec- ulae             -DP           Pharyngeal - Thin Straw  Premature spil- lage to vallec- ulae;Premature spillage to pyriform sinus- es;Penetration/ Aspiration dur- ing swallow             -DP           Penetration/Aspiration details (thin straw)  Material does not enter airw- ay;Material enters airway, remains ABOVE vocal cords then ejected out             -DP           Pharyngeal - Solids    Pharyngeal - Puree  Delayed swallow initiation; Premature spil- lage to vallec- ulae             -DP           Pharyngeal - Regular  Delayed swallow initiation; Premature spil- lage to vallec- ulae             -DP           Pharyngeal - Pill  Premature spil- lage to pyrifo- rm sinuses             see impression; penetrated thins with pill             -DP           Pharyngeal Phase - Comment   Pharyngeal Comment             Electrical Stimulation - Pharyngeal Phase   Was Electrical Stimulation Used  No           Cervical Esophageal Phase    Cervical Esophageal Phase  WFL             brief delay in transfer of pill through UES likely imp- acted by C5-6 hardware             CLINICAL IMPRESSION  Overall swallow function is essentially WFL, however pt with min premature spillage with liquids which resulted in flash penetration during the swallow when drinking from a straw. Pt had brief stasis of barium tablet at the level of the UES and penetrated thin liquids when attempting to swallow the pill. This is likely due to C5-6 hardware. The patient was unaware of the delayed transit and penetration was silent. Pharyngeal clearance was normal as pt had no residuals post swallow. Results of evaluation reviewed with pt and questions answered. Her hoarse vocal quality has resolved. Perhaps pt's acute neuralgia last week had some impact on her recurrent laryngeal nerve and caused hoarseness and discomfort with swallowing. Recommed regular textures with thin liquids. Pt advised to thoroughly masticate solids (she states she always does) to avoid large pieces having to enter UES. Incidentally, pt  reports gastric bipass surgery many years ago and consumes small meals. No further SLP f/u indicated at this time. Will route results with Dr. Jaynee Eagles and Dr. Wolfgang Phoenix. Thank you for this referral.        G-Codes - 10-05-14 1744    Functional Assessment Tool Used MBSS   Functional  Limitations Swallowing   Swallow Current Status (Y1117) 0 percent impaired, limited or restricted   Swallow Goal Status (B5670) 0 percent impaired, limited or restricted   Swallow Discharge Status [phone]) 0 percent impaired, limited or restricted      Problem List Patient Active Problem List   Diagnosis Date Noted  . Occipital neuralgia 09/16/2014  . Spasmodic torticollis 09/16/2014  . HTN (hypertension), benign 03/17/2014  . Thyroid nodule 03/17/2014  . Right ankle pain 12/20/2013  . Spondylosis, cervical, with myelopathy C5-6 08/06/2013  . Kyphosis 05/02/2013  . Hyperlipidemia 05/02/2013  . Chronic back pain 03/29/2013  . Generalized anxiety disorder 03/29/2013  . Depression with anxiety 03/29/2013  . H/O gastric bypass 03/29/2013  . Unspecified vitamin D deficiency 03/26/2013  . Iron deficiency anemia 11/30/2012     Thank you,  Genene Churn, [place]                 [place] 2014-10-05, 5:54 PM

## 2014-10-01 ENCOUNTER — Other Ambulatory Visit: Payer: Self-pay | Admitting: *Deleted

## 2014-10-01 ENCOUNTER — Telehealth: Payer: Self-pay | Admitting: Family Medicine

## 2014-10-01 DIAGNOSIS — D509 Iron deficiency anemia, unspecified: Secondary | ICD-10-CM

## 2014-10-01 MED ORDER — ALPRAZOLAM 0.5 MG PO TABS
0.5000 mg | ORAL_TABLET | Freq: Three times a day (TID) | ORAL | Status: DC | PRN
Start: 2014-10-01 — End: 2015-04-03

## 2014-10-01 NOTE — Telephone Encounter (Signed)
[facility] patient.  Last seen 09/17/14  (We have not received any requests for this med.)

## 2014-10-01 NOTE — Telephone Encounter (Signed)
ALPRAZolam (XANAX) 0.5 MG table  Pt states she has been trying to get this med refilled since 11/5  But the Pharm says we are not responding to them  Please print for pt to hand carry today please   She has been out of this med for sometime now

## 2014-10-01 NOTE — Telephone Encounter (Signed)
Ref plus five monthly ref

## 2014-10-01 NOTE — Telephone Encounter (Signed)
See phone mess let pt know do word from pharm until tod

## 2014-10-01 NOTE — Telephone Encounter (Signed)
Patient notified

## 2014-10-03 ENCOUNTER — Telehealth: Payer: Self-pay | Admitting: Family Medicine

## 2014-10-03 NOTE — Telephone Encounter (Addendum)
Patient needs handicap placard attached to chart signed.   Also, she was inquiring about her pain medication.  She dropped of her Rx for oxycodone (see chart) that is due to be filled on 10/12/14.  She said that at her last appointment Dr. Nicki Reaper had mentioned that we may could increase her pain medication.  She said that she was not interested at the time, but because of the neck pain that she is experiencing at the moment, she wants to know if just for this Rx can the medication be increased?

## 2014-10-04 NOTE — Telephone Encounter (Signed)
This patient may have a prescription for oxycodone 5 mg tablet, one every 4 hours when necessary pain, #180, it would be recommended for this patient to follow-up within  3-4 weeks of starting this so we can discuss how her pain management is going then discuss whether or not to stick at this current dose or adjust differently. Certainly if patient him problems sooner follow-up sooner.

## 2014-10-06 MED ORDER — OXYCODONE HCL 5 MG PO TABS: 5.0000 mg | ORAL_TABLET | ORAL | Status: DC | PRN

## 2014-10-06 NOTE — Telephone Encounter (Signed)
Patient notified the script and handicap form is ready for pickup. Schedule follow up visit in 3-4 weeks. Patient verbalized understanding.

## 2014-10-27 ENCOUNTER — Encounter: Payer: Self-pay | Admitting: Family Medicine

## 2014-10-27 ENCOUNTER — Ambulatory Visit (INDEPENDENT_AMBULATORY_CARE_PROVIDER_SITE_OTHER): Payer: Medicare Other | Admitting: Family Medicine

## 2014-10-27 VITALS — BP 118/70 | Ht 65.5 in | Wt 175.0 lb

## 2014-10-27 DIAGNOSIS — G243 Spasmodic torticollis: Secondary | ICD-10-CM

## 2014-10-27 DIAGNOSIS — I10 Essential (primary) hypertension: Secondary | ICD-10-CM

## 2014-10-27 DIAGNOSIS — M4712 Other spondylosis with myelopathy, cervical region: Secondary | ICD-10-CM

## 2014-10-27 MED ORDER — OXYCODONE HCL 5 MG PO TABS: 5.0000 mg | ORAL_TABLET | ORAL | Status: DC | PRN

## 2014-10-27 NOTE — Patient Instructions (Signed)

## 2014-10-27 NOTE — Progress Notes (Signed)
   Subjective:    Patient ID: Amanda Osborne, female    DOB: July 15, 1951, 63 y.o.   MRN: [id number]  HPI This patient was seen today for chronic pain  The medication list was reviewed and updated.   -Compliance with pain medication: yes  The patient was advised the importance of maintaining medication and not using illegal substances with these.  Refills needed: none   The patient was educated that we can provide 3 monthly scripts for their medication, it is their responsibility to follow the instructions.  Side effects or complications from medications: none  Patient is aware that pain medications are meant to minimize the severity of the pain to allow their pain levels to improve to allow for better function. They are aware of that pain medications cannot totally remove their pain.  Due for UDT ( at least once per year) :   Patient needs a prescription for her omeprazole. Patient has no other concerns at this time.     Review of Systems  Constitutional: Negative for activity change, appetite change and fatigue.  Respiratory: Negative for cough, shortness of breath and stridor.   Cardiovascular: Negative for chest pain.  Gastrointestinal: Negative for abdominal pain.  Endocrine: Negative for polydipsia and polyphagia.  Genitourinary: Negative for frequency.  Musculoskeletal: Positive for back pain and arthralgias.  Neurological: Negative for weakness.  Psychiatric/Behavioral: Negative for confusion.       Objective:   Physical Exam  Constitutional: She appears well-nourished. No distress.  Cardiovascular: Normal rate, regular rhythm and normal heart sounds.   No murmur heard. Pulmonary/Chest: Effort normal and breath sounds normal. No respiratory distress.  Musculoskeletal: She exhibits no edema.  Lymphadenopathy:    She has no cervical adenopathy.  Neurological: She is alert. She exhibits normal muscle tone.  Psychiatric: Her behavior is normal.  Vitals  reviewed.         Assessment & Plan:  Cant afford Lyrica Neurology looking into botox help Continuing pain medication she states that the current level seems to be helping. 3 prescriptions given

## 2014-10-28 ENCOUNTER — Telehealth: Payer: Self-pay | Admitting: Family Medicine

## 2014-10-28 MED ORDER — OMEPRAZOLE 20 MG PO CPDR
20.0000 mg | DELAYED_RELEASE_CAPSULE | Freq: Every day | ORAL | Status: DC
Start: 2014-10-28 — End: 2015-05-01

## 2014-10-28 NOTE — Telephone Encounter (Signed)
Rx sent electronically to pharmacy. Patient notified. 

## 2014-10-28 NOTE — Telephone Encounter (Signed)
Patient needs Rx for prilosec to [facility].

## 2014-11-06 ENCOUNTER — Telehealth: Payer: Self-pay | Admitting: Family Medicine

## 2014-11-06 NOTE — Telephone Encounter (Signed)
Patient said that she heard back from [facility] and she was able to get financial assistance for botox injections for 2016.  Her first injection is scheduled for 12/01/14.

## 2014-11-29 IMAGING — CR DG CHEST 2V
2 series · 2 of 2 positions shown · non-contrast
Comparison: 05/20/2004

CLINICAL DATA: Preoperative evaluation for next surgery

CHEST - 2 VIEW

[w chest pa]
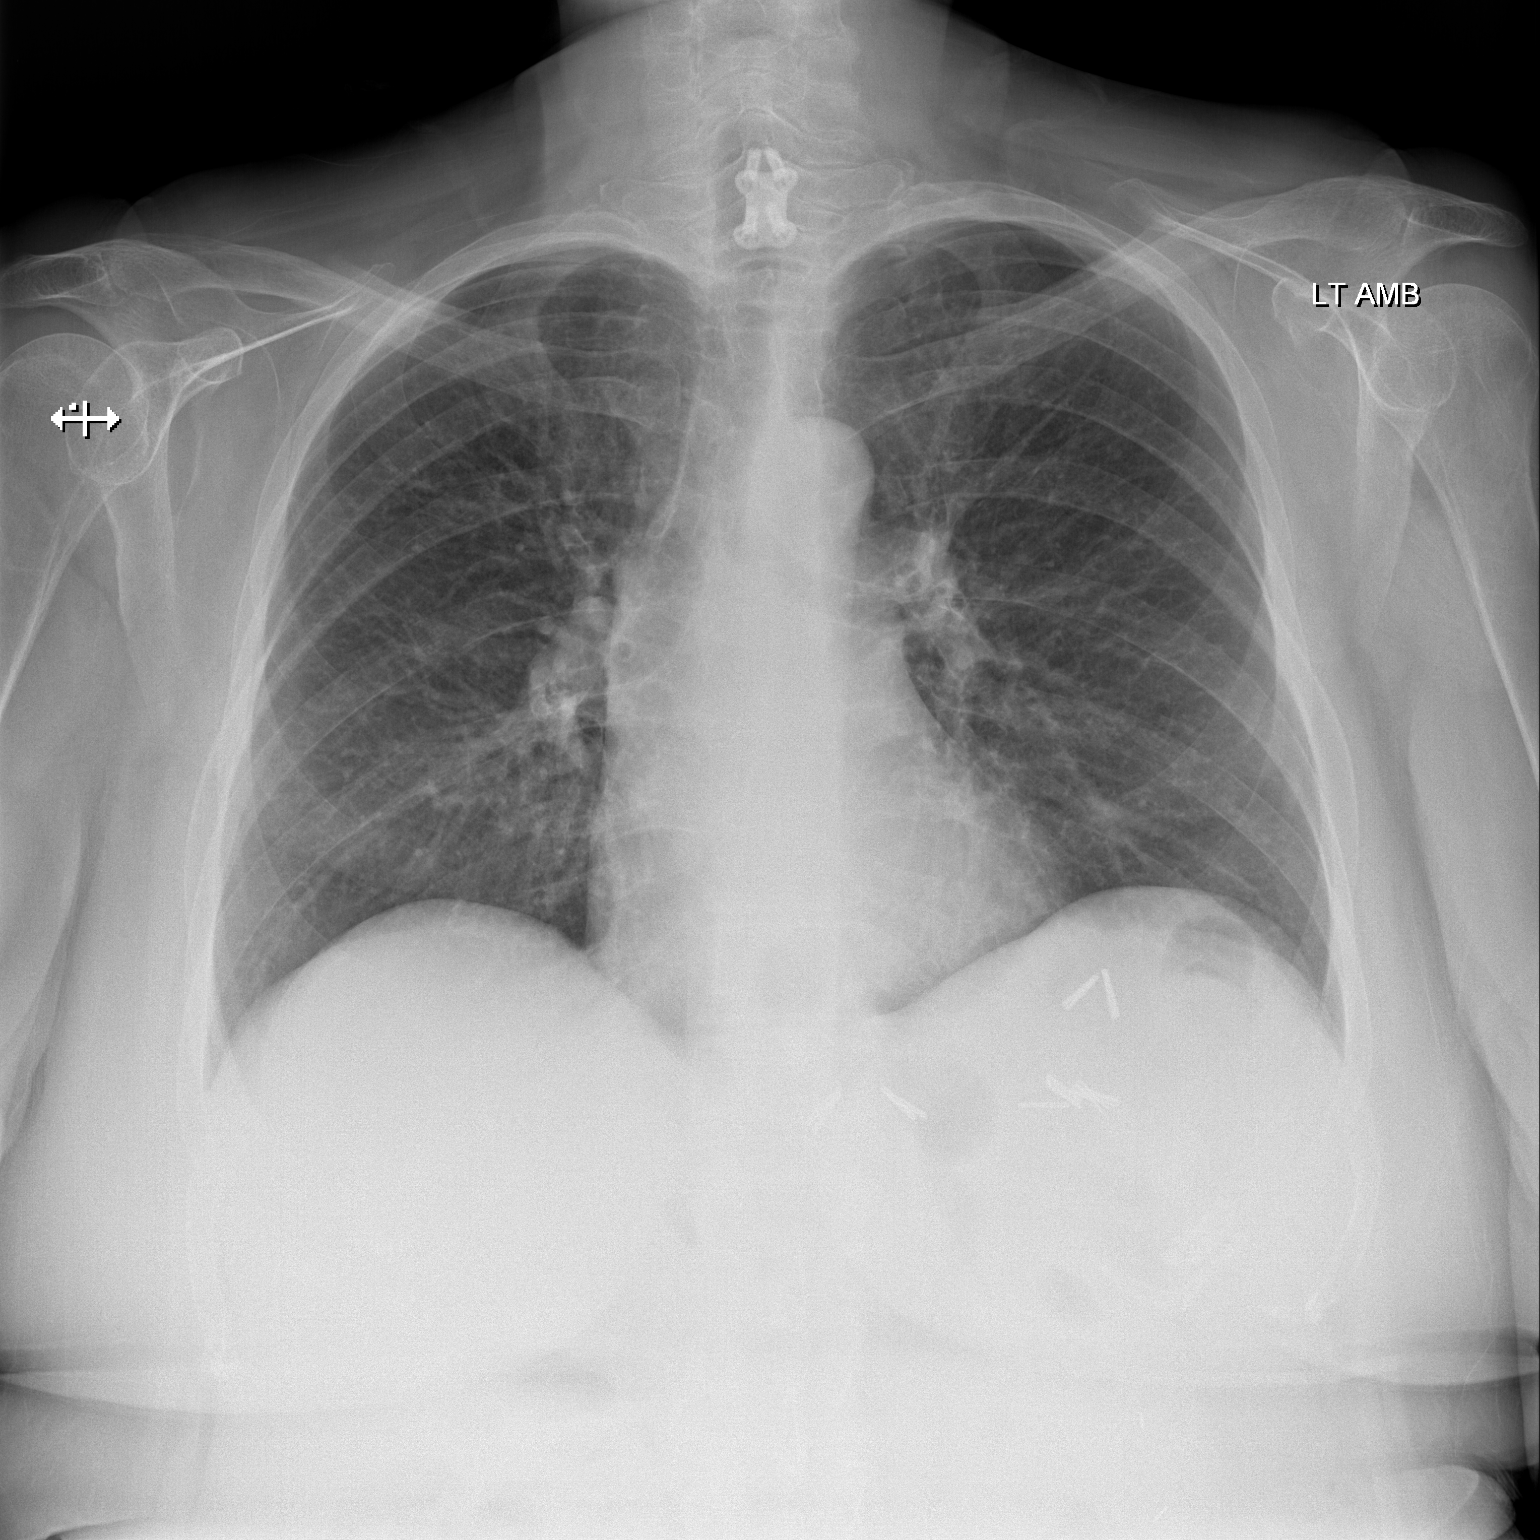

[w chest lat]
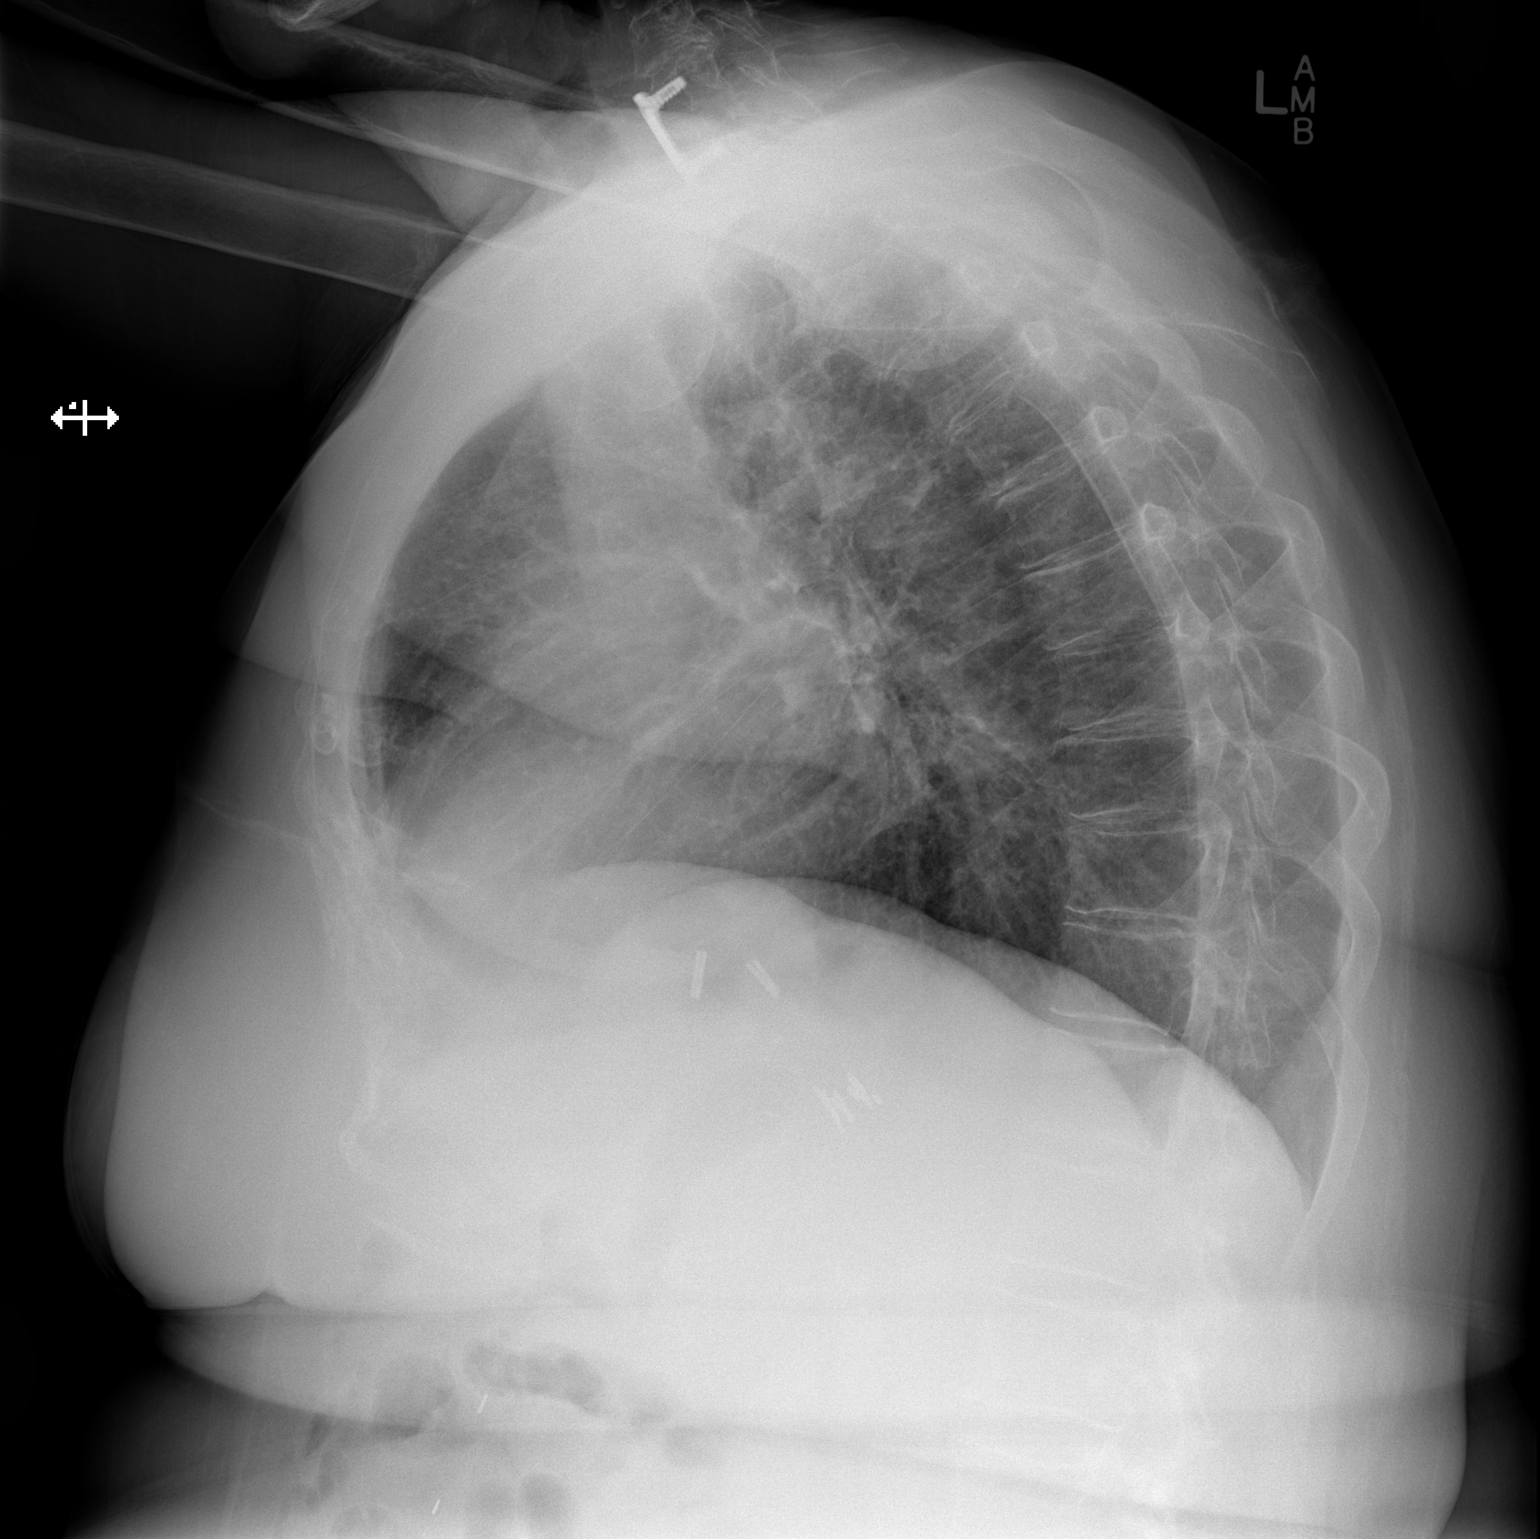

[2 of 2 positions shown; findings below may reference images not displayed]

FINDINGS: Cardiac shadow is stable.  The lungs are clear
bilaterally.  Postsurgical changes are again noted.  No acute bony
abnormality is seen.
IMPRESSION: No acute abnormality noted.

## 2014-12-01 ENCOUNTER — Ambulatory Visit: Payer: Medicare Other | Admitting: Neurology

## 2014-12-01 ENCOUNTER — Encounter: Payer: Self-pay | Admitting: Neurology

## 2014-12-01 ENCOUNTER — Ambulatory Visit (INDEPENDENT_AMBULATORY_CARE_PROVIDER_SITE_OTHER): Payer: Medicare Other | Admitting: Neurology

## 2014-12-01 VITALS — BP 117/73 | HR 73 | Ht 66.5 in | Wt 174.4 lb

## 2014-12-01 DIAGNOSIS — G243 Spasmodic torticollis: Secondary | ICD-10-CM

## 2014-12-01 NOTE — Progress Notes (Signed)
[facility]    Provider:  Dr Jaynee Eagles Referring Provider: Kathyrn Drown, MD Primary Care Physician:  Sallee Lange, MD   CC: Cervical dystonia  HPI: Amanda Osborne is a 64 y.o. female here for cervical muscle pain. Patient has hadcervical neck pain since 2000 that was treated with cervical muscle trigger-point injections, pain meds(percocet and oxycodone). She can actually feel the muscles tightening in the neck and the pain going up her neck and the back of her head. She underwent anterior cervical decompression C5-C6 arthrodesis with structural allograft, anterior plate fixation G9-F6 and removal of previously placed plate at O1-H0 in September 2014. She was hoping this would help with her symptoms.   In February/March timeframe she had an episode of significantly worsened neck pain that lasted several months and in May she had an MRI of the cervical spine without acute findings. She says at that time her cervical muscles were so sore that she "looked like a gargoyle". She always has cervical muscle tenderness and decreased ROM. Her neck muscles are incredibly tight and painful. She can't move her neck as well as she would like. This is extremely painful and continuous but she has flares where the muscles tighten even more and she looks like a gargoyle because she can see the big muscle bumps on her shoulders.  This has been going on for many, many years without relief. She has tried PT, muscle relaxants, heat, massage, pain medications without relief.   History   Social History  . Marital Status: Divorced    Spouse Name: N/A    Number of Children: 1  . Years of Education: HS   Occupational History  . retired    Social History Main Topics  . Smoking status: Never Smoker   . Smokeless tobacco: Never Used  . Alcohol Use: No  . Drug Use: No  . Sexual Activity: Not on file   Other Topics Concern  . Not on file   Social History Narrative   Patient lives at  home alone.   Caffeine Use: daily (BC podwder)    Family History  Problem Relation Age of Onset  . Stroke Mother   . Heart disease Mother   . Diabetes Mother   . Heart disease Father   . Cancer - Colon Father   . Other Brother     Past Medical History  Diagnosis Date  . Wrist fracture, bilateral 2005 and 2010    right then left  . Cervical disc herniation     c5 and c6  . Narcolepsy   . Occipital neuralgia   . Iron deficiency anemia 11/30/2012  . Unspecified vitamin D deficiency 03/26/2013  . Chronic pain   . Fibromyalgia   . Complication of anesthesia   . PONV (postoperative nausea and vomiting)   . Hypertension   . Anxiety   . Headache(784.0)     Hx: of migraines until age 64's  . GERD (gastroesophageal reflux disease)   . Arthritis   . Hyperlipidemia     Past Surgical History  Procedure Laterality Date  . Anterior cervical decomp/discectomy fusion  2000  . Gastric bypass  1981  . Bone graft hip iliac crest  2006    to right wrist to correct fracture  . Fracture surgery      Hx: of right wrist surgery  . Tonsillectomy    . Colonoscopy      Hx; of  . Dilation and curettage of uterus    .  Anterior cervical decomp/discectomy fusion N/A 08/05/2013    Procedure: Cervical five-six Anterior cervical decompression/diskectomy/fusion/ Synthes plate removal;  Surgeon: Kristeen Miss, MD;  Location: [facility];  Service: Neurosurgery;  Laterality: N/A;    Current Outpatient Prescriptions  Medication Sig Dispense Refill  . acetaminophen (TYLENOL) 500 MG tablet Take 500 mg by mouth every 6 (six) hours as needed.    . ALPRAZolam (XANAX) 0.5 MG tablet Take 1 tablet (0.5 mg total) by mouth 3 (three) times daily as needed. 90 tablet 5  . Aspirin-Caffeine (BC FAST PAIN RELIEF) 845-65 MG PACK Take 1 packet by mouth daily.     . Cholecalciferol (VITAMIN D3) 10000 UNITS capsule Take 5,000 Units by mouth daily.     . Ferrous Fumarate (FERRO-SEQUELS PO) Take by mouth every other day.     . hydrochlorothiazide (HYDRODIURIL) 25 MG tablet take 1 tablet by mouth once daily 90 tablet 3  . ibuprofen (ADVIL,MOTRIN) 200 MG tablet Take 400 mg by mouth 3 (three) times daily.    Marland Kitchen lisinopril (PRINIVIL,ZESTRIL) 5 MG tablet take 1 tablet once daily 90 tablet 3  . Multiple Vitamin (MULTIVITAMIN WITH MINERALS) TABS tablet Take 1 tablet by mouth daily.    Marland Kitchen omeprazole (PRILOSEC) 20 MG capsule Take 1 capsule (20 mg total) by mouth daily. 30 capsule 5  . oxyCODONE (OXY IR/ROXICODONE) 5 MG immediate release tablet Take 1 tablet (5 mg total) by mouth every 4 (four) hours as needed for severe pain. 180 tablet 0  . polyethylene glycol powder (GLYCOLAX/MIRALAX) powder Take 17 g by mouth as needed (for constipation).      No current facility-administered medications for this visit.    Allergies as of 12/01/2014 - Review Complete 12/01/2014  Allergen Reaction Noted  . Celexa [citalopram hydrobromide] Nausea And Vomiting 05/01/2013  . Neurontin [gabapentin]  03/26/2013  . Soma [carisoprodol]  09/10/2014    Vitals: BP 117/73 mmHg  Pulse 73  Ht 5' 6.5" (1.689 m)  Wt 174 lb 6.4 oz (79.107 kg)  BMI 27.73 kg/m2 Last Weight:  Wt Readings from Last 1 Encounters:  12/01/14 174 lb 6.4 oz (79.107 kg)   Last Height:   Ht Readings from Last 1 Encounters:  12/01/14 5' 6.5" (1.689 m)   Physical exam: Exam: Gen: Moderate distress, well nourised, obese, well groomed  CV: RRR,  Eyes: Conjunctivae clear without exudates or hemorrhage HEENT: Tissue edema of the cervical muscles and anterior chest. Hoarseness of the voice leads me to suspect she may have edema and swelling around esophagus and recurrent laryngeal nerve or laryngeal edema and swelling MSK: Decreased ROM neck, horizontal turning of the head (torticollis), dystonic tremor of the head, severely dystonic cervical muscles in particular the upper trapezius   Neuro: Detailed Neurologic Exam  Speech:  Speech is  fluent and spontaneous with normal comprehension.  Cognition:  The patient is oriented to person, place, and time;   recent and remote memory intact;   language fluent;   normal attention, concentration,   fund of knowledge Cranial Nerves:  The pupils are equal, round, and reactive to light. Visual fields are full to finger confrontation. Extraocular movements are intact. The face is symmetric. The palate elevates in the midline. Voice is hoarse. Shoulder shrug is normal. The tongue has normal motion without fasciculations.   Coordination:  No dysmetria noted  Gait:  Normal native gait  Motor Observation:  horizontal turning of the head left (torticollis ) and anterocollis, dystonic tremor of the head, severely dystonic cervical muscles  in particular the upper trapezius where both shoulders are elevated left>right  Tone:  Normal muscle tone.   Posture:  Posture is normal.    Strength:  Strength is intact in the upper and lower limbs.    Sensation: intact to LT         Assessment/Plan:  64 year old female with cervical dystonia here for Electric stimulation guided Botulinum toxin injection.  Botox 200 units (100 units/2cc NS). Wasted 65 units.  j code botox type A [id number] C3909 c3 Expiration date 09/2017  Levator Scapulae  Left 25 units Right 20 units  Sternocleidomastoid Left 15 units Right 25 units  Trapezius Left 30 units Right 20 units   Sarina Ill, MD  [facility] [address] [place], [place] [address]  Phone [phone] Fax [phone]

## 2014-12-08 ENCOUNTER — Ambulatory Visit: Payer: Medicare Other | Admitting: Family Medicine

## 2014-12-22 ENCOUNTER — Ambulatory Visit (INDEPENDENT_AMBULATORY_CARE_PROVIDER_SITE_OTHER): Payer: Medicare Other | Admitting: Nurse Practitioner

## 2014-12-22 ENCOUNTER — Encounter: Payer: Self-pay | Admitting: Nurse Practitioner

## 2014-12-22 VITALS — BP 110/66 | Temp 97.9°F | Ht 66.5 in | Wt 177.0 lb

## 2014-12-22 DIAGNOSIS — L089 Local infection of the skin and subcutaneous tissue, unspecified: Secondary | ICD-10-CM | POA: Diagnosis not present

## 2014-12-22 DIAGNOSIS — M810 Age-related osteoporosis without current pathological fracture: Secondary | ICD-10-CM | POA: Diagnosis not present

## 2014-12-22 DIAGNOSIS — S00502A Unspecified superficial injury of oral cavity, initial encounter: Secondary | ICD-10-CM

## 2014-12-22 DIAGNOSIS — K0510 Chronic gingivitis, plaque induced: Secondary | ICD-10-CM

## 2014-12-22 MED ORDER — PENICILLIN V POTASSIUM 500 MG PO TABS: 500.0000 mg | ORAL_TABLET | Freq: Three times a day (TID) | ORAL | Status: DC

## 2014-12-22 NOTE — Progress Notes (Signed)
Subjective:  Presents complaints of possible gum infection on the right upper side for the past 2 and half weeks. No fever. No TMJ symptoms. Better with salt water and peroxide rinses. Still has some tenderness. Began after eating some Pita Chips. Thinks some material got stuck under her bridge. Has had extensive dental work. Does not take any medications for osteoporosis. On daily vitamin D therapy for severe deficiency.  Objective:   BP 110/66 mmHg  Temp(Src) 97.9 F (36.6 C) (Oral)  Ht 5' 6.5" (1.689 m)  Wt 177 lb (80.287 kg)  BMI 28.14 kg/m2 NAD. Alert, oriented. Lungs clear. Heart regular rate rhythm. No significant erythema or edema noted of the right upper gingiva, mild tenderness to palpation. See bone density June 2014.  Assessment: Superficial injury of gum with infection, initial encounter  Osteoporosis   Plan:  Meds ordered this encounter  Medications  . penicillin v potassium (VEETID) 500 MG tablet    Sig: Take 1 tablet (500 mg total) by mouth 3 (three) times daily.    Dispense:  21 tablet    Refill:  0    Order Specific Question:  Supervising Provider    Answer:  Mikey Kirschner [[id number]]   Continue half-strength peroxide rinse or saline rinse. Recommend dental visit if symptoms persist. Recommend repeat bone density scan early July.

## 2014-12-22 NOTE — Patient Instructions (Signed)
Need to get bone density early July

## 2014-12-29 ENCOUNTER — Ambulatory Visit: Payer: Medicare Other | Admitting: Neurology

## 2014-12-30 ENCOUNTER — Telehealth: Payer: Self-pay | Admitting: *Deleted

## 2014-12-30 ENCOUNTER — Other Ambulatory Visit: Payer: Self-pay | Admitting: Neurology

## 2014-12-30 MED ORDER — PREDNISONE 10 MG PO TABS: 10.0000 mg | ORAL_TABLET | Freq: Every day | ORAL | Status: DC

## 2014-12-30 NOTE — Telephone Encounter (Signed)
Patient calling wanting to know if Dr. Jaynee Eagles can call her in a Steroid to her pharmacy. Patient wants the doctor to call it in and not sent electronic. Please advise.

## 2014-12-30 NOTE — Telephone Encounter (Signed)
Talked with patient and told her Dr. Jaynee Eagles faxed over the prescription for prednisone 10mg  at her [facility] in [place]. Dr. Jaynee Eagles also called the pharmacy and they verbalized they received the fax.  Patient verbalized understanding.

## 2014-12-30 NOTE — Telephone Encounter (Signed)
Can you call patient and let her know that I sent it in electronically but i will also fax it. Does she specifically need [place] to call the pharmacy?

## 2015-01-02 ENCOUNTER — Ambulatory Visit (INDEPENDENT_AMBULATORY_CARE_PROVIDER_SITE_OTHER): Payer: Medicare Other | Admitting: Nurse Practitioner

## 2015-01-02 ENCOUNTER — Encounter: Payer: Self-pay | Admitting: Nurse Practitioner

## 2015-01-02 VITALS — BP 110/80 | Temp 97.8°F | Ht 66.5 in | Wt 173.2 lb

## 2015-01-02 DIAGNOSIS — M2669 Other specified disorders of temporomandibular joint: Secondary | ICD-10-CM

## 2015-01-02 DIAGNOSIS — J329 Chronic sinusitis, unspecified: Secondary | ICD-10-CM

## 2015-01-04 ENCOUNTER — Encounter: Payer: Self-pay | Admitting: Nurse Practitioner

## 2015-01-04 NOTE — Progress Notes (Signed)
Subjective:  Presents for c/o pain, mainly pressure along the right maxillary area. Was having pain radiating into teeth area. Completed antibiotic; see note 2/1. Also started on Prednisone for neck pain on 2/9. No further gum or mouth pain. Also pain in the TMJ area. No specific pain with chewing. Has had extensive dental work; mainly worried about this. No fever. Minimal cough. Ear pressure.  Objective:   BP 110/80 mmHg  Temp(Src) 97.8 F (36.6 C) (Oral)  Ht 5' 6.5" (1.689 m)  Wt 173 lb 4 oz (78.586 kg)  BMI 27.55 kg/m2 NAD. Alert, oriented. TMs clear fluid, more on the right. Pharynx injected with cloudy PND. Neck supple with mild anterior adenopathy. Lungs clear. Heart RRR. Tenderness over right maxillary area sinus with percussion. Tenderness with palpation right TMJ area. No popping or crepitus with opening and closing of the mouth.   Assessment: Chronic sinusitis, unspecified location - Plan: CT Maxillofacial WO CM  TMJ inflammation  Plan: CT scan to assess for possible maxillary sinusitis. Discussed measures to help with TMJ. Further follow up based on CT results.

## 2015-01-07 ENCOUNTER — Ambulatory Visit (HOSPITAL_COMMUNITY): Payer: Medicare Other

## 2015-01-15 ENCOUNTER — Ambulatory Visit (HOSPITAL_COMMUNITY)
Admission: RE | Admit: 2015-01-15 | Discharge: 2015-01-15 | Disposition: A | Discharge status: Home / Self Care | Payer: Medicare Other | Source: Ambulatory Visit | Attending: Nurse Practitioner | Admitting: Nurse Practitioner

## 2015-01-15 DIAGNOSIS — J329 Chronic sinusitis, unspecified: Secondary | ICD-10-CM | POA: Insufficient documentation

## 2015-01-15 DIAGNOSIS — R51 Headache: Secondary | ICD-10-CM | POA: Insufficient documentation

## 2015-01-16 ENCOUNTER — Telehealth: Payer: Self-pay | Admitting: Nurse Practitioner

## 2015-01-16 NOTE — Telephone Encounter (Signed)
I deleted CT scan from inbox before commenting. CT scan did not show any abnormalities explaining her pain. Sinuses were clear. Does she want to see maxillofacial/oral surgeon for evaluation?

## 2015-01-16 NOTE — Telephone Encounter (Signed)
Pt notified and verbalized understanding. She does not want referral for maxillofacial/oral surgeon. She no longer has problems with her sinus/jaw.

## 2015-01-16 NOTE — Telephone Encounter (Signed)
Noted  

## 2015-01-19 ENCOUNTER — Other Ambulatory Visit (HOSPITAL_COMMUNITY): Payer: Medicare Other

## 2015-01-22 ENCOUNTER — Other Ambulatory Visit: Payer: Self-pay | Admitting: Family Medicine

## 2015-02-06 ENCOUNTER — Encounter: Payer: Self-pay | Admitting: Family Medicine

## 2015-02-06 ENCOUNTER — Ambulatory Visit (INDEPENDENT_AMBULATORY_CARE_PROVIDER_SITE_OTHER): Payer: Medicare Other | Admitting: Family Medicine

## 2015-02-06 VITALS — BP 120/82 | Ht 65.5 in | Wt 173.8 lb

## 2015-02-06 DIAGNOSIS — G8929 Other chronic pain: Secondary | ICD-10-CM

## 2015-02-06 DIAGNOSIS — G243 Spasmodic torticollis: Secondary | ICD-10-CM | POA: Diagnosis not present

## 2015-02-06 DIAGNOSIS — E785 Hyperlipidemia, unspecified: Secondary | ICD-10-CM | POA: Diagnosis not present

## 2015-02-06 DIAGNOSIS — I10 Essential (primary) hypertension: Secondary | ICD-10-CM | POA: Diagnosis not present

## 2015-02-06 DIAGNOSIS — D509 Iron deficiency anemia, unspecified: Secondary | ICD-10-CM

## 2015-02-06 DIAGNOSIS — M79641 Pain in right hand: Secondary | ICD-10-CM

## 2015-02-06 DIAGNOSIS — M549 Dorsalgia, unspecified: Secondary | ICD-10-CM

## 2015-02-06 MED ORDER — OXYCODONE HCL 5 MG PO TABS: 5.0000 mg | ORAL_TABLET | ORAL | Status: DC | PRN

## 2015-02-06 NOTE — Patient Instructions (Signed)
Dear Patient,  It has been recommended to you that you have a colonoscopy. It is your responsibility to carry through with this recommendation.   Did you realize that colon cancer is the second leading cancer killer in the [place]. One in every 20 adults will get colon cancer. If all adults would go through the recommended screening for colon cancer (getting a colonoscopy), then there would be a 60% reduction in the number of people dying from colon cancer.  Colon cancer just doesn't come out of the blue. It starts off as a small polyp which over time grows into a cancer. A colonoscopy can prevent cancer and in many cases detected when it is at a very treatable phase. Small colon cancers can have cure rates of 95%. Advanced colon cancer, which often occurs in people who do not do their screenings, have cure rates less than 20%. The risk of colon cancer advances with age. Most adults should have regular colonoscopies every 10 years starting at age 50. This recommendation can vary depending on a person's medical history.  Health-care laws now allow for you to call the gastroenterologist office directly in order to set yourself up for this very important tests. Today we have recommended to you that you do this test. This test may save your life. Failure to do this test puts you at risk for premature death from colon cancer. Do the right thing and schedule this test now.  Here as a list of specialists we recommend in the surrounding area. When you call their office let them know that you are a patient of our practice in your interested in doing a screening colonoscopy. They should assist you without problems. You will need the following information when you called them: 1-name of which Dr. you see, 2-your insurance information, 3-a list of medications that you currently take, 4-any allergies you have to medications.  [facility] Dr. Mike Rourk, Dr Sandi Fields   [place]  [facility]ologist   [phone]  Dr.Najeeb [name] [facility]   [phone]  [facility] (Dr. Perry, N, Stark, Brodie, Gesner, Jacobs and Pyrtle) [phone]  [facility] (Dr. Buscemi, Edwards, Hayes, Maygod,Outlaw,Schooler) [phone]  Each group of specialists has assured us that when you called them they will help you get your colonoscopy set up. Should you have problems please let us know. Be sure to call soon. Sincerely, Carolyn Hoskins, Dr Steve Luking, Dr.Scott Luking    

## 2015-02-06 NOTE — Progress Notes (Signed)
   Subjective:    Patient ID: Amanda Osborne, female    DOB: Jun 06, 1951, 64 y.o.   MRN: [id number]  HPI  This patient was seen today for chronic pain  The medication list was reviewed and updated.   -Compliance with pain medication: yes  The patient was advised the importance of maintaining medication and not using illegal substances with these.  Refills needed: yes The patient was educated that we can provide 3 monthly scripts for their medication, it is their responsibility to follow the instructions.  Side effects or complications from medications: none  Patient is aware that pain medications are meant to minimize the severity of the pain to allow their pain levels to improve to allow for better function. They are aware of that pain medications cannot totally remove their pain.  Due for UDT ( at least once per year) : next visit     Patient would like to discuss Botox Therapy for neck and shoulders-had injection in Jan.   Patient also having problem with right hand-cramping or pulling for over a year.     Review of Systems  Constitutional: Negative for activity change, appetite change and fatigue.  HENT: Negative for congestion.   Respiratory: Negative for cough.   Cardiovascular: Negative for chest pain.  Gastrointestinal: Negative for abdominal pain.  Endocrine: Negative for polydipsia and polyphagia.  Neurological: Negative for weakness.  Psychiatric/Behavioral: Negative for confusion.       Objective:   Physical Exam  Constitutional: She appears well-nourished. No distress.  Cardiovascular: Normal rate, regular rhythm and normal heart sounds.   No murmur heard. Pulmonary/Chest: Effort normal and breath sounds normal. No respiratory distress.  Musculoskeletal: She exhibits no edema.  Lymphadenopathy:    She has no cervical adenopathy.  Neurological: She is alert. She exhibits normal muscle tone.  Psychiatric: Her behavior is normal.  Vitals  reviewed.         Assessment & Plan:  Pain management. 3 prescriptions given To follow-up in 3 months She denies abusing the medications She uses it for chronic neck pain and also chronic low back pain. She denies abusing it does not cause drowsiness she states Patient with a lot of neck spasms greater than half the time spent with her today was discussing neck spasms. She tried Botox injections that helped a little bit. She states she might be getting more. She has a history of iron deficient anemia we will be checking lab work she relates a lot of fatigue and tiredness Hyperlipidemia lab work ordered. Await results Right hand pain and discomfort with difficulty in flexing the index finger extensively. Referral to orthopedics. Greater than half amount of time spent in discussion 25 minutes spent with patient

## 2015-04-03 ENCOUNTER — Other Ambulatory Visit: Payer: Self-pay | Admitting: Family Medicine

## 2015-04-03 NOTE — Telephone Encounter (Signed)
Ok 6 mo worht

## 2015-04-29 DIAGNOSIS — E785 Hyperlipidemia, unspecified: Secondary | ICD-10-CM | POA: Diagnosis not present

## 2015-04-29 DIAGNOSIS — D509 Iron deficiency anemia, unspecified: Secondary | ICD-10-CM | POA: Diagnosis not present

## 2015-04-30 LAB — CBC WITH DIFFERENTIAL/PLATELET
Basophils Absolute: 0 10*3/uL (ref 0.0–0.2)
Basos: 1 %
EOS (ABSOLUTE): 0.4 10*3/uL (ref 0.0–0.4)
EOS: 10 %
HEMOGLOBIN: 13.1 g/dL (ref 11.1–15.9)
Hematocrit: 39.4 % (ref 34.0–46.6)
Immature Grans (Abs): 0 10*3/uL (ref 0.0–0.1)
Immature Granulocytes: 0 %
LYMPHS ABS: 1.1 10*3/uL (ref 0.7–3.1)
Lymphs: 29 %
MCH: 29 pg (ref 26.6–33.0)
MCHC: 33.2 g/dL (ref 31.5–35.7)
MCV: 87 fL (ref 79–97)
Monocytes Absolute: 0.4 10*3/uL (ref 0.1–0.9)
Monocytes: 10 %
NEUTROS ABS: 1.9 10*3/uL (ref 1.4–7.0)
Neutrophils: 50 %
Platelets: 321 10*3/uL (ref 150–379)
RBC: 4.52 x10E6/uL (ref 3.77–5.28)
RDW: 14 % (ref 12.3–15.4)
WBC: 3.8 10*3/uL (ref 3.4–10.8)

## 2015-04-30 LAB — LIPID PANEL
CHOL/HDL RATIO: 4.7 ratio — AB (ref 0.0–4.4)
Cholesterol, Total: 215 mg/dL — ABNORMAL HIGH (ref 100–199)
HDL: 46 mg/dL (ref >39)
LDL Calculated: 148 mg/dL — ABNORMAL HIGH (ref 0–99)
Triglycerides: 106 mg/dL (ref 0–149)
VLDL CHOLESTEROL CAL: 21 mg/dL (ref 5–40)

## 2015-04-30 LAB — FERRITIN: FERRITIN: 61 ng/mL (ref 15–150)

## 2015-05-01 ENCOUNTER — Ambulatory Visit (INDEPENDENT_AMBULATORY_CARE_PROVIDER_SITE_OTHER): Payer: Medicare Other | Admitting: Family Medicine

## 2015-05-01 ENCOUNTER — Encounter: Payer: Self-pay | Admitting: Family Medicine

## 2015-05-01 VITALS — BP 110/74 | Ht 65.5 in | Wt 174.1 lb

## 2015-05-01 DIAGNOSIS — G8929 Other chronic pain: Secondary | ICD-10-CM | POA: Diagnosis not present

## 2015-05-01 DIAGNOSIS — M549 Dorsalgia, unspecified: Secondary | ICD-10-CM

## 2015-05-01 DIAGNOSIS — K219 Gastro-esophageal reflux disease without esophagitis: Secondary | ICD-10-CM | POA: Diagnosis not present

## 2015-05-01 DIAGNOSIS — K921 Melena: Secondary | ICD-10-CM | POA: Diagnosis not present

## 2015-05-01 DIAGNOSIS — I10 Essential (primary) hypertension: Secondary | ICD-10-CM

## 2015-05-01 DIAGNOSIS — Z79891 Long term (current) use of opiate analgesic: Secondary | ICD-10-CM | POA: Diagnosis not present

## 2015-05-01 DIAGNOSIS — R739 Hyperglycemia, unspecified: Secondary | ICD-10-CM | POA: Diagnosis not present

## 2015-05-01 DIAGNOSIS — Z7189 Other specified counseling: Secondary | ICD-10-CM

## 2015-05-01 DIAGNOSIS — Z1211 Encounter for screening for malignant neoplasm of colon: Secondary | ICD-10-CM | POA: Diagnosis not present

## 2015-05-01 LAB — POCT GLYCOSYLATED HEMOGLOBIN (HGB A1C): HEMOGLOBIN A1C: 4.4

## 2015-05-01 MED ORDER — FAMOTIDINE 20 MG PO TABS: 20.0000 mg | ORAL_TABLET | Freq: Every day | ORAL | Status: DC

## 2015-05-01 MED ORDER — OXYCODONE HCL 5 MG PO TABS: 5.0000 mg | ORAL_TABLET | ORAL | Status: DC | PRN

## 2015-05-01 MED ORDER — FUROSEMIDE 20 MG PO TABS: ORAL_TABLET | ORAL | Status: DC

## 2015-05-01 NOTE — Progress Notes (Signed)
   Subjective:    Patient ID: Amanda Osborne, female    DOB: 1951/04/16, 64 y.o.   MRN: [id number]  HPI This patient was seen today for chronic pain  The medication list was reviewed and updated.   -Compliance with pain medication: yes  The patient was advised the importance of maintaining medication and not using illegal substances with these.  Refills needed: yes  The patient was educated that we can provide 3 monthly scripts for their medication, it is their responsibility to follow the instructions.  Side effects or complications from medications: none  Patient is aware that pain medications are meant to minimize the severity of the pain to allow their pain levels to improve to allow for better function. They are aware of that pain medications cannot totally remove their pain.  Due for UDT ( at least once per year) : yes  Patient states that her blood pressure dropped very low while working in the garden about 1 month ago.  Patient states that she is having swelling in her ankles that she would like to talk to the doctor about this. Onset 1 week ago. She denies PND orthopnea or chest pressure.  She states her reflux is been under good control she is using Pepcid currently for reflux and it seems to be helping she does not when he uses a PPI.   Patient has history of hypertension but recently she is noted that her blood pressures been lower so she cut her lisinopril down to half tablet day.  Review of Systems    she denies chest tightness pressure pain shortness breath nausea vomiting diarrhea she does relate some swelling in the ankles intermittent Objective:   Physical Exam Lungs are clear hearts regular pulse normal subjective discomfort in her neck negative straight leg raise pulse normal BP good       Assessment & Plan:  hyperlipidemia-patient does not tolerate statins. Back cholesterol is elevated L3 diet recommended  Chronic pain patient denies abusing her pain  medicines we discussed pain medicine contract and discuss importance of taking the medicine as prescribed.  Patient was cautioned that using nerve pills pain medicines and muscle relaxants at the same time greatly increases risk of accidental death patient was cautioned not to use muscle relaxer much at all she was also cautioned that nerve medication should not be used at bedtime only during the day as necessary for anxiety  History of hyperglycemia A1c looks great no sign of diabetes watch diet stay physically active  Recent hypotension probably related to being outside and he she cut down on her blood pressure medicine which I agree on. Stop hydrochlorothiazide. May use furosemide half or a whole tablet in the morning when necessary for swelling in the ankles no need for additional lab work currently

## 2015-05-09 LAB — TOXASSURE SELECT 13 (MW), URINE: PDF: 0

## 2015-05-11 ENCOUNTER — Ambulatory Visit: Payer: Medicare Other | Admitting: Family Medicine

## 2015-05-12 ENCOUNTER — Other Ambulatory Visit: Payer: Self-pay

## 2015-05-12 ENCOUNTER — Other Ambulatory Visit: Payer: Self-pay | Admitting: Family Medicine

## 2015-05-12 DIAGNOSIS — Z79899 Other long term (current) drug therapy: Secondary | ICD-10-CM

## 2015-05-12 DIAGNOSIS — Z13220 Encounter for screening for lipoid disorders: Secondary | ICD-10-CM

## 2015-05-12 MED ORDER — PRAVASTATIN SODIUM 20 MG PO TABS: ORAL_TABLET | ORAL | Status: DC

## 2015-07-02 ENCOUNTER — Other Ambulatory Visit: Payer: Self-pay | Admitting: Family Medicine

## 2015-07-14 ENCOUNTER — Ambulatory Visit: Payer: Medicare Other | Admitting: Nurse Practitioner

## 2015-07-20 ENCOUNTER — Encounter: Payer: Self-pay | Admitting: Family Medicine

## 2015-07-20 ENCOUNTER — Ambulatory Visit (INDEPENDENT_AMBULATORY_CARE_PROVIDER_SITE_OTHER): Payer: Medicare Other | Admitting: Family Medicine

## 2015-07-20 VITALS — BP 126/70 | Ht 65.5 in | Wt 166.1 lb

## 2015-07-20 DIAGNOSIS — G8929 Other chronic pain: Secondary | ICD-10-CM

## 2015-07-20 DIAGNOSIS — M549 Dorsalgia, unspecified: Secondary | ICD-10-CM

## 2015-07-20 DIAGNOSIS — I10 Essential (primary) hypertension: Secondary | ICD-10-CM | POA: Diagnosis not present

## 2015-07-20 MED ORDER — ALPRAZOLAM 0.5 MG PO TABS: ORAL_TABLET | ORAL | Status: DC

## 2015-07-20 MED ORDER — LISINOPRIL 5 MG PO TABS: ORAL_TABLET | ORAL | Status: DC

## 2015-07-20 MED ORDER — OXYCODONE HCL 5 MG PO TABS: 5.0000 mg | ORAL_TABLET | ORAL | Status: DC | PRN

## 2015-07-20 MED ORDER — HYDROCHLOROTHIAZIDE 25 MG PO TABS: 25.0000 mg | ORAL_TABLET | Freq: Every day | ORAL | Status: DC

## 2015-07-20 MED ORDER — FAMOTIDINE 20 MG PO TABS: ORAL_TABLET | ORAL | Status: DC

## 2015-07-20 MED ORDER — FLUTICASONE PROPIONATE 50 MCG/ACT NA SUSP: 2.0000 | Freq: Every day | NASAL | Status: DC

## 2015-07-20 NOTE — Progress Notes (Signed)
   Subjective:    Patient ID: Amanda Osborne, female    DOB: 12-09-1950, 64 y.o.   MRN: [id number]  Sinusitis This is a new problem. The current episode started more than 1 month ago. The problem is unchanged. Associated symptoms include congestion, coughing and sinus pressure. Pertinent negatives include no ear pain or shortness of breath. (Sinus drainage) Past treatments include spray decongestants and oral decongestants.   This patient was seen today for chronic pain  The medication list was reviewed and updated.   -Compliance with pain medication: good  The patient was advised the importance of maintaining medication and not using illegal substances with these.  Refills needed: yes  The patient was educated that we can provide 3 monthly scripts for their medication, it is their responsibility to follow the instructions.  Side effects or complications from medications: none  Patient is aware that pain medications are meant to minimize the severity of the pain to allow their pain levels to improve to allow for better function. They are aware of that pain medications cannot totally remove their pain.  Due for UDT ( at least once per year) : June 10,2016   Patient states that she also needs for evaluation for medication and food stamps.   Complains of fatigue, denies being sleepy, feels physically tired,tries to stay active in the gardenening  Trying to eat fair, small amounts frequently BM move fair  Takes 1/2 of Lisinopril daily Stopped lasix and changed to HCTZ which is working fine   Review of Systems  Constitutional: Negative for fever and activity change.  HENT: Positive for congestion, rhinorrhea and sinus pressure. Negative for ear pain.   Eyes: Negative for discharge.  Respiratory: Positive for cough. Negative for shortness of breath and wheezing.   Cardiovascular: Negative for chest pain.       Objective:   Physical Exam  Constitutional: She appears  well-nourished. No distress.  Cardiovascular: Normal rate, regular rhythm and normal heart sounds.   No murmur heard. Pulmonary/Chest: Effort normal and breath sounds normal. No respiratory distress.  Musculoskeletal: She exhibits no edema.  Lymphadenopathy:    She has no cervical adenopathy.  Neurological: She is alert. She exhibits normal muscle tone.  Psychiatric: Her behavior is normal.  Vitals reviewed.         Assessment & Plan:  Sinusitis antibiotics prescribed warning signs discussed Chronic pain-prescriptions given Chronic anxiety issues patient was told that long-term we need to continue to reduce the amount of Xanax. She is not to use this at bedtime. The goal is to get down to 0.5 mg twice a day. I do not believe the patient will be able to get down to none 25 minutes spent with patient greater than half was spent discussing and answering multiple questions  The patient has requested a letter regarding her healthcare expenditures The patient is also interested in knee were testing for colon cancer/colonguard we will look into this. According to the website they do not recommend this testing as a way of looking for colon cancer in a person who has a family history of colon cancer.

## 2015-07-21 LAB — POC HEMOCCULT BLD/STL (HOME/3-CARD/SCREEN)
Card #2 Fecal Occult Blod, POC: POSITIVE
Card #3 Fecal Occult Blood, POC: POSITIVE
Fecal Occult Blood, POC: POSITIVE — AB

## 2015-07-21 NOTE — Addendum Note (Signed)
Addended by: Dairl Ponder on: 07/21/2015 05:01 PM   Modules accepted: Orders

## 2015-07-22 NOTE — Addendum Note (Signed)
Addended by: Launa Grill on: 07/22/2015 08:33 AM   Modules accepted: Orders

## 2015-07-23 ENCOUNTER — Encounter (INDEPENDENT_AMBULATORY_CARE_PROVIDER_SITE_OTHER): Payer: Self-pay | Admitting: *Deleted

## 2015-08-13 ENCOUNTER — Other Ambulatory Visit: Payer: Self-pay | Admitting: Family Medicine

## 2015-08-13 DIAGNOSIS — Z1231 Encounter for screening mammogram for malignant neoplasm of breast: Secondary | ICD-10-CM

## 2015-08-19 ENCOUNTER — Ambulatory Visit (INDEPENDENT_AMBULATORY_CARE_PROVIDER_SITE_OTHER): Payer: Medicare Other | Admitting: Internal Medicine

## 2015-08-19 ENCOUNTER — Encounter (INDEPENDENT_AMBULATORY_CARE_PROVIDER_SITE_OTHER): Payer: Self-pay | Admitting: Internal Medicine

## 2015-08-19 ENCOUNTER — Other Ambulatory Visit (INDEPENDENT_AMBULATORY_CARE_PROVIDER_SITE_OTHER): Payer: Self-pay | Admitting: Internal Medicine

## 2015-08-19 VITALS — BP 90/62 | HR 60 | Temp 98.0°F | Ht 66.5 in | Wt 170.0 lb

## 2015-08-19 DIAGNOSIS — R195 Other fecal abnormalities: Secondary | ICD-10-CM

## 2015-08-19 DIAGNOSIS — K219 Gastro-esophageal reflux disease without esophagitis: Secondary | ICD-10-CM

## 2015-08-19 NOTE — Progress Notes (Signed)
Subjective:    Patient ID: Amanda Osborne, female    DOB: Mar 28, 1951, 64 y.o.   MRN: [id number]  HPI  Referred by Dr. Sallee Lange for positive stool card on 07/21/2015. Patient denies seeing any blood. Appetite is not good. She says she is having issues with her stomach. She says she has a lot of acid reflux. If she eats something solid she will have pain. She says she has hx of stomach ulcer. She takes Pepcid BID for her GERD. She has had the pain for about 6 weeks. She takes BC powder x 1 a day. She takes Motrin 200mg  (2 tabs) three times a day.  Gastric bypass in 1981 for weight loss.  She chews her food well. She can feel her food move her esophagus.    CBC    Component Value Date/Time   WBC 3.8 04/29/2015 0939   WBC 5.6 09/17/2014 1528   RBC 4.52 04/29/2015 0939   RBC 4.72 09/17/2014 1528   RBC 5.02 11/30/2012 1540   HGB 14.1 09/17/2014 1528   HCT 39.4 04/29/2015 0939   HCT 41.7 09/17/2014 1528   PLT 332 09/17/2014 1528   MCV 88.3 09/17/2014 1528   MCH 29.0 04/29/2015 0939   MCH 29.9 09/17/2014 1528   MCHC 33.2 04/29/2015 0939   MCHC 33.8 09/17/2014 1528   RDW 14.0 04/29/2015 0939   RDW 13.6 09/17/2014 1528   LYMPHSABS 1.1 04/29/2015 0939   LYMPHSABS 1.1 09/17/2014 1528   MONOABS 0.4 09/17/2014 1528   EOSABS 0.3 09/17/2014 1528   BASOSABS 0.0 04/29/2015 0939   BASOSABS 0.0 09/17/2014 1528          06/12/2008 EGD Dr. Gala Romney INDICATIONS FOR PROCEDURE: The patient is a 64 year old lady with a recent hematemesis, status post Roux-en-Y bariatric surgery back in the 80s. She has dropped her hemoglobin into the 9 range, but remains hemodynamically stable. She has had a colonoscopy 2 months ago by Dr. Earlean Shawl done in [place]. She takes multiple NSAIDs. EGD is now being done. Risks, benefits, alternatives, and limitations have been reviewed and questions answered. Please see the documentation in the  medical record.  IMPRESSION: 1. Two areas of ulceration, mucosal disruption of the esophagogastric  junction overlying a noncritical peptic stricture likely consistent  with Mallory-Weiss tear in the background setting of  gastroesophageal reflux disease likely the primary cause of  bleeding and anastomotic ulcer without bleeding stigmata. 2. Patent normal-appearing efferent limb. 3. Marked esophageal plaques suspicious for Candida esophagitis status  post biopsy brushing.   Review of Systems Past Medical History  Diagnosis Date  . Wrist fracture, bilateral 2005 and 2010    right then left  . Cervical disc herniation     c5 and c6  . Narcolepsy   . Occipital neuralgia   . Iron deficiency anemia 11/30/2012  . Unspecified vitamin D deficiency 03/26/2013  . Chronic pain   . Fibromyalgia   . Complication of anesthesia   . PONV (postoperative nausea and vomiting)   . Hypertension   . Anxiety   . Headache(784.0)     Hx: of migraines until age 62's  . GERD (gastroesophageal reflux disease)   . Arthritis   . Hyperlipidemia     Past Surgical History  Procedure Laterality Date  . Anterior cervical decomp/discectomy fusion  2000  . Gastric bypass  1981  . Bone graft hip iliac crest  2006    to right wrist to correct fracture  . Fracture surgery  Hx: of right wrist surgery  . Tonsillectomy    . Colonoscopy      Hx; of  . Dilation and curettage of uterus    . Anterior cervical decomp/discectomy fusion N/A 08/05/2013    Procedure: Cervical five-six Anterior cervical decompression/diskectomy/fusion/ Synthes plate removal;  Surgeon: Kristeen Miss, MD;  Location: [facility];  Service: Neurosurgery;  Laterality: N/A;    Allergies  Allergen Reactions  . Celexa [Citalopram Hydrobromide] Nausea And Vomiting  . Neurontin [Gabapentin]     Drowsy   . Soma [Carisoprodol]     dizzy  . Statins Other (See Comments)    MYALGIAS    Current Outpatient  Prescriptions on File Prior to Visit  Medication Sig Dispense Refill  . ALPRAZolam (XANAX) 0.5 MG tablet TAKE ONE TABLET TWO TIMES DAILY AS NEEDED 60 tablet 5  . Aspirin-Caffeine (BC FAST PAIN RELIEF) 845-65 MG PACK Take 1 packet by mouth daily.     . Cholecalciferol (VITAMIN D3) 10000 UNITS capsule Take 5,000 Units by mouth daily.     . famotidine (PEPCID) 20 MG tablet Take 1 tablet twice a day. 60 tablet 12  . Ferrous Fumarate (FERRO-SEQUELS PO) Take by mouth as needed.     . fluticasone (FLONASE) 50 MCG/ACT nasal spray Place 2 sprays into both nostrils daily. 16 g 5  . hydrochlorothiazide (HYDRODIURIL) 25 MG tablet Take 1 tablet (25 mg total) by mouth daily. 90 tablet 3  . ibuprofen (ADVIL,MOTRIN) 200 MG tablet Take 400 mg by mouth 3 (three) times daily.    Marland Kitchen lisinopril (PRINIVIL,ZESTRIL) 5 MG tablet Take 1/2 tablet daily 90 tablet 1  . loratadine (CLARITIN) 10 MG tablet Take 10 mg by mouth daily.    . Multiple Vitamin (MULTIVITAMIN WITH MINERALS) TABS tablet Take 1 tablet by mouth daily.    Marland Kitchen oxyCODONE (OXY IR/ROXICODONE) 5 MG immediate release tablet Take 1 tablet (5 mg total) by mouth every 4 (four) hours as needed for severe pain. 180 tablet 0  . polyethylene glycol powder (GLYCOLAX/MIRALAX) powder Take 17 g by mouth as needed (for constipation).     . pravastatin (PRAVACHOL) 20 MG tablet Take 1 tablet by mouth Monday, Wednesday, Friday. 12 tablet 12  . tiZANidine (ZANAFLEX) 4 MG tablet take 1 tablet by mouth every 8 hours if needed for muscle spasm 30 tablet 1   No current facility-administered medications on file prior to visit.        Objective:   Physical ExamBlood pressure 90/62, pulse 60, temperature 98 F (36.7 C), height 5' 6.5" (1.689 m), weight 170 lb (77.111 kg). Alert and oriented. Skin warm and dry. Oral mucosa is moist.   . Sclera anicteric, conjunctivae is pink. Thyroid not enlarged. No cervical lymphadenopathy. Lungs clear. Heart regular rate and rhythm.  Abdomen is  soft. Bowel sounds are positive. No hepatomegaly. No abdominal masses felt. No tenderness.  No edema to lower extremities.          Assessment & Plan:  Heme positive stool. Possible PUD. Last EGD in 2009 revealed ulcerations.  Patient has been taking an excessive amt of NSAIDs.  Patient needs an EGD. She will call our office when she can get reliable transportation (friend or who ever) to take her to have procedure.

## 2015-08-19 NOTE — Patient Instructions (Addendum)
EGD  The risks and benefits such as perforation, bleeding, and infection were reviewed with the patient and is agreeable. She will call back when she can establish transportation to have the procedure.

## 2015-08-24 ENCOUNTER — Ambulatory Visit (HOSPITAL_COMMUNITY)
Admission: RE | Admit: 2015-08-24 | Discharge: 2015-08-24 | Disposition: A | Discharge status: Home / Self Care | Payer: Medicare Other | Source: Ambulatory Visit | Attending: Family Medicine | Admitting: Family Medicine

## 2015-08-24 DIAGNOSIS — Z1231 Encounter for screening mammogram for malignant neoplasm of breast: Secondary | ICD-10-CM

## 2015-08-28 ENCOUNTER — Encounter (HOSPITAL_COMMUNITY): Payer: Self-pay | Admitting: *Deleted

## 2015-08-28 ENCOUNTER — Telehealth: Payer: Self-pay | Admitting: Family Medicine

## 2015-08-28 ENCOUNTER — Encounter (HOSPITAL_COMMUNITY)
Admission: RE | Disposition: A | Discharge status: Home / Self Care | Payer: Self-pay | Source: Ambulatory Visit | Attending: Internal Medicine

## 2015-08-28 ENCOUNTER — Ambulatory Visit (HOSPITAL_COMMUNITY)
Admission: RE | Admit: 2015-08-28 | Discharge: 2015-08-28 | Disposition: A | Discharge status: Home / Self Care | Payer: Medicare Other | Source: Ambulatory Visit | Attending: Internal Medicine | Admitting: Internal Medicine

## 2015-08-28 DIAGNOSIS — R1011 Right upper quadrant pain: Secondary | ICD-10-CM | POA: Diagnosis not present

## 2015-08-28 DIAGNOSIS — K921 Melena: Secondary | ICD-10-CM | POA: Insufficient documentation

## 2015-08-28 DIAGNOSIS — F419 Anxiety disorder, unspecified: Secondary | ICD-10-CM | POA: Insufficient documentation

## 2015-08-28 DIAGNOSIS — E785 Hyperlipidemia, unspecified: Secondary | ICD-10-CM | POA: Diagnosis not present

## 2015-08-28 DIAGNOSIS — Z9884 Bariatric surgery status: Secondary | ICD-10-CM | POA: Diagnosis not present

## 2015-08-28 DIAGNOSIS — I10 Essential (primary) hypertension: Secondary | ICD-10-CM | POA: Insufficient documentation

## 2015-08-28 DIAGNOSIS — R195 Other fecal abnormalities: Secondary | ICD-10-CM | POA: Diagnosis not present

## 2015-08-28 DIAGNOSIS — Z791 Long term (current) use of non-steroidal anti-inflammatories (NSAID): Secondary | ICD-10-CM

## 2015-08-28 DIAGNOSIS — K259 Gastric ulcer, unspecified as acute or chronic, without hemorrhage or perforation: Secondary | ICD-10-CM | POA: Diagnosis not present

## 2015-08-28 DIAGNOSIS — K2210 Ulcer of esophagus without bleeding: Secondary | ICD-10-CM | POA: Insufficient documentation

## 2015-08-28 DIAGNOSIS — K289 Gastrojejunal ulcer, unspecified as acute or chronic, without hemorrhage or perforation: Secondary | ICD-10-CM | POA: Diagnosis not present

## 2015-08-28 DIAGNOSIS — Z79899 Other long term (current) drug therapy: Secondary | ICD-10-CM | POA: Diagnosis not present

## 2015-08-28 DIAGNOSIS — Z980 Intestinal bypass and anastomosis status: Secondary | ICD-10-CM | POA: Diagnosis not present

## 2015-08-28 DIAGNOSIS — K2970 Gastritis, unspecified, without bleeding: Secondary | ICD-10-CM | POA: Insufficient documentation

## 2015-08-28 DIAGNOSIS — E559 Vitamin D deficiency, unspecified: Secondary | ICD-10-CM | POA: Diagnosis not present

## 2015-08-28 DIAGNOSIS — K633 Ulcer of intestine: Secondary | ICD-10-CM | POA: Diagnosis not present

## 2015-08-28 DIAGNOSIS — Z7951 Long term (current) use of inhaled steroids: Secondary | ICD-10-CM | POA: Insufficient documentation

## 2015-08-28 DIAGNOSIS — K315 Obstruction of duodenum: Secondary | ICD-10-CM | POA: Diagnosis not present

## 2015-08-28 DIAGNOSIS — K219 Gastro-esophageal reflux disease without esophagitis: Secondary | ICD-10-CM | POA: Insufficient documentation

## 2015-08-28 DIAGNOSIS — R1013 Epigastric pain: Secondary | ICD-10-CM

## 2015-08-28 HISTORY — PX: ESOPHAGOGASTRODUODENOSCOPY: SHX5428

## 2015-08-28 LAB — HEMOGLOBIN AND HEMATOCRIT, BLOOD
HCT: 38.1 % (ref 36.0–46.0)
HEMOGLOBIN: 12.2 g/dL (ref 12.0–15.0)

## 2015-08-28 SURGERY — EGD (ESOPHAGOGASTRODUODENOSCOPY)
Anesthesia: Moderate Sedation

## 2015-08-28 MED ORDER — MEPERIDINE HCL 50 MG/ML IJ SOLN
INTRAMUSCULAR | Status: AC
  Filled 2015-08-28: qty 1

## 2015-08-28 MED ORDER — BUTAMBEN-TETRACAINE-BENZOCAINE 2-2-14 % EX AERO
INHALATION_SPRAY | CUTANEOUS | Status: DC | PRN
  Administered 2015-08-28: 2 via TOPICAL

## 2015-08-28 MED ORDER — MIDAZOLAM HCL 5 MG/5ML IJ SOLN
INTRAMUSCULAR | Status: DC | PRN
  Administered 2015-08-28: 3 mg via INTRAVENOUS
  Administered 2015-08-28 (×2): 2 mg via INTRAVENOUS
  Administered 2015-08-28: 1 mg via INTRAVENOUS
  Administered 2015-08-28: 2 mg via INTRAVENOUS

## 2015-08-28 MED ORDER — DEXLANSOPRAZOLE 60 MG PO CPDR: 60.0000 mg | DELAYED_RELEASE_CAPSULE | Freq: Every day | ORAL | Status: DC

## 2015-08-28 MED ORDER — MEPERIDINE HCL 50 MG/ML IJ SOLN
INTRAMUSCULAR | Status: DC | PRN
  Administered 2015-08-28 (×3): 25 mg via INTRAVENOUS

## 2015-08-28 MED ORDER — STERILE WATER FOR IRRIGATION IR SOLN
Status: DC | PRN
Start: 2015-08-28 — End: 2015-08-28
  Administered 2015-08-28: 10:00:00

## 2015-08-28 MED ORDER — SODIUM CHLORIDE 0.9 % IV SOLN
INTRAVENOUS | Status: DC
Start: 2015-08-28 — End: 2015-08-28
  Administered 2015-08-28: 09:00:00 via INTRAVENOUS

## 2015-08-28 MED ORDER — MIDAZOLAM HCL 5 MG/5ML IJ SOLN
INTRAMUSCULAR | Status: AC
  Filled 2015-08-28: qty 10

## 2015-08-28 NOTE — Telephone Encounter (Signed)
Dr.Scott to speak with Dr.[name] 

## 2015-08-28 NOTE — Discharge Instructions (Signed)
No aspirin EC or Goody powder as well as no ibuprofen or other NSAIDs. Discontinue Pepcid. Begin Dexilant 60 mg by mouth 30 minutes before breakfast daily. First dose today(samples). Soft diet. Multiple small meals. No driving for 24 hours. Physician will call with biopsy results. Office visit in 2-3 weeks.     Esophagogastroduodenoscopy, Care After Refer to this sheet in the next few weeks. These instructions provide you with information about caring for yourself after your procedure. Your health care provider may also give you more specific instructions. Your treatment has been planned according to current medical practices, but problems sometimes occur. Call your health care provider if you have any problems or questions after your procedure. WHAT TO EXPECT AFTER THE PROCEDURE After your procedure, it is typical to feel:  Soreness in your throat.  Pain with swallowing.  Sick to your stomach (nauseous).  Bloated.  Dizzy.  Fatigued. HOME CARE INSTRUCTIONS  Do not eat or drink anything until the numbing medicine (local anesthetic) has worn off and your gag reflex has returned. You will know that the local anesthetic has worn off when you can swallow comfortably.  Do not drive or operate machinery until directed by your health care provider.  Take medicines only as directed by your health care provider. SEEK MEDICAL CARE IF:   You cannot stop coughing.  You are not urinating at all or less than usual. SEEK IMMEDIATE MEDICAL CARE IF:  You have difficulty swallowing.  You cannot eat or drink.  You have worsening throat or chest pain.  You have dizziness or lightheadedness or you faint.  You have nausea or vomiting.  You have chills.  You have a fever.  You have severe abdominal pain.  You have black, tarry, or bloody stools.   This information is not intended to replace advice given to you by your health care provider. Make sure you discuss any questions you  have with your health care provider.   Document Released: 10/24/2012 Document Revised: 11/28/2014 Document Reviewed: 10/24/2012 Elsevier Interactive Patient Education [employer].

## 2015-08-28 NOTE — H&P (Signed)
Amanda Osborne is an 64 y.o. female.   Chief Complaint: She is here for EGD. HPI: Patient is 64 year old Caucasian female who underwent gastric bypass surgery 1981 for movement obesity who now presents with intermittent right upper quadrant pain she also complains of burning in chest which she describes as having sore throat but it slowed down. She denies dysphagia. She is having nausea and vomiting usually after breakfast. She vomits liquid but never food. She denies melena or rectal bleeding. She was noted to have heme-positive stool. She had EGD back in July 2009 and was found to have service at [facility]. She was on omeprazole but she stopped because of concerns for kidney damage. She states Pepcid is not controlling her symptoms at all. She believes her pouches shrinking. She has lost 40 pounds over the last 2-3 years. She had colonoscopy in May 2009 and was reportedly normal. CBC was normal on 04/29/2015. Hemoglobin was 14.1.  Past Medical History  Diagnosis Date  . Wrist fracture, bilateral 2005 and 2010    right then left  . Cervical disc herniation     c5 and c6  . Narcolepsy   . Occipital neuralgia   . Iron deficiency anemia 11/30/2012  . Unspecified vitamin D deficiency 03/26/2013  . Chronic pain   . Fibromyalgia   . Complication of anesthesia   . PONV (postoperative nausea and vomiting)   . Hypertension   . Anxiety   . Headache(784.0)     Hx: of migraines until age 89's  . GERD (gastroesophageal reflux disease)   . Arthritis   . Hyperlipidemia     Past Surgical History  Procedure Laterality Date  . Anterior cervical decomp/discectomy fusion  2000  . Gastric bypass  1981  . Bone graft hip iliac crest  2006    to right wrist to correct fracture  . Fracture surgery      Hx: of right wrist surgery  . Tonsillectomy    . Colonoscopy      Hx; of  . Dilation and curettage of uterus    . Anterior cervical decomp/discectomy fusion N/A 08/05/2013    Procedure: Cervical  five-six Anterior cervical decompression/diskectomy/fusion/ Synthes plate removal;  Surgeon: Kristeen Miss, MD;  Location: [facility];  Service: Neurosurgery;  Laterality: N/A;    Family History  Problem Relation Age of Onset  . Stroke Mother   . Heart disease Mother   . Diabetes Mother   . Heart disease Father   . Cancer - Colon Father   . Other Brother    Social History:  reports that she has never smoked. She has never used smokeless tobacco. She reports that she does not drink alcohol or use illicit drugs.  Allergies:  Allergies  Allergen Reactions  . Celexa [Citalopram Hydrobromide] Nausea And Vomiting  . Neurontin [Gabapentin]     Drowsy   . Soma [Carisoprodol]     dizzy  . Statins Other (See Comments)    MYALGIAS    Medications Prior to Admission  Medication Sig Dispense Refill  . ALPRAZolam (XANAX) 0.5 MG tablet TAKE ONE TABLET TWO TIMES DAILY AS NEEDED (Patient taking differently: Take 0.5 mg by mouth 2 (two) times daily as needed for anxiety. TAKE ONE TABLET TWO TIMES DAILY AS NEEDED) 60 tablet 5  . Aspirin-Caffeine (BC FAST PAIN RELIEF) 845-65 MG PACK Take 1 packet by mouth daily.     . Cholecalciferol (VITAMIN D3) 10000 UNITS capsule Take 2,000 Units by mouth 2 (two) times  daily.     . famotidine (PEPCID) 20 MG tablet Take 1 tablet twice a day. (Patient taking differently: Take 20 mg by mouth 2 (two) times daily. Take 1 tablet twice a day.) 60 tablet 12  . fluticasone (FLONASE) 50 MCG/ACT nasal spray Place 2 sprays into both nostrils daily. 16 g 5  . hydrochlorothiazide (HYDRODIURIL) 25 MG tablet Take 1 tablet (25 mg total) by mouth daily. 90 tablet 3  . ibuprofen (ADVIL,MOTRIN) 200 MG tablet Take 400 mg by mouth 3 (three) times daily.    Marland Kitchen lisinopril (PRINIVIL,ZESTRIL) 5 MG tablet Take 1/2 tablet daily (Patient taking differently: Take 2.5 mg by mouth daily. Take 1/2 tablet daily) 90 tablet 1  . loratadine (CLARITIN) 10 MG tablet Take 10 mg by mouth daily.    .  Multiple Vitamin (MULTIVITAMIN WITH MINERALS) TABS tablet Take 1 tablet by mouth daily.    Marland Kitchen oxyCODONE (OXY IR/ROXICODONE) 5 MG immediate release tablet Take 1 tablet (5 mg total) by mouth every 4 (four) hours as needed for severe pain. 180 tablet 0  . polyethylene glycol powder (GLYCOLAX/MIRALAX) powder Take 17 g by mouth as needed (for constipation).     . pravastatin (PRAVACHOL) 20 MG tablet Take 1 tablet by mouth Monday, Wednesday, Friday. 12 tablet 12  . Ferrous Fumarate (FERRO-SEQUELS PO) Take 1 each by mouth 2 (two) times a week.     Marland Kitchen tiZANidine (ZANAFLEX) 4 MG tablet take 1 tablet by mouth every 8 hours if needed for muscle spasm 30 tablet 1    No results found for this or any previous visit (from the past 48 hour(s)). No results found.  ROS  Blood pressure 113/72, pulse 67, temperature 98 F (36.7 C), temperature source Oral, resp. rate 15, height 5' 6.5" (1.689 m), weight 170 lb (77.111 kg), SpO2 100 %. Physical Exam  Constitutional: She appears well-developed and well-nourished.  HENT:  Mouth/Throat: Oropharynx is clear and moist.  Eyes: Conjunctivae are normal. No scleral icterus.  Neck: No thyromegaly present.  Cardiovascular: Normal rate, regular rhythm and normal heart sounds.   No murmur heard. Respiratory: Effort normal and breath sounds normal.  GI:  Abdomen is full and soft. She has mild tenderness below the right costal margin. No organomegaly or masses.  Musculoskeletal: She exhibits no edema.  Lymphadenopathy:    She has no cervical adenopathy.  Neurological: She is alert.  Skin: Skin is warm and dry.     Assessment/Plan Heartburn, right upper quadrant abdominal pain and heme positive stool in a patient was at remote gastric bypass surgery and is on NSAIDs. Diagnostic EGD.   Millena Callins U 08/28/2015, 9:32 AM

## 2015-08-28 NOTE — Op Note (Signed)
EGD PROCEDURE REPORT  PATIENT:  Amanda Osborne  MR#:  [id number] Birthdate:  [birth year], 64 y.o., female Endoscopist:  Dr. Rogene Houston, MD Referred By:  Dr. Sallee Lange, MD  Procedure Date: 08/28/2015  Procedure:   EGD  Indications:  Patient is 64 year old Caucasian female who underwent bariatric surgery back in 1981 who presents with recurrent heartburn right upper quadrant pain and she was also noted to have heme-positive stool. She believes her pouches shrinking and she has lost 40 pounds over the last 2 years. She has frequent nausea with intermittent postprandial vomiting but she never throws of food. She usually frothy liquid. Patient is on ibuprofen as well as BC powder. PPI was stopped because of concern for kidney disease and she is now on Pepcid.            Informed Consent:  The risks, benefits, alternatives & imponderables which include, but are not limited to, bleeding, infection, perforation, drug reaction and potential missed lesion have been reviewed.  The potential for biopsy, lesion removal, esophageal dilation, etc. have also been discussed.  Questions have been answered.  All parties agreeable.  Please see history & physical in medical record for more information.  Medications:  Demerol 75 mg IV Versed 10 mg IV Cetacaine spray topically for oropharyngeal anesthesia  Description of procedure:  The endoscope was introduced through the mouth and advanced to the second portion of the duodenum without difficulty or limitations. The mucosal surfaces were surveyed very carefully during advancement of the scope and upon withdrawal.  Findings:  Esophagus:  Mucosa of the proximal and middle third was normal. Single ulcer noted at distal esophagus extending to GE junction where she had high-grade stricture. This ulcer was estimated to be about 6 x 10 mm. Q-scope not be passed across the stricture. Therefore Slim scope with outer diameter of 7 mm was used. Examination. GEJ:  34  cm Stomach:  Small gastric pouch which allowed retroflexion of the scope. It was erythema to mucosa of gastric pouch. Silk suture identified. Gastrojejunostomy was wide open. Second opening towards the lesser curve a chair was marked by an ulcer eating into stomach. Jejunum:  Multiple geographic ulcers were noted involving 20 cm of jejunal mucosa which was examined.  Therapeutic/Diagnostic Maneuvers Performed:  Multiple biopsies taken from ulcerated jejunal mucosa.  Complications:  None  EBL: Minimal  Impression: Single ulcer distal esophagus extending to GE junction with high-grade(GEJ). Small gastric pouch with changes of gastritis but wide-open gastrojejunal anastomosis. Ulcer noted to this segment extending into distal stomach. Multiple ulcers noted involving 20 cm of jejunal mucosa. Ultimate biopsies taken.  Recommendations:  Standard instructions given. Discontinue Pepcid. Begin Dexilant 60 mg by mouth every morning(samples). Patient advised to refrain from taking NSAIDs. Will check H&H today. Office visit in 2-3 weeks. I will be contacting patient with biopsy results.  Amanda Osborne  08/28/2015  10:09 AM  CC: Dr. Sallee Lange, MD & Dr. Rayne Du ref. provider found

## 2015-08-30 NOTE — Telephone Encounter (Signed)
I spoke with the gastroenterologist regarding this patient. There is significant concerns regarding stomach ulcers. This patient must stay away from all NSAIDs. He has informed her of this. He will monitor the ulcer. There is also a esophageal stricture which they will monitor as well. The patient is aware of all of this according to the gastroenterologist.

## 2015-09-03 ENCOUNTER — Encounter (HOSPITAL_COMMUNITY): Payer: Self-pay | Admitting: Internal Medicine

## 2015-09-10 ENCOUNTER — Encounter: Payer: Self-pay | Admitting: Family Medicine

## 2015-09-10 ENCOUNTER — Ambulatory Visit (INDEPENDENT_AMBULATORY_CARE_PROVIDER_SITE_OTHER): Payer: Medicare Other | Admitting: Nurse Practitioner

## 2015-09-10 ENCOUNTER — Telehealth: Payer: Self-pay | Admitting: *Deleted

## 2015-09-10 ENCOUNTER — Encounter: Payer: Self-pay | Admitting: Nurse Practitioner

## 2015-09-10 VITALS — BP 112/68 | Ht 65.75 in | Wt 167.0 lb

## 2015-09-10 DIAGNOSIS — M4010 Other secondary kyphosis, site unspecified: Secondary | ICD-10-CM

## 2015-09-10 DIAGNOSIS — Z780 Asymptomatic menopausal state: Secondary | ICD-10-CM | POA: Diagnosis not present

## 2015-09-10 DIAGNOSIS — M810 Age-related osteoporosis without current pathological fracture: Secondary | ICD-10-CM

## 2015-09-10 DIAGNOSIS — Z0000 Encounter for general adult medical examination without abnormal findings: Secondary | ICD-10-CM

## 2015-09-10 NOTE — Telephone Encounter (Signed)
Please forward the letter to the patient. If she requests how she takes the medications to be written out on the letter please forward that to the nurses to complete. If she does not require that then go ahead and give her/send her the letter Thank you

## 2015-09-10 NOTE — Progress Notes (Signed)
Subjective:    Patient ID: Amanda Osborne, female    DOB: 12/10/1950, 64 y.o.   MRN: [id number]  HPI AWV- Annual Wellness Visit  The patient was seen for their annual wellness visit. The patient's past medical history, surgical history, and family history were reviewed. Pertinent vaccines were reviewed ( tetanus, pneumonia, shingles, flu) declines flu and shingles vaccine The patient's medication list was reviewed and updated.  The height and weight were entered. The patient's current BMI is: 27.16  Cognitive screening was completed. Outcome of Mini - Cog: pass  Falls within the past 6 months: none  Current tobacco usage: none (All patients who use tobacco were given written and verbal information on quitting)  Recent listing of emergency department/hospitalizations over the past year were reviewed.  current specialist the patient sees on a regular basis: not on regular basis. Recently saw dr Laural Golden for endoscopy   Medicare annual wellness visit patient questionnaire was reviewed.  A written screening schedule for the patient for the next 5-10 years was given. Appropriate discussion of followup regarding next visit was discussed.  Declines flu and shingles vaccine.  Colonoscopy 2009   presents for her wellness exam. No new sexual partners. No vaginal bleeding or pelvic pain. Regular vision exams. Is due for a dental exam.   Review of Systems  Constitutional: Negative for activity change, appetite change and fatigue.  HENT: Negative for dental problem, ear pain, sinus pressure and sore throat.   Respiratory: Negative for cough, chest tightness, shortness of breath and wheezing.   Cardiovascular: Negative for chest pain.  Gastrointestinal: Negative for nausea, vomiting, abdominal pain, diarrhea, constipation and abdominal distention.  Genitourinary: Negative for dysuria, urgency, frequency, vaginal bleeding, vaginal discharge, enuresis, difficulty urinating, genital sores  and pelvic pain.       Objective:   Physical Exam  Constitutional: She is oriented to person, place, and time. She appears well-developed. No distress.  HENT:  Right Ear: External ear normal.  Left Ear: External ear normal.  Mouth/Throat: Oropharynx is clear and moist.  Neck: Normal range of motion. Neck supple. No tracheal deviation present. No thyromegaly present.  Cardiovascular: Normal rate, regular rhythm and normal heart sounds.  Exam reveals no gallop.   No murmur heard. Pulmonary/Chest: Effort normal and breath sounds normal.  Abdominal: Soft. She exhibits no distension. There is no tenderness.  Genitourinary: Vagina normal and uterus normal. No vaginal discharge found.  External GU no rashes or lesions. Vagina pale, no discharge. No CMT. Bimanual exam no tenderness or obvious masses. Rectal exam no masses, no stool for Hemoccult.  Musculoskeletal: She exhibits no edema.  Lymphadenopathy:    She has no cervical adenopathy.  Neurological: She is alert and oriented to person, place, and time.  Skin: Skin is warm and dry. No rash noted.  Psychiatric: She has a normal mood and affect. Her behavior is normal.  Vitals reviewed. Breast exam: no masses; axillae no adenopathy.        Assessment & Plan:   Problem List Items Addressed This Visit      Musculoskeletal and Integument   Kyphosis   Osteoporosis   Relevant Orders   Vit D  25 hydroxy (rtn osteoporosis monitoring)   DG Bone Density    Other Visit Diagnoses    Routine general medical examination at a health care facility    -  Primary    Post-menopausal        Relevant Orders    DG Bone Density  Recommend daily vitamin D and calcium supplementation.  Return in about 1 year (around 09/09/2016) for physical. Otherwise routine follow-up as planned.

## 2015-09-10 NOTE — Telephone Encounter (Signed)
Pt needs letter stating it is medically necessary that she takes miralax, iron, vit d3, b12, and magnessium. See letter in folder. Also needs form filled out for dexilant. Pt requesting 90 day supply with 3 refills. If you agree forms are filled out you need to sign and date page 2. Forms are in folder with note from pt.

## 2015-09-11 NOTE — Telephone Encounter (Signed)
Mailed to patient

## 2015-09-15 ENCOUNTER — Encounter (INDEPENDENT_AMBULATORY_CARE_PROVIDER_SITE_OTHER): Payer: Self-pay | Admitting: *Deleted

## 2015-09-15 ENCOUNTER — Encounter: Payer: Self-pay | Admitting: Nurse Practitioner

## 2015-09-15 ENCOUNTER — Other Ambulatory Visit (INDEPENDENT_AMBULATORY_CARE_PROVIDER_SITE_OTHER): Payer: Self-pay | Admitting: Internal Medicine

## 2015-09-15 ENCOUNTER — Ambulatory Visit (INDEPENDENT_AMBULATORY_CARE_PROVIDER_SITE_OTHER): Payer: Medicare Other | Admitting: Internal Medicine

## 2015-09-15 ENCOUNTER — Encounter (INDEPENDENT_AMBULATORY_CARE_PROVIDER_SITE_OTHER): Payer: Self-pay | Admitting: Internal Medicine

## 2015-09-15 VITALS — BP 102/62 | HR 64 | Temp 97.3°F | Ht 66.0 in | Wt 172.2 lb

## 2015-09-15 DIAGNOSIS — K222 Esophageal obstruction: Secondary | ICD-10-CM | POA: Diagnosis not present

## 2015-09-15 DIAGNOSIS — R1310 Dysphagia, unspecified: Secondary | ICD-10-CM

## 2015-09-15 DIAGNOSIS — K257 Chronic gastric ulcer without hemorrhage or perforation: Secondary | ICD-10-CM | POA: Diagnosis not present

## 2015-09-15 DIAGNOSIS — K259 Gastric ulcer, unspecified as acute or chronic, without hemorrhage or perforation: Secondary | ICD-10-CM

## 2015-09-15 NOTE — Progress Notes (Addendum)
Subjective:    Patient ID: Amanda Osborne, female    DOB: Dec 05, 1950, 64 y.o.   MRN: [id number]  HPI Here today for f/u. She underwent an EGD which revealed multiple ulcers and a high grade stricture. She tells me she is doing good. She has only had two episodes of vomiting since the EGD the 1st week in October. Appetite is good. No acid reflux.  Presently taking Dexilant which controls. Foods are slow to go down. She is chewing her food well. BMs are normal as long as she takes her Miralax.   No NSAIDS.     08/28/2015 EGD  Indications: Patient is 64 year old Caucasian female who underwent bariatric surgery back in 1981 who presents with recurrent heartburn right upper quadrant pain and she was also noted to have heme-positive stool. She believes her pouches shrinking and she has lost 40 pounds over the last 2 years. She has frequent nausea with intermittent postprandial vomiting but she never throws of food. She usually frothy liquid. Patient is on ibuprofen as well as BC powder. PPI was stopped because of concern for kidney disease and she is now on Pepcid.  Impression: Single ulcer distal esophagus extending to GE junction with high-grade(GEJ). Small gastric pouch with changes of gastritis but wide-open gastrojejunal anastomosis. Ulcer noted to this segment extending into distal stomach. Multiple ulcers noted involving 20 cm of jejunal mucosa. Ultimate biopsies taken.  Biopsy results reviewed with patient. She has benign small bowel ulcers secondary to NSAID therapy. She also has high-grade esophageal stricture which would be dilated in the next few weeks which time hopefully esophageal ulcer would've healed. Once again patient reminded that she should not take NSAIDs. Review of Systems Past Medical History  Diagnosis Date  . Wrist fracture, bilateral 2005 and 2010    right then left  . Cervical disc herniation     c5 and c6  . Narcolepsy   . Occipital neuralgia   . Iron deficiency anemia 11/30/2012  . Unspecified vitamin D deficiency 03/26/2013  . Chronic pain   . Fibromyalgia   . Complication of anesthesia   . PONV (postoperative nausea and vomiting)   . Hypertension   . Anxiety   . Headache(784.0)     Hx: of migraines until age 40's  . GERD (gastroesophageal reflux disease)   . Arthritis   . Hyperlipidemia     Past Surgical History  Procedure Laterality Date  . Anterior cervical decomp/discectomy fusion  2000  . Gastric bypass  1981  . Bone graft hip iliac crest  2006    to right wrist to correct fracture  . Fracture surgery      Hx: of right wrist surgery  . Tonsillectomy    . Colonoscopy      Hx; of  . Dilation and curettage of uterus    . Anterior cervical decomp/discectomy fusion N/A 08/05/2013    Procedure: Cervical five-six Anterior cervical decompression/diskectomy/fusion/ Synthes plate removal;  Surgeon: Kristeen Miss, MD;  Location: [facility];  Service: Neurosurgery;  Laterality: N/A;  . Esophagogastroduodenoscopy N/A 08/28/2015    Procedure: ESOPHAGOGASTRODUODENOSCOPY (EGD);  Surgeon: Rogene Houston, MD;  Location: [facility];  Service: Endoscopy;  Laterality: N/A;  910am    Allergies  Allergen Reactions  . Celexa [Citalopram Hydrobromide] Nausea And Vomiting  . Neurontin [Gabapentin]     Drowsy   . Soma [Carisoprodol]     dizzy  . Statins Other (See Comments)    MYALGIAS    Current  Outpatient Prescriptions on File Prior to Visit  Medication Sig Dispense Refill  . ALPRAZolam (XANAX) 0.5 MG tablet TAKE ONE TABLET TWO TIMES DAILY AS NEEDED (Patient taking differently: Take 0.5 mg by mouth 2 (two) times daily as needed for anxiety. TAKE ONE TABLET TWO TIMES DAILY AS NEEDED) 60 tablet 5  . Cholecalciferol (VITAMIN D3) 10000 UNITS capsule Take 2,000 Units by mouth 2 (two) times daily.     Marland Kitchen dexlansoprazole (DEXILANT)  60 MG capsule Take 1 capsule (60 mg total) by mouth daily. 30 capsule 2  . Ferrous Fumarate (FERRO-SEQUELS PO) Take 1 each by mouth 2 (two) times a week.     . fluticasone (FLONASE) 50 MCG/ACT nasal spray Place 2 sprays into both nostrils daily. 16 g 5  . hydrochlorothiazide (HYDRODIURIL) 25 MG tablet Take 1 tablet (25 mg total) by mouth daily. 90 tablet 3  . lisinopril (PRINIVIL,ZESTRIL) 5 MG tablet Take 1/2 tablet daily (Patient taking differently: Take 2.5 mg by mouth daily. Take 1/2 tablet daily) 90 tablet 1  . loratadine (CLARITIN) 10 MG tablet Take 10 mg by mouth daily.    . Multiple Vitamin (MULTIVITAMIN WITH MINERALS) TABS tablet Take 1 tablet by mouth daily.    Marland Kitchen oxyCODONE (OXY IR/ROXICODONE) 5 MG immediate release tablet Take 1 tablet (5 mg total) by mouth every 4 (four) hours as needed for severe pain. 180 tablet 0  . polyethylene glycol powder (GLYCOLAX/MIRALAX) powder Take 17 g by mouth as needed (for constipation).     . pravastatin (PRAVACHOL) 20 MG tablet Take 1 tablet by mouth Monday, Wednesday, Friday. 12 tablet 12  . tiZANidine (ZANAFLEX) 4 MG tablet take 1 tablet by mouth every 8 hours if needed for muscle spasm 30 tablet 1   No current facility-administered medications on file prior to visit.        Objective:   Physical Exam Blood pressure 102/62, pulse 64, temperature 97.3 F (36.3 C), height 5\' 6"  (1.676 m), weight 172 lb 3.2 oz (78.109 kg).  Alert and oriented. Skin warm and dry. Oral mucosa is moist.   . Sclera anicteric, conjunctivae is pink. Thyroid not enlarged. No cervical lymphadenopathy. Lungs clear. Heart regular rate and rhythm.  Abdomen is soft. Bowel sounds are positive. No hepatomegaly. No abdominal masses felt. No tenderness.  No edema to lower extremities.         Assessment & Plan:  Plan for EGD/ED  In 3-4 weeks.  No NSAIDSThe risks and benefits such as perforation, bleeding, and infection were reviewed with the patient and is agreeable. Samples of  Nexium #10 boxes (20 pills) given to patient.

## 2015-09-15 NOTE — Patient Instructions (Signed)
EGD/ED. The risks and benefits such as perforation, bleeding, and infection were reviewed with the patient and is agreeable. 

## 2015-09-18 ENCOUNTER — Ambulatory Visit (HOSPITAL_COMMUNITY)
Admission: RE | Admit: 2015-09-18 | Discharge: 2015-09-18 | Disposition: A | Discharge status: Home / Self Care | Payer: Medicare Other | Source: Ambulatory Visit | Attending: Nurse Practitioner | Admitting: Nurse Practitioner

## 2015-09-18 DIAGNOSIS — E559 Vitamin D deficiency, unspecified: Secondary | ICD-10-CM | POA: Insufficient documentation

## 2015-09-18 DIAGNOSIS — M810 Age-related osteoporosis without current pathological fracture: Secondary | ICD-10-CM | POA: Insufficient documentation

## 2015-09-18 DIAGNOSIS — Z780 Asymptomatic menopausal state: Secondary | ICD-10-CM | POA: Insufficient documentation

## 2015-10-08 ENCOUNTER — Telehealth (INDEPENDENT_AMBULATORY_CARE_PROVIDER_SITE_OTHER): Payer: Self-pay | Admitting: *Deleted

## 2015-10-08 NOTE — Telephone Encounter (Signed)
Amanda Osborne would like to know if is okay for her to take Liquid Motrin. The return phone number is [phone].

## 2015-10-09 NOTE — Telephone Encounter (Signed)
You cannot  take any NSAIDs.

## 2015-10-20 ENCOUNTER — Ambulatory Visit (INDEPENDENT_AMBULATORY_CARE_PROVIDER_SITE_OTHER): Payer: Medicare Other | Admitting: Family Medicine

## 2015-10-20 ENCOUNTER — Encounter: Payer: Self-pay | Admitting: Family Medicine

## 2015-10-20 VITALS — BP 120/78 | Ht 65.75 in | Wt 170.8 lb

## 2015-10-20 DIAGNOSIS — M549 Dorsalgia, unspecified: Secondary | ICD-10-CM | POA: Diagnosis not present

## 2015-10-20 DIAGNOSIS — M67432 Ganglion, left wrist: Secondary | ICD-10-CM

## 2015-10-20 DIAGNOSIS — E785 Hyperlipidemia, unspecified: Secondary | ICD-10-CM

## 2015-10-20 DIAGNOSIS — G8929 Other chronic pain: Secondary | ICD-10-CM

## 2015-10-20 DIAGNOSIS — M810 Age-related osteoporosis without current pathological fracture: Secondary | ICD-10-CM | POA: Diagnosis not present

## 2015-10-20 DIAGNOSIS — M67431 Ganglion, right wrist: Secondary | ICD-10-CM | POA: Diagnosis not present

## 2015-10-20 MED ORDER — OXYCODONE HCL 5 MG PO TABS: 5.0000 mg | ORAL_TABLET | ORAL | Status: DC | PRN

## 2015-10-20 NOTE — Progress Notes (Signed)
Subjective:    Patient ID: Amanda Osborne, female    DOB: 03-Jul-1951, 64 y.o.   MRN: [id number]  HPI   This patient was seen today for chronic pain  The medication list was reviewed and updated.   -Compliance with medication: yes  - Number patient states they take daily: 6 a day  -when was the last dose patient took?  One hour ago   The patient was advised the importance of maintaining medication and not using illegal substances with these.  Refills needed: yes  The patient was educated that we can provide 3 monthly scripts for their medication, it is their responsibility to follow the instructions.  Side effects or complications from medications: no Patient is aware that pain medications are meant to minimize the severity of the pain to allow their pain levels to improve to allow for better function. They are aware of that pain medications cannot totally remove their pain.  Due for UDT ( at least once per year) : utd  Patient with osteoporosis does not one have any type of infusion because of cost see discussion below Ganglion cyst in her wrists she states not bad enough to see orthopedist she will let us know if progressive troubles Patient with chronic pain pain medicine does not do enough but she wants to stick with current dose does not want to go up on the dosage denies a causing any problems Patient with arthralgias and arthritis once to be back on NSAIDs but those actually cause serious damage to her it is not recommended for her to be on these.   Review of Systems  Constitutional: Negative for activity change and appetite change.  Gastrointestinal: Negative for vomiting and abdominal pain.  Neurological: Negative for weakness.  Psychiatric/Behavioral: Negative for confusion.       Objective:   Physical Exam  Constitutional: She appears well-nourished. No distress.  HENT:  Head: Normocephalic.  Cardiovascular: Normal rate, regular rhythm and normal heart  sounds.   No murmur heard. Pulmonary/Chest: Effort normal and breath sounds normal.  Musculoskeletal: She exhibits no edema.  Lymphadenopathy:    She has no cervical adenopathy.  Neurological: She is alert.  Psychiatric: Her behavior is normal.  Vitals reviewed.  25 minutes was spent with the patient. Greater than half the time was spent in discussion and answering questions and counseling regarding the issues that the patient came in for today. Highly complex patient took significant amount of time working through all the issues       Assessment & Plan:  The patient was seen today as part of a comprehensive visit regarding pain control. Patient's compliance with the medication as well as discussion regarding effectiveness was completed. Prescriptions were written. Patient was advised to follow-up in 3 months. The patient was assessed for any signs of severe side effects. The patient was advised to take the medicine as directed and to report to [place] if any side effect issues.   This patient states she has a small esophagus and relates that she did have trouble with things going down it's supposed to be stretch this week she also had a recent endoscopy which showed severe erosions and even small intestine ulcers it was highly recommended for her to stay away from anti-inflammatories she is under the impression that she can restart anti-inflammatory soon I told her that this would absolutely not be a good idea in this would only be if her gastroenterologist approves it.  The patient does not want  to go up on pain medications. She will state she wants to stay where she has 3 prescriptions given  Hyperlipidemia she needs to take a lipid profile  She is on diuretic blood pressure good recent metabolic 7 look good  Patient has osteoporosis she refuses IV infusion because she states she cannot afford the co-pay I encouraged her to connect with the business office to see if she qualifies for  financial assistance  This patient suffers with chronic pain which makes her situation difficult I find it causes her to feel very frustrated and not enjoy day-to-day life unfortunately I doubt her chronic pain will ever go away we tried to take the edge off off of it is best as possible patient does not one to go to pain management

## 2015-10-20 NOTE — Patient Instructions (Signed)

## 2015-10-22 ENCOUNTER — Ambulatory Visit (HOSPITAL_COMMUNITY)
Admission: RE | Admit: 2015-10-22 | Discharge: 2015-10-22 | Disposition: A | Discharge status: Home / Self Care | Payer: Medicare Other | Source: Ambulatory Visit | Attending: Internal Medicine | Admitting: Internal Medicine

## 2015-10-22 ENCOUNTER — Encounter (HOSPITAL_COMMUNITY): Payer: Self-pay | Admitting: *Deleted

## 2015-10-22 ENCOUNTER — Encounter (HOSPITAL_COMMUNITY)
Admission: RE | Disposition: A | Discharge status: Home / Self Care | Payer: Self-pay | Source: Ambulatory Visit | Attending: Internal Medicine

## 2015-10-22 DIAGNOSIS — E785 Hyperlipidemia, unspecified: Secondary | ICD-10-CM | POA: Insufficient documentation

## 2015-10-22 DIAGNOSIS — K5900 Constipation, unspecified: Secondary | ICD-10-CM | POA: Insufficient documentation

## 2015-10-22 DIAGNOSIS — K259 Gastric ulcer, unspecified as acute or chronic, without hemorrhage or perforation: Secondary | ICD-10-CM | POA: Diagnosis not present

## 2015-10-22 DIAGNOSIS — K219 Gastro-esophageal reflux disease without esophagitis: Secondary | ICD-10-CM | POA: Diagnosis not present

## 2015-10-22 DIAGNOSIS — R1310 Dysphagia, unspecified: Secondary | ICD-10-CM | POA: Insufficient documentation

## 2015-10-22 DIAGNOSIS — I10 Essential (primary) hypertension: Secondary | ICD-10-CM | POA: Diagnosis not present

## 2015-10-22 DIAGNOSIS — M797 Fibromyalgia: Secondary | ICD-10-CM | POA: Diagnosis not present

## 2015-10-22 DIAGNOSIS — K222 Esophageal obstruction: Secondary | ICD-10-CM | POA: Insufficient documentation

## 2015-10-22 DIAGNOSIS — Z79899 Other long term (current) drug therapy: Secondary | ICD-10-CM | POA: Diagnosis not present

## 2015-10-22 DIAGNOSIS — F419 Anxiety disorder, unspecified: Secondary | ICD-10-CM | POA: Diagnosis not present

## 2015-10-22 DIAGNOSIS — Z7951 Long term (current) use of inhaled steroids: Secondary | ICD-10-CM | POA: Diagnosis not present

## 2015-10-22 DIAGNOSIS — R112 Nausea with vomiting, unspecified: Secondary | ICD-10-CM | POA: Diagnosis not present

## 2015-10-22 DIAGNOSIS — R634 Abnormal weight loss: Secondary | ICD-10-CM | POA: Insufficient documentation

## 2015-10-22 DIAGNOSIS — R1013 Epigastric pain: Secondary | ICD-10-CM | POA: Diagnosis not present

## 2015-10-22 DIAGNOSIS — K289 Gastrojejunal ulcer, unspecified as acute or chronic, without hemorrhage or perforation: Secondary | ICD-10-CM | POA: Insufficient documentation

## 2015-10-22 HISTORY — PX: ESOPHAGEAL DILATION: SHX303

## 2015-10-22 HISTORY — PX: ESOPHAGOGASTRODUODENOSCOPY: SHX5428

## 2015-10-22 SURGERY — EGD (ESOPHAGOGASTRODUODENOSCOPY)
Anesthesia: Moderate Sedation

## 2015-10-22 MED ORDER — SODIUM CHLORIDE 0.9 % IV SOLN
INTRAVENOUS | Status: DC
  Administered 2015-10-22: 11:00:00 via INTRAVENOUS

## 2015-10-22 MED ORDER — MEPERIDINE HCL 50 MG/ML IJ SOLN
INTRAMUSCULAR | Status: AC
  Filled 2015-10-22: qty 1

## 2015-10-22 MED ORDER — MIDAZOLAM HCL 5 MG/5ML IJ SOLN
INTRAMUSCULAR | Status: AC
  Filled 2015-10-22: qty 10

## 2015-10-22 MED ORDER — MIDAZOLAM HCL 5 MG/5ML IJ SOLN
INTRAMUSCULAR | Status: AC
  Filled 2015-10-22: qty 5

## 2015-10-22 MED ORDER — BUTAMBEN-TETRACAINE-BENZOCAINE 2-2-14 % EX AERO
INHALATION_SPRAY | CUTANEOUS | Status: AC
  Filled 2015-10-22: qty 20

## 2015-10-22 MED ORDER — BUTAMBEN-TETRACAINE-BENZOCAINE 2-2-14 % EX AERO
INHALATION_SPRAY | CUTANEOUS | Status: DC | PRN
  Administered 2015-10-22: 2 via TOPICAL

## 2015-10-22 MED ORDER — STERILE WATER FOR IRRIGATION IR SOLN
Status: DC | PRN
  Administered 2015-10-22: 12:00:00

## 2015-10-22 MED ORDER — MIDAZOLAM HCL 5 MG/5ML IJ SOLN
INTRAMUSCULAR | Status: DC | PRN
  Administered 2015-10-22: 2 mg via INTRAVENOUS
  Administered 2015-10-22: 1 mg via INTRAVENOUS
  Administered 2015-10-22 (×3): 3 mg via INTRAVENOUS

## 2015-10-22 MED ORDER — MEPERIDINE HCL 50 MG/ML IJ SOLN
INTRAMUSCULAR | Status: DC | PRN
  Administered 2015-10-22 (×3): 25 mg

## 2015-10-22 NOTE — H&P (Signed)
Amanda Osborne is an 64 y.o. female.   Chief Complaint: Patient's here for EGD and possible ED. HPI: Patient is 64 year old Caucasian female who was recently evaluated for nausea vomiting and epigastric pain dysphagia as well as 40 pound weight loss. She has remote history of bariatric surgery. EGD 8 weeks ago revealed distal esophageal ulcer with stricture anastomotic ulcer as well as multiple ulcers in the jejunum. Biopsy revealed benign etiology. Esophagus was not dilated because of ulceration. Patient was advised to discontinue NSAIDs. She feels much better. She has not lost any more weight. She's had very few episodes of nausea and/or vomiting. She still has dysphagia.  Past Medical History  Diagnosis Date  . Wrist fracture, bilateral 2005 and 2010    right then left  . Cervical disc herniation     c5 and c6  . Narcolepsy   . Occipital neuralgia   . Iron deficiency anemia 11/30/2012  . Unspecified vitamin D deficiency 03/26/2013  . Chronic pain   . Fibromyalgia   . Complication of anesthesia   . PONV (postoperative nausea and vomiting)   . Hypertension   . Anxiety   . Headache(784.0)     Hx: of migraines until age 64's  . GERD (gastroesophageal reflux disease)   . Arthritis   . Hyperlipidemia     Past Surgical History  Procedure Laterality Date  . Anterior cervical decomp/discectomy fusion  2000  . Gastric bypass  1981  . Bone graft hip iliac crest  2006    to right wrist to correct fracture  . Fracture surgery      Hx: of right wrist surgery  . Tonsillectomy    . Colonoscopy      Hx; of  . Dilation and curettage of uterus    . Anterior cervical decomp/discectomy fusion N/A 08/05/2013    Procedure: Cervical five-six Anterior cervical decompression/diskectomy/fusion/ Synthes plate removal;  Surgeon: Kristeen Miss, MD;  Location: [facility];  Service: Neurosurgery;  Laterality: N/A;  . Esophagogastroduodenoscopy N/A 08/28/2015    Procedure: ESOPHAGOGASTRODUODENOSCOPY (EGD);   Surgeon: Rogene Houston, MD;  Location: [facility];  Service: Endoscopy;  Laterality: N/A;  910am    Family History  Problem Relation Age of Onset  . Stroke Mother   . Heart disease Mother   . Diabetes Mother   . Heart disease Father   . Cancer - Colon Father   . Other Brother    Social History:  reports that she has never smoked. She has never used smokeless tobacco. She reports that she does not drink alcohol or use illicit drugs.  Allergies:  Allergies  Allergen Reactions  . Celexa [Citalopram Hydrobromide] Nausea And Vomiting  . Neurontin [Gabapentin]     Drowsy   . Soma [Carisoprodol]     dizzy  . Statins Other (See Comments)    MYALGIAS    Medications Prior to Admission  Medication Sig Dispense Refill  . ALPRAZolam (XANAX) 0.5 MG tablet TAKE ONE TABLET TWO TIMES DAILY AS NEEDED (Patient taking differently: Take 0.5 mg by mouth 2 (two) times daily as needed for anxiety. TAKE ONE TABLET TWO TIMES DAILY AS NEEDED) 60 tablet 5  . Cholecalciferol (VITAMIN D3) 10000 UNITS capsule Take 2,000 Units by mouth 2 (two) times daily.     . Cyanocobalamin (VITAMIN B-12 PO) Take 1 mL by mouth daily.    Marland Kitchen dexlansoprazole (DEXILANT) 60 MG capsule Take 1 capsule (60 mg total) by mouth daily. 30 capsule 2  . fluticasone (  FLONASE) 50 MCG/ACT nasal spray Place 2 sprays into both nostrils daily. 16 g 5  . hydrochlorothiazide (HYDRODIURIL) 25 MG tablet Take 1 tablet (25 mg total) by mouth daily. 90 tablet 3  . lisinopril (PRINIVIL,ZESTRIL) 5 MG tablet Take 1/2 tablet daily (Patient taking differently: Take 2.5 mg by mouth daily. Take 1/2 tablet daily) 90 tablet 1  . MAGNESIUM CITRATE PO Take 1 capsule by mouth daily.    Marland Kitchen oxyCODONE (OXY IR/ROXICODONE) 5 MG immediate release tablet Take 1 tablet (5 mg total) by mouth every 4 (four) hours as needed for severe pain. 180 tablet 0  . polyethylene glycol powder (GLYCOLAX/MIRALAX) powder Take 17 g by mouth as needed (for constipation).     .  pravastatin (PRAVACHOL) 20 MG tablet Take 1 tablet by mouth Monday, Wednesday, Friday. 12 tablet 12  . tiZANidine (ZANAFLEX) 4 MG tablet take 1 tablet by mouth every 8 hours if needed for muscle spasm 30 tablet 1    No results found for this or any previous visit (from the past 48 hour(s)). No results found.  ROS  Blood pressure 131/66, pulse 71, temperature 97.7 F (36.5 C), temperature source Oral, resp. rate 13, height 5' 5.75" (1.67 m), weight 170 lb (77.111 kg), SpO2 100 %. Physical Exam  Constitutional: She appears well-developed and well-nourished.  HENT:  Mouth/Throat: Oropharynx is clear and moist.  Eyes: Conjunctivae are normal. No scleral icterus.  Neck: No thyromegaly present.  Cardiovascular: Normal rate, regular rhythm and normal heart sounds.   No murmur heard. Respiratory: Effort normal.  GI: Soft. Tenderness: mild midepigastric tenderness. There is no guarding.  Musculoskeletal: She exhibits no edema.  Lymphadenopathy:    She has no cervical adenopathy.  Neurological: She is alert.  Skin: Skin is warm and dry.     Assessment/Plan Dysphagia. History of esophageal stricture as well as gastric and small bowel ulcers. EGD to document healing of ulcers and esophageal dilation.  Amanda Osborne 10/22/2015, 11:39 AM

## 2015-10-22 NOTE — Discharge Instructions (Signed)
Resume usual medications and diet. Remember to eat multiple small meals and chew food thoroughly. No driving for 24 hours. Office visit in 2 months.   Esophagogastroduodenoscopy, Care After Refer to this sheet in the next few weeks. These instructions provide you with information about caring for yourself after your procedure. Your health care provider may also give you more specific instructions. Your treatment has been planned according to current medical practices, but problems sometimes occur. Call your health care provider if you have any problems or questions after your procedure. WHAT TO EXPECT AFTER THE PROCEDURE After your procedure, it is typical to feel:  Soreness in your throat.  Pain with swallowing.  Sick to your stomach (nauseous).  Bloated.  Dizzy.  Fatigued. HOME CARE INSTRUCTIONS  Do not eat or drink anything until the numbing medicine (local anesthetic) has worn off and your gag reflex has returned. You will know that the local anesthetic has worn off when you can swallow comfortably.  Do not drive or operate machinery until directed by your health care provider.  Take medicines only as directed by your health care provider. SEEK MEDICAL CARE IF:   You cannot stop coughing.  You are not urinating at all or less than usual. SEEK IMMEDIATE MEDICAL CARE IF:  You have difficulty swallowing.  You cannot eat or drink.  You have worsening throat or chest pain.  You have dizziness or lightheadedness or you faint.  You have nausea or vomiting.  You have chills.  You have a fever.  You have severe abdominal pain.  You have black, tarry, or bloody stools.   This information is not intended to replace advice given to you by your health care provider. Make sure you discuss any questions you have with your health care provider.   Document Released: 10/24/2012 Document Revised: 11/28/2014 Document Reviewed: 10/24/2012 Elsevier Interactive Patient Education  2016 [employer] Esophageal Dilatation Esophageal dilatation is a procedure to open a blocked or narrowed part of the esophagus. The esophagus is the long tube in your throat that carries food and liquid from your mouth to your stomach. The procedure is also called esophageal dilation.  You may need this procedure if you have a buildup of scar tissue in your esophagus that makes it difficult, painful, or even impossible to swallow. This can be caused by gastroesophageal reflux disease (GERD). In rare cases, people need this procedure because they have cancer of the esophagus or a problem with the way food moves through the esophagus. Sometimes you may need to have another dilatation to enlarge the opening of the esophagus gradually. LET [facility] CARE PROVIDER KNOW ABOUT:   Any allergies you have.  All medicines you are taking, including vitamins, herbs, eye drops, creams, and over-the-counter medicines.  Previous problems you or members of your family have had with the use of anesthetics.  Any blood disorders you have.  Previous surgeries you have had.  Medical conditions you have.  Any antibiotic medicines you are required to take before dental procedures. RISKS AND COMPLICATIONS Generally, this is a safe procedure. However, problems can occur and include:  Bleeding from a tear in the lining of the esophagus.  A hole (perforation) in the esophagus. BEFORE THE PROCEDURE  Do not eat or drink anything after midnight on the night before the procedure or as directed by your health care provider.  Ask your health care provider about changing or stopping your regular medicines. This is especially important if you are taking diabetes  medicines or blood thinners.  Plan to have someone take you home after the procedure. PROCEDURE   You will be given a medicine that makes you relaxed and sleepy (sedative).  A medicine may be sprayed or gargled to numb the back of the throat.  Your  health care provider can use various instruments to do an esophageal dilatation. During the procedure, the instrument used will be placed in your mouth and passed down into your esophagus. Options include:  Simple dilators. This instrument is carefully placed in the esophagus to stretch it.  Guided wire bougies. In this method, a flexible tube (endoscope) is used to insert a wire into the esophagus. The dilator is passed over this wire to enlarge the esophagus. Then the wire is removed.  Balloon dilators. An endoscope with a small balloon at the end is passed down into the esophagus. Inflating the balloon gently stretches the esophagus and opens it up. AFTER THE PROCEDURE  Your blood pressure, heart rate, breathing rate, and blood oxygen level will be monitored often until the medicines you were given have worn off.  Your throat may feel slightly sore and will probably still feel numb. This will improve slowly over time.  You will not be allowed to eat or drink until the throat numbness has resolved.  If this is a same-day procedure, you may be allowed to go home once you have been able to drink, urinate, and sit on the edge of the bed without nausea or dizziness.  If this is a same-day procedure, you should have a friend or family member with you for the next 24 hours after the procedure.   This information is not intended to replace advice given to you by your health care provider. Make sure you discuss any questions you have with your health care provider.   Document Released: 12/29/2005 Document Revised: 11/28/2014 Document Reviewed: 03/19/2014 Elsevier Interactive Patient Education [employer].

## 2015-10-22 NOTE — CV Procedure (Addendum)
EGD PROCEDURE REPORT  PATIENT:  Amanda Osborne  MR#:  [id number] Birthdate:  14-Mar-1951, 64 y.o., female Endoscopist:  Dr. Rogene Houston, MD Referred By:  Dr. Sallee Lange, MD Procedure Date: 10/22/2015  Procedure:   EGD with ED  Indications:  Patient is 63 year old Caucasian female was evaluated 8 weeks ago for 40 pound weight loss nausea vomiting dysphagia and epigastric pain. She was found to have deep ulcer at distal esophagus along with stricture ulcer gastric pouch as well as jejunal ulcers. Biopsy from jejunal ulcers revealed benign etiology. Stricture could not be dilated because of ulceration. Patient was advised to discontinue NSAIDs. She remains on PPI. She's had very few episodes of nausea and vomiting. She remains with dysphagia. She has not lost any more weight.            Informed Consent:  The risks, benefits, alternatives & imponderables which include, but are not limited to, bleeding, infection, perforation, drug reaction and potential missed lesion have been reviewed.  The potential for biopsy, lesion removal, esophageal dilation, etc. have also been discussed.  Questions have been answered.  All parties agreeable.  Please see history & physical in medical record for more information.  Medications:  Demerol 75 mg IV Versed 12 mg IV Cetacaine spray topically for oropharyngeal anesthesia  Description of procedure:  The endoscope was introduced through the mouth and advanced to the second portion of the duodenum without difficulty or limitations. The mucosal surfaces were surveyed very carefully during advancement of the scope and upon withdrawal.  Findings:  Esophagus:  Mucosa of the esophagus was normal. Ulcer at distal esophagus had completely healed. High-grade stricture noted at the junction and scope could not be passed across it. This stricture was dilated as below and examination completed. GEJ:  41 cm Stomach:  Moderate size gastric pouch allowing retroflexion  without any difficulty. Gastric ulcer close to anastomosis at completely healed. Jejunum:  Scarring noted involving jejunal mucosa along with small ulcer which was felt to have healed partially proximal to narrow segment. I was able to pass the scope across this area without difficulty.  Therapeutic/Diagnostic Maneuvers Performed:   Stricture at GE junction was dilated with balloon dilator. Balloon daughter was advanced to the scope. Balloon dilator was positioned across the stricture under direct vision. Balloon was insufflated to a diameter of 15 mm in medially deflated and withdrawn. Once the examination was completed balloon dilator was reintroduced and the stricture was dilated again to 15 mm. This time balloon was Insufflated for 2 minutes. Lumen was deflated and withdrawn. Post dilation was able to pass the scope across stricture without any resistance.  Complications:  None  EBL: Minimal  Impression: Healed esophagitis. High-grade stricture at GE junction was dilated to 15 mm balloon dilator. Moderate size gastric pouch. Gastric ulcer is healed. Near complete healing of jejunal ulcers. Noncritical jejunal narrowing.  Recommendations:  Standard instructions given. Once again patient reminded that she cannot take NSAIDs. Office visit in 8 weeks.  [name],[name]  10/22/2015  12:23 PM  CC: Dr. Sallee Lange, MD & Dr. Rayne Du ref. provider found    Addendum: Type should be operative note instead of CV Procedure.

## 2015-10-26 ENCOUNTER — Encounter (HOSPITAL_COMMUNITY): Payer: Self-pay | Admitting: Internal Medicine

## 2015-11-09 NOTE — Progress Notes (Signed)
Dr. Lance Sell note appreciated.

## 2016-01-05 ENCOUNTER — Ambulatory Visit (INDEPENDENT_AMBULATORY_CARE_PROVIDER_SITE_OTHER): Payer: Medicare Other | Admitting: Internal Medicine

## 2016-01-08 ENCOUNTER — Encounter: Payer: Self-pay | Admitting: Family Medicine

## 2016-01-08 ENCOUNTER — Ambulatory Visit (INDEPENDENT_AMBULATORY_CARE_PROVIDER_SITE_OTHER): Payer: Medicare Other | Admitting: Family Medicine

## 2016-01-08 VITALS — BP 118/78 | Ht 65.75 in | Wt 176.1 lb

## 2016-01-08 DIAGNOSIS — I10 Essential (primary) hypertension: Secondary | ICD-10-CM

## 2016-01-08 DIAGNOSIS — R739 Hyperglycemia, unspecified: Secondary | ICD-10-CM

## 2016-01-08 DIAGNOSIS — G8929 Other chronic pain: Secondary | ICD-10-CM

## 2016-01-08 DIAGNOSIS — M549 Dorsalgia, unspecified: Secondary | ICD-10-CM | POA: Diagnosis not present

## 2016-01-08 DIAGNOSIS — Z79899 Other long term (current) drug therapy: Secondary | ICD-10-CM

## 2016-01-08 DIAGNOSIS — E785 Hyperlipidemia, unspecified: Secondary | ICD-10-CM | POA: Diagnosis not present

## 2016-01-08 DIAGNOSIS — M810 Age-related osteoporosis without current pathological fracture: Secondary | ICD-10-CM | POA: Diagnosis not present

## 2016-01-08 MED ORDER — OXYCODONE HCL 5 MG PO TABS: 5.0000 mg | ORAL_TABLET | ORAL | Status: DC | PRN

## 2016-01-08 MED ORDER — TRIAMCINOLONE ACETONIDE 0.1 % EX CREA: 1.0000 "application " | TOPICAL_CREAM | Freq: Two times a day (BID) | CUTANEOUS | Status: DC

## 2016-01-08 MED ORDER — TIZANIDINE HCL 4 MG PO TABS: ORAL_TABLET | ORAL | Status: DC

## 2016-01-08 MED ORDER — ALPRAZOLAM 0.5 MG PO TABS: ORAL_TABLET | ORAL | Status: DC

## 2016-01-08 MED ORDER — IBUPROFEN 200 MG PO TABS: ORAL_TABLET | ORAL | Status: DC

## 2016-01-08 NOTE — Progress Notes (Signed)
   Subjective:    Patient ID: Amanda Osborne, female    DOB: [birth year], 65 y.o.   MRN: [id number]  HPI This patient was seen today for chronic pain  The medication list was reviewed and updated.   -Compliance with medication: yes  - Number patient states they take daily: Takes every 3 hours starting at 6 in the morning   -when was the last dose patient took? Last dose at 12pm today  The patient was advised the importance of maintaining medication and not using illegal substances with these.  Refills needed: yes  The patient was educated that we can provide 3 monthly scripts for their medication, it is their responsibility to follow the instructions.  Side effects or complications from medications: None  Patient is aware that pain medications are meant to minimize the severity of the pain to allow their pain levels to improve to allow for better function. They are aware of that pain medications cannot totally remove their pain.  Due for UDT ( at least once per year) : 05/01/2015  Patient would also like to discuss bilateral hip pain, and skin condition. Long discussion held regarding her hip pain see discussion below intermittent hip pain and discomfort occasionally radiates into the back History reflux takes PPI History of gastric ulcers she states it was because she was abusing aspirin Cholesterol she takes her medicine she watches her diet Blood pressure issues she takes her medicine she watches her diet She denies abusing her pain medicines.     Review of Systems She denies any chest tightness pressure pain shortness breath nausea vomiting she relates pain in her neck.    Objective:   Physical Exam  Her lungs are clear hearts regular pulse normal extremities no edema skin warm dry subjective discomfort in her neck as well as her lower back She has some areas on her face that she is worried about. She states that she would like to see Dr. [place] Crane. She relates she has  intermittent bilateral hip pain but it comes and goes. Patient has good range of motion of the hips negative straight leg raise    25 minutes was spent with the patient. Greater than half the time was spent in discussion and answering questions and counseling regarding the issues that the patient came in for today.  Assessment & Plan:  Osteoporosis issues cost-she has osteoporosis she is not treating it she cannot tolerate Fosamax we will look into see if one of the specialists office does injections for osteoporosis  Bilateral hip pain may use her pain medicines She has a lot of arthralgias. She states the reason she got small bowel ulcers was because she was abusing aspirin. She feels she can tolerate ibuprofen she understands there is a risk of ulcers and bleeding she states she is willing to take that risk she will use ibuprofen 200 mg tablets her take 2 tablets 3 times daily  Chronic pain-her prescriptions are given she is compliant with her medicines she will follow-up here if ongoing troubles otherwise recheck 3 months  She has chronic anxiety issues and stress issues-she may uses Xanax but she is to be aware that it can cause drowsiness not to take it at bedtime  Continue PPI Continue statin check lab work History of electrolyte dysfunction as well as taking lisinopril check metabolic 7

## 2016-01-18 ENCOUNTER — Encounter: Payer: Self-pay | Admitting: Family Medicine

## 2016-02-12 ENCOUNTER — Telehealth: Payer: Self-pay | Admitting: Family Medicine

## 2016-02-12 DIAGNOSIS — R21 Rash and other nonspecific skin eruption: Secondary | ICD-10-CM

## 2016-02-12 NOTE — Telephone Encounter (Signed)
Patient called to check on dermatology referral from last office visit, no referral was ever put in for dermatology Spots on her face that she's worried about was briefly mentioned during last OV In message patient states she only wants to see Dr. [place] Crane, not his female partner   Madaline Brilliant to refer?  Please advise If ok to refer, please initiate in system so that I may process

## 2016-02-12 NOTE — Telephone Encounter (Signed)
plz refer to dr hall specific

## 2016-02-12 NOTE — Telephone Encounter (Signed)
Order for referral put in.  

## 2016-02-29 DIAGNOSIS — L219 Seborrheic dermatitis, unspecified: Secondary | ICD-10-CM | POA: Diagnosis not present

## 2016-02-29 DIAGNOSIS — D225 Melanocytic nevi of trunk: Secondary | ICD-10-CM | POA: Diagnosis not present

## 2016-03-17 ENCOUNTER — Ambulatory Visit (HOSPITAL_COMMUNITY)
Admission: RE | Admit: 2016-03-17 | Discharge: 2016-03-17 | Disposition: A | Discharge status: Home / Self Care | Payer: Medicare Other | Source: Ambulatory Visit | Attending: Family Medicine | Admitting: Family Medicine

## 2016-03-17 ENCOUNTER — Ambulatory Visit (INDEPENDENT_AMBULATORY_CARE_PROVIDER_SITE_OTHER): Payer: Medicare Other | Admitting: Family Medicine

## 2016-03-17 ENCOUNTER — Encounter: Payer: Self-pay | Admitting: Family Medicine

## 2016-03-17 VITALS — BP 130/80 | Ht 66.5 in | Wt 181.0 lb

## 2016-03-17 DIAGNOSIS — M47816 Spondylosis without myelopathy or radiculopathy, lumbar region: Secondary | ICD-10-CM | POA: Diagnosis not present

## 2016-03-17 DIAGNOSIS — M545 Low back pain: Secondary | ICD-10-CM

## 2016-03-17 DIAGNOSIS — M4186 Other forms of scoliosis, lumbar region: Secondary | ICD-10-CM | POA: Diagnosis not present

## 2016-03-17 NOTE — Progress Notes (Signed)
   Subjective:    Patient ID: Amanda Osborne, female    DOB: 12-Aug-1951, 65 y.o.   MRN: [id number]  HPIstrong throbbing pain in lower buttock. Painful to sit on anything hard. Started in January. Tried steroid. Improved. Pain started again end of march off and  On. Pain worse over the past 5 days ago. Takes oxycodone and ibuprofen. Not much relief.     Review of Systems    patient relates severe pain only gets relief with bending forward or laying on her side she relates if she wakes up on her back she has severe pain radiates into the legs along with some restless legs denies any injury that could've caused it. Objective:   Physical Exam Lungs clear heart regular low back moderate tenderness       Assessment & Plan:  Lumbar spine pain radiates to the but I can into the legs with some level of restlessness of the legs Patient states several years ago she had medication prescribed which help restless legs we will look into her paper chart X-rays lumbar spine recommended Continue pain medication patient does not want to increase the dose Follow-up in several weeks if ongoing troubles may need MRI or further testing

## 2016-03-18 NOTE — Addendum Note (Signed)
Addended by: Ofilia Neas R on: 03/18/2016 10:21 AM   Modules accepted: Orders

## 2016-03-20 ENCOUNTER — Telehealth: Payer: Self-pay | Admitting: Family Medicine

## 2016-03-20 NOTE — Telephone Encounter (Signed)
Nurse's-the patient was in the other day. She asked that I look in her medical record to see what medication was used to treat restless legs many years ago. The medication was Klonopin. Unfortunately currently the patient is taking Xanax twice a day so therefore we cannot use Klonopin causes both of these are nerve medications and would interact together. If the patient is interested in treating the restless legs we would have to use a medication such as Requip. We would have to also evaluate whether or not it would get along with her other medications if she is interested in this.

## 2016-03-21 NOTE — Telephone Encounter (Signed)
Spoke with patient and informed her per Dr.Scott Luking-She asked that I look in her medical record to see what medication was used to treat restless legs many years ago. The medication was Klonopin. Unfortunately currently the patient is taking Xanax twice a day so therefore we cannot use Klonopin causes both of these are nerve medications and would interact together. If the patient is interested in treating the restless legs we would have to use a medication such as Requip. We would have to also evaluate whether or not it would get along with her other medications if she is interested in this.Patient verbalized understanding and stated that she does not currently have any problems with restless leg syndrome but will discuss at her next office visit coming up on 03/28/16

## 2016-03-24 ENCOUNTER — Ambulatory Visit (HOSPITAL_COMMUNITY)
Admission: RE | Admit: 2016-03-24 | Discharge: 2016-03-24 | Disposition: A | Discharge status: Home / Self Care | Payer: Medicare Other | Source: Ambulatory Visit | Attending: Family Medicine | Admitting: Family Medicine

## 2016-03-24 DIAGNOSIS — M5137 Other intervertebral disc degeneration, lumbosacral region: Secondary | ICD-10-CM | POA: Diagnosis not present

## 2016-03-24 DIAGNOSIS — M47816 Spondylosis without myelopathy or radiculopathy, lumbar region: Secondary | ICD-10-CM | POA: Diagnosis not present

## 2016-03-24 DIAGNOSIS — R739 Hyperglycemia, unspecified: Secondary | ICD-10-CM | POA: Diagnosis not present

## 2016-03-24 DIAGNOSIS — M5126 Other intervertebral disc displacement, lumbar region: Secondary | ICD-10-CM | POA: Diagnosis not present

## 2016-03-24 DIAGNOSIS — M545 Low back pain: Secondary | ICD-10-CM | POA: Insufficient documentation

## 2016-03-24 DIAGNOSIS — E785 Hyperlipidemia, unspecified: Secondary | ICD-10-CM | POA: Diagnosis not present

## 2016-03-24 DIAGNOSIS — M5136 Other intervertebral disc degeneration, lumbar region: Secondary | ICD-10-CM | POA: Diagnosis not present

## 2016-03-24 DIAGNOSIS — Z79899 Other long term (current) drug therapy: Secondary | ICD-10-CM | POA: Diagnosis not present

## 2016-03-24 DIAGNOSIS — I10 Essential (primary) hypertension: Secondary | ICD-10-CM | POA: Diagnosis not present

## 2016-03-25 LAB — BASIC METABOLIC PANEL
BUN/Creatinine Ratio: 31 — ABNORMAL HIGH (ref 12–28)
BUN: 15 mg/dL (ref 8–27)
CHLORIDE: 98 mmol/L (ref 96–106)
CO2: 26 mmol/L (ref 18–29)
Calcium: 9 mg/dL (ref 8.7–10.3)
Creatinine, Ser: 0.49 mg/dL — ABNORMAL LOW (ref 0.57–1.00)
GFR, EST AFRICAN AMERICAN: 118 mL/min/{1.73_m2} (ref >59)
GFR, EST NON AFRICAN AMERICAN: 103 mL/min/{1.73_m2} (ref >59)
Glucose: 89 mg/dL (ref 65–99)
POTASSIUM: 3.9 mmol/L (ref 3.5–5.2)
SODIUM: 139 mmol/L (ref 134–144)

## 2016-03-25 LAB — HEPATIC FUNCTION PANEL
ALBUMIN: 3.8 g/dL (ref 3.6–4.8)
ALK PHOS: 46 IU/L (ref 39–117)
ALT: 14 IU/L (ref 0–32)
AST: 18 IU/L (ref 0–40)
BILIRUBIN TOTAL: 0.2 mg/dL (ref 0.0–1.2)
Bilirubin, Direct: 0.08 mg/dL (ref 0.00–0.40)
Total Protein: 5.7 g/dL — ABNORMAL LOW (ref 6.0–8.5)

## 2016-03-25 LAB — LIPID PANEL
CHOLESTEROL TOTAL: 166 mg/dL (ref 100–199)
Chol/HDL Ratio: 3.1 ratio units (ref 0.0–4.4)
HDL: 53 mg/dL (ref >39)
LDL CALC: 101 mg/dL — AB (ref 0–99)
Triglycerides: 62 mg/dL (ref 0–149)
VLDL Cholesterol Cal: 12 mg/dL (ref 5–40)

## 2016-03-25 NOTE — Addendum Note (Signed)
Addended by: Launa Grill on: 03/25/2016 09:29 AM   Modules accepted: Orders

## 2016-03-28 ENCOUNTER — Encounter: Payer: Self-pay | Admitting: Family Medicine

## 2016-03-28 ENCOUNTER — Ambulatory Visit (INDEPENDENT_AMBULATORY_CARE_PROVIDER_SITE_OTHER): Payer: Medicare Other | Admitting: Family Medicine

## 2016-03-28 VITALS — BP 120/74 | Ht 66.5 in | Wt 179.2 lb

## 2016-03-28 DIAGNOSIS — M5440 Lumbago with sciatica, unspecified side: Secondary | ICD-10-CM

## 2016-03-28 MED ORDER — OXYCODONE HCL 5 MG PO TABS: 5.0000 mg | ORAL_TABLET | ORAL | Status: DC | PRN

## 2016-03-28 NOTE — Progress Notes (Signed)
   Subjective:    Patient ID: Amanda Osborne, female    DOB: [birth year], 65 y.o.   MRN: [id number]  Back Pain This is a recurrent problem. The pain is present in the lumbar spine.   Patient in today to discuss MRI of Lumbar spine.   States no other concerns this visit.   Review of Systems  Musculoskeletal: Positive for back pain.   Patient denies any rectal bleeding constipation. Relates low back pain some radiation into the legs.    Objective:   Physical Exam  No physical exam was done as part of today's visit. 15 minutes spent was in consultation regarding the MRI      Assessment & Plan:  MRI results were reviewed. But  benign lesions reviewed as well.  Patient will also be seen neurosurgery in the near future she is seen Dr. Ellene Route before this is for further evaluation of her back and whether or not injections or surgery would be helpful.

## 2016-03-29 ENCOUNTER — Encounter: Payer: Self-pay | Admitting: Family Medicine

## 2016-04-06 ENCOUNTER — Ambulatory Visit: Payer: Medicare Other | Admitting: Family Medicine

## 2016-04-28 DIAGNOSIS — M5432 Sciatica, left side: Secondary | ICD-10-CM | POA: Diagnosis not present

## 2016-05-31 ENCOUNTER — Other Ambulatory Visit: Payer: Self-pay | Admitting: Family Medicine

## 2016-06-29 ENCOUNTER — Encounter: Payer: Self-pay | Admitting: Family Medicine

## 2016-06-29 ENCOUNTER — Ambulatory Visit (INDEPENDENT_AMBULATORY_CARE_PROVIDER_SITE_OTHER): Payer: Medicare Other | Admitting: Family Medicine

## 2016-06-29 VITALS — BP 114/80 | Ht 66.5 in | Wt 187.0 lb

## 2016-06-29 DIAGNOSIS — F411 Generalized anxiety disorder: Secondary | ICD-10-CM | POA: Diagnosis not present

## 2016-06-29 DIAGNOSIS — M549 Dorsalgia, unspecified: Secondary | ICD-10-CM | POA: Diagnosis not present

## 2016-06-29 DIAGNOSIS — G8929 Other chronic pain: Secondary | ICD-10-CM | POA: Diagnosis not present

## 2016-06-29 MED ORDER — OXYCODONE HCL 5 MG PO TABS: 5.0000 mg | ORAL_TABLET | ORAL | 0 refills | Status: DC | PRN

## 2016-06-29 MED ORDER — ALPRAZOLAM 0.5 MG PO TABS: ORAL_TABLET | ORAL | 5 refills | Status: DC

## 2016-06-29 NOTE — Progress Notes (Signed)
   Subjective:    Patient ID: ZACHERY LORIG, female    DOB: 1951-05-22, 65 y.o.   MRN: [id number]  HPI This patient was seen today for chronic pain  The medication list was reviewed and updated.   -Compliance with medication: yes  - Number patient states they take daily: 6 a day  -when was the last dose patient took? today  The patient was advised the importance of maintaining medication and not using illegal substances with these.  Refills needed: yes  The patient was educated that we can provide 3 monthly scripts for their medication, it is their responsibility to follow the instructions.  Side effects or complications from medications: none  Patient is aware that pain medications are meant to minimize the severity of the pain to allow their pain levels to improve to allow for better function. They are aware of that pain medications cannot totally remove their pain.  Due for UDT ( at least once per year) :  Patient would like to discuss recent visit with Dr Ellene Route. Long discussion held with patient regarding this. They do not feel surgery would be helpful. They did recommend physical therapy but patient cannot afford this. They recommended for her to stay on her medicines  She does relate moderate amount of anxiety. She denies being depressed. She is tried antidepressants in the past but had side effects with him. She is currently on Xanax 0.5 mg twice a day ongoing she was on a lot more than that she is tolerating this lower dose well      Review of Systems  Constitutional: Negative for activity change, appetite change and fatigue.  Gastrointestinal: Negative for abdominal pain.  Musculoskeletal: Positive for arthralgias, back pain and neck pain.  Neurological: Negative for headaches.  Psychiatric/Behavioral: Negative for behavioral problems.       Objective:   Physical Exam  Constitutional: She appears well-nourished. No distress.  Cardiovascular: Normal rate,  regular rhythm and normal heart sounds.   No murmur heard. Pulmonary/Chest: Effort normal and breath sounds normal. No respiratory distress.  Musculoskeletal: She exhibits tenderness. She exhibits no edema.  Lymphadenopathy:    She has no cervical adenopathy.  Neurological: She is alert. She exhibits normal muscle tone.  Psychiatric: Her behavior is normal.  Vitals reviewed.         Assessment & Plan:  This patient has severe anxiety related issues with panic attacks has been under medications for years. We have reduced her down to a lower dose currently I do not feel were B elbow to get her lower than this she denies any side effects from the medicine  Chronic pain patient has chronic pain in her neck and lower back patient states it harder code own does not work. She does relate that oxycodone does work she denies a causing any drowsiness. 3 prescriptions written. Urine drug screen later this year  Patient under significant stress taking care of her dad this causes significant distress to the patient but she is handling it the best she can

## 2016-07-24 ENCOUNTER — Other Ambulatory Visit: Payer: Self-pay | Admitting: Family Medicine

## 2016-07-30 ENCOUNTER — Other Ambulatory Visit: Payer: Self-pay | Admitting: Family Medicine

## 2016-08-01 NOTE — Telephone Encounter (Signed)
May refill medication 1

## 2016-08-04 ENCOUNTER — Other Ambulatory Visit: Payer: Self-pay | Admitting: Family Medicine

## 2016-08-04 NOTE — Telephone Encounter (Signed)
Medication was discontinued by GI specialist. Would you like to refill?

## 2016-08-10 ENCOUNTER — Other Ambulatory Visit: Payer: Self-pay | Admitting: Family Medicine

## 2016-09-29 ENCOUNTER — Ambulatory Visit: Payer: Medicare Other | Admitting: Family Medicine

## 2016-10-03 ENCOUNTER — Ambulatory Visit (INDEPENDENT_AMBULATORY_CARE_PROVIDER_SITE_OTHER): Payer: Medicare Other | Admitting: Family Medicine

## 2016-10-03 ENCOUNTER — Encounter: Payer: Self-pay | Admitting: Family Medicine

## 2016-10-03 VITALS — BP 112/68 | Ht 66.5 in | Wt 184.1 lb

## 2016-10-03 DIAGNOSIS — B9689 Other specified bacterial agents as the cause of diseases classified elsewhere: Secondary | ICD-10-CM | POA: Diagnosis not present

## 2016-10-03 DIAGNOSIS — K219 Gastro-esophageal reflux disease without esophagitis: Secondary | ICD-10-CM | POA: Diagnosis not present

## 2016-10-03 DIAGNOSIS — J019 Acute sinusitis, unspecified: Secondary | ICD-10-CM | POA: Diagnosis not present

## 2016-10-03 DIAGNOSIS — Z79891 Long term (current) use of opiate analgesic: Secondary | ICD-10-CM | POA: Diagnosis not present

## 2016-10-03 DIAGNOSIS — I10 Essential (primary) hypertension: Secondary | ICD-10-CM | POA: Diagnosis not present

## 2016-10-03 DIAGNOSIS — G894 Chronic pain syndrome: Secondary | ICD-10-CM | POA: Diagnosis not present

## 2016-10-03 MED ORDER — AMOXICILLIN 500 MG PO CAPS: 500.0000 mg | ORAL_CAPSULE | Freq: Three times a day (TID) | ORAL | 0 refills | Status: DC

## 2016-10-03 MED ORDER — AMOXICILLIN 500 MG PO TABS: 500.0000 mg | ORAL_TABLET | Freq: Three times a day (TID) | ORAL | 0 refills | Status: DC

## 2016-10-03 MED ORDER — OXYCODONE HCL 5 MG PO TABS: 5.0000 mg | ORAL_TABLET | ORAL | 0 refills | Status: DC | PRN

## 2016-10-03 NOTE — Progress Notes (Signed)
   Subjective:    Patient ID: Amanda Osborne, female    DOB: 1951-04-03, 65 y.o.   MRN: [id number]  HPI This patient was seen today for chronic pain  The medication list was reviewed and updated.   -Compliance with medication: yes  - Number patient states they take daily: 6 daily   -when was the last dose patient took: today  The patient was advised the importance of maintaining medication and not using illegal substances with these.  Refills needed: yes  The patient was educated that we can provide 3 monthly scripts for their medication, it is their responsibility to follow the instructions.  Side effects or complications from medications: none  Patient is aware that pain medications are meant to minimize the severity of the pain to allow their pain levels to improve to allow for better function. They are aware of that pain medications cannot totally remove their pain.  Due for UDT ( at least once per year) : yes  Takes her chol med Takes bp meds Follows diet Stress fair Some issues with reflux Uses Dexilant and pepcid ac   Patient has a sinus infection.sx present for 6 weeks conges/cough/head pressure no feverPatient requests antibiotics for the sinus infection   Due for colonoscopy    She also has some abdominal discomfort also. Patient relates fair amount of reflux issues. States she thinks is related to her previous surgery. States eventually she will need colonoscopy but she does not want to do until spring time.    Review of Systems  Constitutional: Negative for activity change, appetite change and fever.  HENT: Positive for congestion and rhinorrhea. Negative for ear pain.   Eyes: Negative for discharge.  Respiratory: Positive for cough. Negative for shortness of breath and wheezing.   Cardiovascular: Negative for chest pain.  Gastrointestinal: Negative for abdominal pain and vomiting.  Neurological: Negative for weakness.  Psychiatric/Behavioral:  Negative for confusion.       Objective:   Physical Exam  Constitutional: She appears well-developed and well-nourished. No distress.  HENT:  Head: Normocephalic.  Nose: Nose normal.  Mouth/Throat: Oropharynx is clear and moist. No oropharyngeal exudate.  Neck: Neck supple.  Cardiovascular: Normal rate, regular rhythm and normal heart sounds.   No murmur heard. Pulmonary/Chest: Effort normal and breath sounds normal. She has no wheezes.  Musculoskeletal: She exhibits no edema.  Lymphadenopathy:    She has no cervical adenopathy.  Neurological: She is alert.  Skin: Skin is warm and dry.  Psychiatric: Her behavior is normal.  Nursing note and vitals reviewed.   Colon deferred      Assessment & Plan:  Hypertension decent control continue current measures Reflux she feels like when they did her surgery they stapled off all of her stomach she states she has frequent reflux issues but the medicine does help she denies any dysphagia She has chronic pain the pain medicine keeps her functional. 3 prescriptions written today she denies abusing it Went over the particulars regarding a pain contract. Patient does take her cholesterol medicine previous labs reviewed with patient Patient takes her blood pressure medicine under good control. Patient defers on colonoscopy until spring time Sinusitis amoxicillin 500 3 times a day 10 days

## 2016-10-06 ENCOUNTER — Other Ambulatory Visit: Payer: Self-pay | Admitting: Family Medicine

## 2016-10-10 LAB — TOXASSURE SELECT 13 (MW), URINE

## 2016-11-09 ENCOUNTER — Other Ambulatory Visit: Payer: Self-pay | Admitting: Family Medicine

## 2016-11-25 ENCOUNTER — Other Ambulatory Visit: Payer: Self-pay | Admitting: Family Medicine

## 2016-12-30 ENCOUNTER — Encounter: Payer: Self-pay | Admitting: Family Medicine

## 2016-12-30 ENCOUNTER — Ambulatory Visit (INDEPENDENT_AMBULATORY_CARE_PROVIDER_SITE_OTHER): Payer: Medicare Other | Admitting: Family Medicine

## 2016-12-30 VITALS — BP 130/70 | Temp 98.4°F | Ht 66.5 in | Wt 186.4 lb

## 2016-12-30 DIAGNOSIS — J019 Acute sinusitis, unspecified: Secondary | ICD-10-CM

## 2016-12-30 DIAGNOSIS — Z23 Encounter for immunization: Secondary | ICD-10-CM | POA: Diagnosis not present

## 2016-12-30 DIAGNOSIS — I10 Essential (primary) hypertension: Secondary | ICD-10-CM

## 2016-12-30 DIAGNOSIS — Z79899 Other long term (current) drug therapy: Secondary | ICD-10-CM | POA: Diagnosis not present

## 2016-12-30 DIAGNOSIS — B9689 Other specified bacterial agents as the cause of diseases classified elsewhere: Secondary | ICD-10-CM

## 2016-12-30 DIAGNOSIS — G894 Chronic pain syndrome: Secondary | ICD-10-CM | POA: Diagnosis not present

## 2016-12-30 DIAGNOSIS — E785 Hyperlipidemia, unspecified: Secondary | ICD-10-CM | POA: Diagnosis not present

## 2016-12-30 MED ORDER — DOXYCYCLINE HYCLATE 100 MG PO CAPS: 100.0000 mg | ORAL_CAPSULE | Freq: Two times a day (BID) | ORAL | 0 refills | Status: DC

## 2016-12-30 MED ORDER — ALPRAZOLAM 0.5 MG PO TABS: ORAL_TABLET | ORAL | 5 refills | Status: DC

## 2016-12-30 MED ORDER — OXYCODONE HCL 5 MG PO TABS: 5.0000 mg | ORAL_TABLET | ORAL | 0 refills | Status: DC | PRN

## 2016-12-30 NOTE — Progress Notes (Signed)
   Subjective:    Patient ID: Amanda Osborne, female    DOB: Dec 31, 1950, 66 y.o.   MRN: [id number]  HPI This patient was seen today for chronic pain  The medication list was reviewed and updated.   -Compliance with medication: yes  - Number patient states they take daily: 6 daily  -when was the last dose patient took: 6 am   The patient was advised the importance of maintaining medication and not using illegal substances with these.  Refills needed: yes  The patient was educated that we can provide 3 monthly scripts for their medication, it is their responsibility to follow the instructions.  Side effects or complications from medications: none  Patient is aware that pain medications are meant to minimize the severity of the pain to allow their pain levels to improve to allow for better function. They are aware of that pain medications cannot totally remove their pain.  Due for UDT ( at least once per year): utd   Patient states that she is having a lot of sinus issues. Congestion and cough noted. Onset several days ago.  Patient relates reflux under good control Takes her blood pressure medicine denies any problems watch his salt content Takes her cholesterol medicine tolerates it previous labs reviewed with patient Patient had moderate sinus symptoms head congestion drainage coughing   Review of Systems  Constitutional: Negative for activity change, appetite change and fever.  HENT: Positive for congestion and rhinorrhea. Negative for ear pain.   Eyes: Negative for discharge.  Respiratory: Positive for cough. Negative for shortness of breath and wheezing.   Cardiovascular: Negative for chest pain.  Gastrointestinal: Negative for abdominal pain and vomiting.  Neurological: Negative for weakness.  Psychiatric/Behavioral: Negative for confusion.       Objective:   Physical Exam  Constitutional: She appears well-developed and well-nourished. No distress.  HENT:    Head: Normocephalic.  Nose: Nose normal.  Mouth/Throat: Oropharynx is clear and moist. No oropharyngeal exudate.  Neck: Neck supple.  Cardiovascular: Normal rate, regular rhythm and normal heart sounds.   No murmur heard. Pulmonary/Chest: Effort normal and breath sounds normal. She has no wheezes.  Musculoskeletal: She exhibits no edema.  Lymphadenopathy:    She has no cervical adenopathy.  Neurological: She is alert.  Skin: Skin is warm and dry.  Psychiatric: Her behavior is normal.  Nursing note and vitals reviewed.         Assessment & Plan:  Sinusitis-antibiotic prescribed warning signs discussed follow-up if problems  Chronic pain 3 prescriptions given The patient was seen today as part of a comprehensive visit regarding pain control. Patient's compliance with the medication as well as discussion regarding effectiveness was completed. Prescriptions were written. Patient was advised to follow-up in 3 months. The patient was assessed for any signs of severe side effects. The patient was advised to take the medicine as directed and to report to [place] if any side effect issues.   Hyperlipidemia previous labs reviewed Sore ordered watch diet-continue medication  Blood pressure good control continue current measures watch diet  Pneumonia vaccine given today  Follow-up 3 months

## 2017-01-02 ENCOUNTER — Ambulatory Visit: Payer: Medicare Other | Admitting: Family Medicine

## 2017-01-03 ENCOUNTER — Other Ambulatory Visit: Payer: Self-pay | Admitting: Family Medicine

## 2017-01-03 ENCOUNTER — Telehealth: Payer: Self-pay | Admitting: Family Medicine

## 2017-01-03 MED ORDER — PREDNISONE 20 MG PO TABS: ORAL_TABLET | ORAL | 0 refills | Status: DC

## 2017-01-03 NOTE — Telephone Encounter (Signed)
The patient was present with her dad who was getting a checkup we had seen the patient previously within the past week for respiratory illness that was most likely sinusitis with bronchitis she was put on doxycycline she states she is been still having some moderate amount of coughing on examination it was determined that the patient in fact had some bilateral expiratory wheezes but no rails her O2 sats ration was 95% she was not respiratory distress we will go ahead with a prednisone taper over the course of the next 7 days I wrote out on a piece paper how for her to take it she is to follow-up if not dramatically better within 3-4 days she will finish the doxycycline

## 2017-01-21 ENCOUNTER — Other Ambulatory Visit: Payer: Self-pay | Admitting: Nurse Practitioner

## 2017-02-21 ENCOUNTER — Other Ambulatory Visit: Payer: Self-pay | Admitting: Family Medicine

## 2017-03-06 ENCOUNTER — Encounter: Payer: Self-pay | Admitting: Family Medicine

## 2017-03-06 NOTE — Telephone Encounter (Signed)
ERROR

## 2017-03-29 ENCOUNTER — Ambulatory Visit: Payer: Medicare Other | Admitting: Family Medicine

## 2017-03-30 ENCOUNTER — Encounter: Payer: Self-pay | Admitting: Family Medicine

## 2017-03-30 ENCOUNTER — Ambulatory Visit (INDEPENDENT_AMBULATORY_CARE_PROVIDER_SITE_OTHER): Payer: Medicare Other | Admitting: Family Medicine

## 2017-03-30 VITALS — BP 112/68 | Ht 66.5 in | Wt 184.0 lb

## 2017-03-30 DIAGNOSIS — R5383 Other fatigue: Secondary | ICD-10-CM | POA: Diagnosis not present

## 2017-03-30 DIAGNOSIS — G894 Chronic pain syndrome: Secondary | ICD-10-CM

## 2017-03-30 DIAGNOSIS — D508 Other iron deficiency anemias: Secondary | ICD-10-CM

## 2017-03-30 DIAGNOSIS — E785 Hyperlipidemia, unspecified: Secondary | ICD-10-CM | POA: Diagnosis not present

## 2017-03-30 DIAGNOSIS — K219 Gastro-esophageal reflux disease without esophagitis: Secondary | ICD-10-CM

## 2017-03-30 MED ORDER — OXYCODONE HCL 5 MG PO TABS: 5.0000 mg | ORAL_TABLET | ORAL | 0 refills | Status: DC | PRN

## 2017-03-30 NOTE — Progress Notes (Signed)
   Subjective:    Patient ID: Amanda Osborne, female    DOB: [birth year], 66 y.o.   MRN: [id number]  HPI This patient was seen today for chronic pain. Takes for joint pain. Has pain all overThis this gives a more official stamp  The medication list was reviewed and updated.   -Compliance with medication: yes  - Number patient states they take daily: takes 6 a day.   -when was the last dose patient took? today  The patient was advised the importance of maintaining medication and not using illegal substances with these.  Refills needed: yes  The patient was educated that we can provide 3 monthly scripts for their medication, it is their responsibility to follow the instructions.  Side effects or complications from medications: none  Patient is aware that pain medications are meant to minimize the severity of the pain to allow their pain levels to improve to allow for better function. They are aware of that pain medications cannot totally remove their pain.  Due for UDT ( at least once per year) : last one November 2017  Wants to add test to her bloodwork. Wants iron and thyroid check. Pt states she cannot stay awake.   She relates difficulty with feeling fatigued and tired that she attributes to either thyroid or low iron. She is had a gastric bypass and in the past and she is had problems with her iron being low. She does try to eat relatively healthy she does not do much in way of exercise she denies sweats chills    Review of Systems  Constitutional: Negative for activity change and appetite change.  Gastrointestinal: Negative for abdominal pain and vomiting.  Neurological: Negative for weakness.  Psychiatric/Behavioral: Negative for confusion.       Objective:   Physical Exam  Constitutional: She appears well-nourished. No distress.  Cardiovascular: Normal rate, regular rhythm and normal heart sounds.   No murmur heard. Pulmonary/Chest: Effort normal and breath sounds  normal. No respiratory distress.  Musculoskeletal: She exhibits no edema.  Lymphadenopathy:    She has no cervical adenopathy.  Neurological: She is alert. She exhibits normal muscle tone.  Psychiatric: Her behavior is normal.  Vitals reviewed.    25 minutes was spent with the patient. Greater than half the time was spent in discussion and answering questions and counseling regarding the issues that the patient came in for today.      Assessment & Plan:  Chronic back and neck pain 3 prescriptions of pain medicine written patient denies abusing it drug registry was checked  Significant fatigue check thyroid function  History are deficient anemia with gastric bypass check lab work  Hyperlipidemia previous lab is reviewed continue current measures  Reflux good control  Follow-up in 3 months

## 2017-04-17 ENCOUNTER — Other Ambulatory Visit: Payer: Self-pay | Admitting: Family Medicine

## 2017-04-18 NOTE — Telephone Encounter (Signed)
Last seen 03/30/17

## 2017-05-07 ENCOUNTER — Other Ambulatory Visit: Payer: Self-pay | Admitting: Family Medicine

## 2017-06-26 DIAGNOSIS — D508 Other iron deficiency anemias: Secondary | ICD-10-CM | POA: Diagnosis not present

## 2017-06-26 DIAGNOSIS — E785 Hyperlipidemia, unspecified: Secondary | ICD-10-CM | POA: Diagnosis not present

## 2017-06-26 DIAGNOSIS — R5383 Other fatigue: Secondary | ICD-10-CM | POA: Diagnosis not present

## 2017-06-27 LAB — HEPATIC FUNCTION PANEL
ALBUMIN: 3.9 g/dL (ref 3.6–4.8)
ALK PHOS: 58 IU/L (ref 39–117)
ALT: 13 IU/L (ref 0–32)
AST: 21 IU/L (ref 0–40)
BILIRUBIN, DIRECT: 0.04 mg/dL (ref 0.00–0.40)
Bilirubin Total: <0.2 mg/dL (ref 0.0–1.2)
Total Protein: 5.8 g/dL — ABNORMAL LOW (ref 6.0–8.5)

## 2017-06-27 LAB — TSH: TSH: 4.68 u[IU]/mL — AB (ref 0.450–4.500)

## 2017-06-27 LAB — IRON AND TIBC
IRON SATURATION: 8 % — AB (ref 15–55)
Iron: 30 ug/dL (ref 27–139)
Total Iron Binding Capacity: 369 ug/dL (ref 250–450)
UIBC: 339 ug/dL (ref 118–369)

## 2017-06-27 LAB — BASIC METABOLIC PANEL
BUN/Creatinine Ratio: 30 — ABNORMAL HIGH (ref 12–28)
BUN: 14 mg/dL (ref 8–27)
CO2: 24 mmol/L (ref 20–29)
CREATININE: 0.47 mg/dL — AB (ref 0.57–1.00)
Calcium: 8.7 mg/dL (ref 8.7–10.3)
Chloride: 102 mmol/L (ref 96–106)
GFR, EST AFRICAN AMERICAN: 119 mL/min/{1.73_m2} (ref >59)
GFR, EST NON AFRICAN AMERICAN: 103 mL/min/{1.73_m2} (ref >59)
Glucose: 87 mg/dL (ref 65–99)
POTASSIUM: 4.2 mmol/L (ref 3.5–5.2)
SODIUM: 141 mmol/L (ref 134–144)

## 2017-06-27 LAB — FERRITIN: Ferritin: 14 ng/mL — ABNORMAL LOW (ref 15–150)

## 2017-06-27 LAB — LIPID PANEL
CHOLESTEROL TOTAL: 170 mg/dL (ref 100–199)
Chol/HDL Ratio: 4 ratio (ref 0.0–4.4)
HDL: 43 mg/dL (ref >39)
LDL Calculated: 102 mg/dL — ABNORMAL HIGH (ref 0–99)
Triglycerides: 127 mg/dL (ref 0–149)
VLDL Cholesterol Cal: 25 mg/dL (ref 5–40)

## 2017-06-27 LAB — T4, FREE: FREE T4: 1.09 ng/dL (ref 0.82–1.77)

## 2017-06-29 ENCOUNTER — Other Ambulatory Visit: Payer: Self-pay | Admitting: *Deleted

## 2017-06-29 ENCOUNTER — Encounter: Payer: Self-pay | Admitting: Family Medicine

## 2017-06-29 ENCOUNTER — Ambulatory Visit (INDEPENDENT_AMBULATORY_CARE_PROVIDER_SITE_OTHER): Payer: Medicare Other | Admitting: Family Medicine

## 2017-06-29 VITALS — BP 122/84 | Ht 66.5 in | Wt 182.0 lb

## 2017-06-29 DIAGNOSIS — D509 Iron deficiency anemia, unspecified: Secondary | ICD-10-CM

## 2017-06-29 DIAGNOSIS — M5481 Occipital neuralgia: Secondary | ICD-10-CM | POA: Diagnosis not present

## 2017-06-29 DIAGNOSIS — E7849 Other hyperlipidemia: Secondary | ICD-10-CM

## 2017-06-29 DIAGNOSIS — E784 Other hyperlipidemia: Secondary | ICD-10-CM | POA: Diagnosis not present

## 2017-06-29 DIAGNOSIS — I10 Essential (primary) hypertension: Secondary | ICD-10-CM | POA: Diagnosis not present

## 2017-06-29 MED ORDER — ALPRAZOLAM 0.5 MG PO TABS: ORAL_TABLET | ORAL | 5 refills | Status: DC

## 2017-06-29 MED ORDER — OXYCODONE HCL 5 MG PO TABS: 5.0000 mg | ORAL_TABLET | ORAL | 0 refills | Status: DC | PRN

## 2017-06-29 NOTE — Progress Notes (Addendum)
   Subjective:    Patient ID: Amanda Osborne, female    DOB: 05/19/1951, 67 y.o.   MRN: [id number]  HPI  This patient was seen today for chronic pain  The medication list was reviewed and updated.   -Compliance with medication: yes  - Number patient states they take daily: 6 a day  -when was the last dose patient took? 9 am this am  The patient was advised the importance of maintaining medication and not using illegal substances with these.  Refills needed: no  The patient was educated that we can provide 3 monthly scripts for their medication, it is their responsibility to follow the instructions.  Side effects or complications from medications: none  Patient is aware that pain medications are meant to minimize the severity of the pain to allow their pain levels to improve to allow for better function. They are aware of that pain medications cannot totally remove their pain.  Due for UDT ( at least once per year) : 10/03/16  Blood pressure decent control Tries watch diet Has chronic neck pain pain medicine helps her manage it She does not want to go back to see specialist Takes her cholesterol medicine Previous labs reviewed Does have iron deficient anemia Last colonoscopy was 2009      Review of Systems  Constitutional: Negative for activity change and appetite change.  Gastrointestinal: Negative for abdominal pain and vomiting.  Neurological: Negative for weakness.  Psychiatric/Behavioral: Negative for confusion.       Objective:   Physical Exam  Constitutional: She appears well-nourished. No distress.  HENT:  Head: Normocephalic.  Cardiovascular: Normal rate, regular rhythm and normal heart sounds.   No murmur heard. Pulmonary/Chest: Effort normal and breath sounds normal.  Musculoskeletal: She exhibits no edema.  Lymphadenopathy:    She has no cervical adenopathy.  Neurological: She is alert.  Psychiatric: Her behavior is normal.  Vitals  reviewed.   25 minutes was spent with the patient. Greater than half the time was spent in discussion and answering questions and counseling regarding the issues that the patient came in for today.  Patient also relates on her nerve impingement related to playing cards and keeping her elbow on a table she will get a pad it should help this     Assessment & Plan:  Iron deficient anemia referral to GI probably needs colonoscopy possible iron infusion Chronic neck pain The patient was seen today as part of a comprehensive visit regarding pain control. Patient's compliance with the medication as well as discussion regarding effectiveness was completed. Prescriptions were written. Patient was advised to follow-up in 3 months. The patient was assessed for any signs of severe side effects. The patient was advised to take the medicine as directed and to report to [place] if any side effect issues.   Hyperlipidemia previous labs reviewed labs will be ordered again in 6 months continue medication Cervical neuralgia pain medicine does assist

## 2017-06-30 ENCOUNTER — Ambulatory Visit: Payer: Medicare Other | Admitting: Family Medicine

## 2017-07-04 ENCOUNTER — Encounter: Payer: Self-pay | Admitting: Family Medicine

## 2017-07-11 ENCOUNTER — Other Ambulatory Visit: Payer: Self-pay | Admitting: Family Medicine

## 2017-07-11 NOTE — Telephone Encounter (Signed)
May refill this +3 additional

## 2017-07-13 ENCOUNTER — Ambulatory Visit (INDEPENDENT_AMBULATORY_CARE_PROVIDER_SITE_OTHER): Payer: Medicare Other | Admitting: Internal Medicine

## 2017-07-17 ENCOUNTER — Ambulatory Visit (INDEPENDENT_AMBULATORY_CARE_PROVIDER_SITE_OTHER): Payer: Medicare Other | Admitting: Internal Medicine

## 2017-07-26 ENCOUNTER — Other Ambulatory Visit: Payer: Self-pay | Admitting: Nurse Practitioner

## 2017-08-10 ENCOUNTER — Ambulatory Visit (INDEPENDENT_AMBULATORY_CARE_PROVIDER_SITE_OTHER): Payer: Medicare Other | Admitting: Internal Medicine

## 2017-08-10 ENCOUNTER — Encounter (INDEPENDENT_AMBULATORY_CARE_PROVIDER_SITE_OTHER): Payer: Self-pay | Admitting: Internal Medicine

## 2017-08-10 DIAGNOSIS — D509 Iron deficiency anemia, unspecified: Secondary | ICD-10-CM

## 2017-08-10 NOTE — Progress Notes (Addendum)
Subjective:    Patient ID: Amanda Osborne, female    DOB: [birth year], 66 y.o.   MRN: [id number]  HPI Referred by Dr. Wolfgang Phoenix for anemia. Hx of IDA and has received Iron infusions in the past at the [facility].  Her last colonoscopy was in 2009 in [place]. She cannot tell me who did the colonoscopy. She says she has not had any rectal bleeding. Stools have been normal color. They are dark now from taking Iron.  No change in her stools.  She has hx of gastric bypass in 1981. No recent weight loss.  Ibuprofen as needed (Take 1-2 Ibuprofen a day). She does take BC powders about once a weeks. No abdominal pain. She takes Miralax for constipation and for her hemorrhoids.  She says she does not want to schedule an EGD/Colonoscopy today. She says her father passed in March and she is closing the estate.     07/06/2017 Ferritin 14. Two years ago (04/28/2017) ferritin was 61).  [id number], UIBC 339, Iron 30, Iron Satu 8.  10/27/2015 EGD/ED   Indications:  Patient is 66 year old Caucasian female was evaluated 8 weeks ago for 40 pound weight loss nausea vomiting dysphagia and epigastric pain. She was found to have deep ulcer at distal esophagus along with stricture ulcer gastric pouch as well as jejunal ulcers. Biopsy from jejunal ulcers revealed benign etiology. Stricture could not be dilated because of ulceration. Patient was advised to discontinue NSAIDs. She remains on PPI. She's had very few episodes of nausea and vomiting. She remains with dysphagia. She has not lost any more weight.     She had been taking BC powders x 1 a day plus Motring 200mg  (2 tabs) three times a day).                                                                                             Impression: Healed esophagitis. High-grade stricture at GE junction was dilated to 15 mm balloon dilator. Moderate size gastric pouch. Gastric ulcer is healed. Near complete healing of jejunal ulcers. Noncritical jejunal  narrowing.                      Review of Systems Past Medical History:  Diagnosis Date  . Anxiety   . Arthritis   . Cervical disc herniation    c5 and c6  . Chronic pain   . Complication of anesthesia   . Fibromyalgia   . GERD (gastroesophageal reflux disease)   . Headache(784.0)    Hx: of migraines until age 85's  . Hyperlipidemia   . Hypertension   . Iron deficiency anemia 11/30/2012  . Narcolepsy   . Occipital neuralgia   . PONV (postoperative nausea and vomiting)   . Unspecified vitamin D deficiency 03/26/2013  . Wrist fracture, bilateral 2005 and 2010   right then left    Past Surgical History:  Procedure Laterality Date  . ANTERIOR CERVICAL DECOMP/DISCECTOMY FUSION  2000  . ANTERIOR CERVICAL DECOMP/DISCECTOMY FUSION N/A 08/05/2013   Procedure: Cervical five-six Anterior cervical decompression/diskectomy/fusion/ Synthes plate removal;  Surgeon: Kristeen Miss, MD;  Location:  [place] [facility];  Service: Neurosurgery;  Laterality: N/A;  . BONE GRAFT HIP ILIAC CREST  2006   to right wrist to correct fracture  . COLONOSCOPY     Hx; of  . DILATION AND CURETTAGE OF UTERUS    . ESOPHAGEAL DILATION N/A 10/22/2015   Procedure: ESOPHAGEAL DILATION;  Surgeon: Rogene Houston, MD;  [facility];  Service: Endoscopy;  Laterality: N/A;  . ESOPHAGOGASTRODUODENOSCOPY N/A 08/28/2015   Procedure: ESOPHAGOGASTRODUODENOSCOPY (EGD);  Surgeon: Rogene Houston, MD;  Location: [facility];  Service: Endoscopy;  Laterality: N/A;  910am  . ESOPHAGOGASTRODUODENOSCOPY N/A 10/22/2015   Procedure: ESOPHAGOGASTRODUODENOSCOPY (EGD);  Surgeon: Rogene Houston, MD;  [facility]: [facility];  Service: Endoscopy;  Laterality: N/A;  12:00  . FRACTURE SURGERY     Hx: of right wrist surgery  . GASTRIC BYPASS  1981  . TONSILLECTOMY      Allergies  Allergen Reactions  . Celexa [Citalopram Hydrobromide] Nausea And Vomiting  . Fosamax [Alendronate Sodium]     Esophagitis   . Neurontin  [Gabapentin]     Drowsy   . Soma [Carisoprodol]     dizzy  . Statins Other (See Comments)    MYALGIAS    Current Outpatient Prescriptions on File Prior to Visit  Medication Sig Dispense Refill  . ALPRAZolam (XANAX) 0.5 MG tablet TAKE ONE TABLET TWO TIMES DAILY AS NEEDED. 60 tablet 2  . Cholecalciferol (VITAMIN D3) 10000 UNITS capsule Take 2,000 Units by mouth 2 (two) times daily.     . Cyanocobalamin (VITAMIN B-12 PO) Take 1 mL by mouth daily.    . famotidine (PEPCID) 20 MG tablet take 1 tablet by mouth twice a day 60 tablet 12  . fluticasone (FLONASE) 50 MCG/ACT nasal spray instill 2 sprays into each nostril once daily 16 g 5  . hydrochlorothiazide (HYDRODIURIL) 25 MG tablet take 1 tablet by mouth once daily 90 tablet 1  . ibuprofen (ADVIL) 200 MG tablet Take 2 tablets by mouth three times a day (Patient taking differently: As needed) 30 tablet 0  . lisinopril (PRINIVIL,ZESTRIL) 5 MG tablet take 1/2 tablet once daily 90 tablet 1  . oxyCODONE (OXY IR/ROXICODONE) 5 MG immediate release tablet Take 1 tablet (5 mg total) by mouth every 4 (four) hours as needed for severe pain. 180 tablet 0  . polyethylene glycol powder (GLYCOLAX/MIRALAX) powder Take 17 g by mouth as needed (for constipation).     . pravastatin (PRAVACHOL) 20 MG tablet TAKE 1 TAB ON MONDAY,WEDNESDAY,AND FRIDAY. 12 tablet 5  . tiZANidine (ZANAFLEX) 4 MG tablet take 1 tablet by mouth every 8 hours (Patient taking differently: as needed) 30 tablet 1  . triamcinolone cream (KENALOG) 0.1 % Apply 1 application topically 2 (two) times daily. 15 g 4   No current facility-administered medications on file prior to visit.         Objective:   Physical Exam Blood pressure 120/70, pulse 60, temperature 98.3 F (36.8 C), height 5' 6.5" (1.689 m), weight 186 lb 14.4 oz (84.8 kg).  Alert and oriented. Skin warm and dry. Oral mucosa is moist.   . Sclera anicteric, conjunctivae is pink. Thyroid not enlarged. No cervical lymphadenopathy.  Lungs clear. Heart regular rate and rhythm.  Abdomen is soft. Bowel sounds are positive. No hepatomegaly. No abdominal masses felt. No tenderness.  No edema to lower extremities.  Stool brown and guaiac positive.         Assessment & Plan:  IDA. PUD, colonic  neoplasm needs to be ruled out. Will make another appt for  months (at her request)  and plan to schedule the procedures. Procedures will need to be with Propofol.  OV in 4 months.  She has declined the procedures today.  CBC and Ferritin today. She was advised to stop the Ibuprofen

## 2017-08-10 NOTE — Patient Instructions (Addendum)
Labs today. OV in 4 months.

## 2017-08-11 LAB — CBC WITH DIFFERENTIAL/PLATELET
BASOS PCT: 0.6 %
Basophils Absolute: 50 cells/uL (ref 0–200)
EOS ABS: 328 {cells}/uL (ref 15–500)
Eosinophils Relative: 3.9 %
HEMATOCRIT: 38.8 % (ref 35.0–45.0)
HEMOGLOBIN: 12.7 g/dL (ref 11.7–15.5)
LYMPHS ABS: 1159 {cells}/uL (ref 850–3900)
MCH: 26.7 pg — AB (ref 27.0–33.0)
MCHC: 32.7 g/dL (ref 32.0–36.0)
MCV: 81.5 fL (ref 80.0–100.0)
MPV: 9.6 fL (ref 7.5–12.5)
Monocytes Relative: 6.4 %
Neutro Abs: 6325 cells/uL (ref 1500–7800)
Neutrophils Relative %: 75.3 %
Platelets: 341 10*3/uL (ref 140–400)
RBC: 4.76 10*6/uL (ref 3.80–5.10)
RDW: 16.1 % — ABNORMAL HIGH (ref 11.0–15.0)
TOTAL LYMPHOCYTE: 13.8 %
WBC: 8.4 10*3/uL (ref 3.8–10.8)
WBCMIX: 538 {cells}/uL (ref 200–950)

## 2017-08-11 LAB — FERRITIN: Ferritin: 15 ng/mL — ABNORMAL LOW (ref 20–288)

## 2017-08-13 ENCOUNTER — Other Ambulatory Visit: Payer: Self-pay | Admitting: Family Medicine

## 2017-08-22 ENCOUNTER — Other Ambulatory Visit: Payer: Self-pay | Admitting: Family Medicine

## 2017-08-29 DIAGNOSIS — H1031 Unspecified acute conjunctivitis, right eye: Secondary | ICD-10-CM | POA: Diagnosis not present

## 2017-09-01 ENCOUNTER — Encounter: Payer: Self-pay | Admitting: Nurse Practitioner

## 2017-09-01 ENCOUNTER — Ambulatory Visit (INDEPENDENT_AMBULATORY_CARE_PROVIDER_SITE_OTHER): Payer: Medicare Other | Admitting: Nurse Practitioner

## 2017-09-01 VITALS — BP 110/66 | Temp 98.1°F | Ht 66.5 in | Wt 183.0 lb

## 2017-09-01 DIAGNOSIS — B9689 Other specified bacterial agents as the cause of diseases classified elsewhere: Secondary | ICD-10-CM

## 2017-09-01 DIAGNOSIS — H1031 Unspecified acute conjunctivitis, right eye: Secondary | ICD-10-CM

## 2017-09-01 DIAGNOSIS — J069 Acute upper respiratory infection, unspecified: Secondary | ICD-10-CM | POA: Diagnosis not present

## 2017-09-01 MED ORDER — TOBRAMYCIN-DEXAMETHASONE 0.3-0.1 % OP SUSP: 1.0000 [drp] | Freq: Four times a day (QID) | OPHTHALMIC | 0 refills | Status: DC

## 2017-09-01 MED ORDER — AMOXICILLIN-POT CLAVULANATE 875-125 MG PO TABS: 1.0000 | ORAL_TABLET | Freq: Two times a day (BID) | ORAL | 0 refills | Status: DC

## 2017-09-01 NOTE — Progress Notes (Signed)
Subjective:  Presents for complaints of sinus symptoms over the past week. Head congestion. Sinus pressure. No fever sore throat or ear pain. No cough or wheezing. Has started feeling slightly tight in her chest, does not use an inhaler. Has a history of recurrent sinusitis especially in the fall. Also having a flareup of her right eye drainage and irritation. Was prescribed an eyedrop by urgent care which did not work. Was prescribed eyedrops by her optometrist years ago, uses on a rare basis which works well. She has brought her bottle in today for Tobrex ophthalmic drops written in 2009. No eye pain. No visual changes. No history of injury. Feels slightly scratchy. Has had similar recurrent symptoms in the past. No relief with OTC allergy eyedrops.  Objective:   BP 110/66   Temp 98.1 F (36.7 C) (Oral)   Ht 5' 6.5" (1.689 m)   Wt 183 lb (83 kg)   BMI 29.09 kg/m  NAD. Alert, oriented. TMs clear effusion, no erythema. Right conjunctiva mildly injected, minimal puffiness noted around, no tenderness around the eye. No preauricular adenopathy. Pharynx injected with green PND noted. Neck supple with mild soft anterior adenopathy. Lungs breath sounds mildly diminished but clear. No wheezing or tachypnea. Normal color. Heart regular rate rhythm.  Assessment:  Bacterial upper respiratory infection  Acute conjunctivitis of right eye, unspecified acute conjunctivitis type    Plan:   Meds ordered this encounter  Medications  . amoxicillin-clavulanate (AUGMENTIN) 875-125 MG tablet    Sig: Take 1 tablet by mouth 2 (two) times daily.    Dispense:  20 tablet    Refill:  0    Order Specific Question:   Supervising Provider    Answer:   Mikey Kirschner [[id number]]  . tobramycin-dexamethasone (TOBRADEX) ophthalmic solution    Sig: Place 1 drop into the right eye 4 (four) times daily. Up to 5 days    Dispense:  5 mL    Refill:  0    Order Specific Question:   Supervising Provider    Answer:   Mikey Kirschner [[id number]]   Reminded patient to use Tobrex eyedrops sparingly no more than 5 days at a time. Avoid frequent use. Verbalized understanding. OTC meds as directed. Call back in 7-10 days if no improvement, sooner if worse.

## 2017-09-07 ENCOUNTER — Other Ambulatory Visit: Payer: Self-pay | Admitting: *Deleted

## 2017-09-07 ENCOUNTER — Telehealth: Payer: Self-pay | Admitting: Family Medicine

## 2017-09-07 NOTE — Telephone Encounter (Signed)
omnicef susp 300 mg bid ten d

## 2017-09-07 NOTE — Telephone Encounter (Signed)
Pt states she's unable to take the antibiotic that Hoyle Sauer gave her this week  Was given Augmentin  States they're too big and even with cutting them in to 4 pieces she's unable to get them down.  States she has a very narrow esophagus and the sharpe edges make it very difficult to swallow the pills  Wonders if we could give her a different antibiotic or the same one in liquid form   Please advise & call pt when done   [facility]

## 2017-09-07 NOTE — Telephone Encounter (Signed)
[facility] [facility] pharm would have to order omnicef 300mg  susp. Is it ok for her to wait til tomorrow or can it be changed to something else

## 2017-09-08 MED ORDER — CEFDINIR 300 MG PO CAPS: 300.0000 mg | ORAL_CAPSULE | Freq: Two times a day (BID) | ORAL | 0 refills | Status: AC

## 2017-09-08 NOTE — Telephone Encounter (Signed)
Ok to wait til today

## 2017-09-08 NOTE — Telephone Encounter (Signed)
Spoke with patient and informed her that pharmacy would have to order omnicef suspension. Patient verbalized understanding and stated that she could take medication in capsule form instead of tablet. Per Dr.Steve Luking capsules were sent into pharmacy.

## 2017-09-13 DIAGNOSIS — H524 Presbyopia: Secondary | ICD-10-CM | POA: Diagnosis not present

## 2017-09-13 DIAGNOSIS — H52223 Regular astigmatism, bilateral: Secondary | ICD-10-CM | POA: Diagnosis not present

## 2017-09-13 DIAGNOSIS — H5213 Myopia, bilateral: Secondary | ICD-10-CM | POA: Diagnosis not present

## 2017-09-13 DIAGNOSIS — H2513 Age-related nuclear cataract, bilateral: Secondary | ICD-10-CM | POA: Diagnosis not present

## 2017-09-27 DIAGNOSIS — H35413 Lattice degeneration of retina, bilateral: Secondary | ICD-10-CM | POA: Diagnosis not present

## 2017-09-27 DIAGNOSIS — H43813 Vitreous degeneration, bilateral: Secondary | ICD-10-CM | POA: Diagnosis not present

## 2017-09-29 ENCOUNTER — Encounter: Payer: Self-pay | Admitting: Family Medicine

## 2017-09-29 ENCOUNTER — Ambulatory Visit: Payer: Medicare Other | Admitting: Family Medicine

## 2017-09-29 VITALS — BP 120/70 | Ht 67.0 in | Wt 188.0 lb

## 2017-09-29 DIAGNOSIS — I10 Essential (primary) hypertension: Secondary | ICD-10-CM

## 2017-09-29 DIAGNOSIS — R7989 Other specified abnormal findings of blood chemistry: Secondary | ICD-10-CM | POA: Diagnosis not present

## 2017-09-29 DIAGNOSIS — M5481 Occipital neuralgia: Secondary | ICD-10-CM

## 2017-09-29 DIAGNOSIS — K219 Gastro-esophageal reflux disease without esophagitis: Secondary | ICD-10-CM | POA: Diagnosis not present

## 2017-09-29 DIAGNOSIS — R5383 Other fatigue: Secondary | ICD-10-CM | POA: Diagnosis not present

## 2017-09-29 DIAGNOSIS — M4712 Other spondylosis with myelopathy, cervical region: Secondary | ICD-10-CM | POA: Diagnosis not present

## 2017-09-29 DIAGNOSIS — E7849 Other hyperlipidemia: Secondary | ICD-10-CM | POA: Diagnosis not present

## 2017-09-29 MED ORDER — TIZANIDINE HCL 4 MG PO TABS: 4.0000 mg | ORAL_TABLET | Freq: Three times a day (TID) | ORAL | 3 refills | Status: DC

## 2017-09-29 MED ORDER — OXYCODONE HCL 5 MG PO TABS: 5.0000 mg | ORAL_TABLET | ORAL | 0 refills | Status: DC | PRN

## 2017-09-29 NOTE — Progress Notes (Signed)
Subjective:    Patient ID: Amanda Osborne, female    DOB: 1950/12/09, 66 y.o.   MRN: [id number]  HPI This patient was seen today for chronic pain  The medication list was reviewed and updated.   -Compliance with medication:  Takes daily   - Number patient states they take daily: Takes 6 tablets per day   -when was the last dose patient took? Last dose 0900 The patient was advised the importance of maintaining medication and not using illegal substances with these.  Refills needed: Yes   The patient was educated that we can provide 3 monthly scripts for their medication, it is their responsibility to follow the instructions.  Side effects or complications from medications: None  Patient is aware that pain medications are meant to minimize the severity of the pain to allow their pain levels to improve to allow for better function. They are aware of that pain medications cannot totally remove their pain.  Due for UDT ( at least once per year) :   Has concerns of neck pain.  She has ongoing neck pain and discomfort she feels injections would help she would like to see Dr. Hardin Negus with pain management  She had borderline thyroid test recently she would like to repeat this  She does have heme positive stools that she relates to having too much use of anti-inflammatories.  She wonders if prednisone would help her  Has issue with right eye she went and saw an eye specialist they ruled out detached retina   Review of Systems  Constitutional: Negative for activity change and appetite change.  HENT: Negative for congestion.   Respiratory: Negative for cough.   Cardiovascular: Negative for chest pain.  Gastrointestinal: Negative for abdominal pain and vomiting.  Musculoskeletal: Positive for back pain.  Skin: Negative for color change.  Neurological: Negative for weakness.  Psychiatric/Behavioral: Negative for confusion.       Objective:   Physical Exam  Constitutional:  She appears well-nourished. No distress.  HENT:  Head: Normocephalic.  Right Ear: External ear normal.  Left Ear: External ear normal.  Eyes: Right eye exhibits no discharge. Left eye exhibits no discharge.  Neck: No tracheal deviation present.  Cardiovascular: Normal rate, regular rhythm and normal heart sounds.  No murmur heard. Pulmonary/Chest: Effort normal and breath sounds normal. No respiratory distress. She has no wheezes. She has no rales.  Musculoskeletal: She exhibits no edema.  Lymphadenopathy:    She has no cervical adenopathy.  Neurological: She is alert.  Psychiatric: Her behavior is normal.  Vitals reviewed.   25 minutes was spent with the patient. Greater than half the time was spent in discussion and answering questions and counseling regarding the issues that the patient came in for today.       Assessment & Plan:  The patient was seen today as part of a comprehensive visit regarding pain control. Patient's compliance with the medication as well as discussion regarding effectiveness was completed. Prescriptions were written. Patient was advised to follow-up in 3 months. The patient was assessed for any signs of severe side effects. The patient was advised to take the medicine as directed and to report to [place] if any side effect issues.  Patient having significant neck pain she is requesting to see Dr. Hardin Negus for evaluation for injections of her neck she states she would like to do her medication management through our office because of finances and does not want to get her pain medicine through pain  medicine clinic therefore we will try to refer her to Dr. Hardin Negus for evaluation for injections  Hyperlipidemia previous labs reviewed continue current measures watch diet  Hypothyroidism will repeat TSH may need to be on medication  As for prednisone if she would like to do a shot of Depo-Medrol 40 mg we can unfortunately between all the other issues we discussed about  this was not relayed to the nurse so therefore the patient did not get the shot we will connect with the patient if she would like to get this shot she can come by to have it at her convenience

## 2017-09-30 LAB — TSH: TSH: 5.18 u[IU]/mL — ABNORMAL HIGH (ref 0.450–4.500)

## 2017-09-30 LAB — T4, FREE: FREE T4: 1.13 ng/dL (ref 0.82–1.77)

## 2017-10-02 NOTE — Progress Notes (Signed)
Spoke with patient and informed her per Dr.Scott Luking- forgot to ask if you would like a shot of depomedrol 40 mg for your neck pain. Patient verbalized understanding and stated that she would like shot. Advised her to come in for nurse visit to get shot patient verbalized understanding.

## 2017-10-03 ENCOUNTER — Ambulatory Visit (INDEPENDENT_AMBULATORY_CARE_PROVIDER_SITE_OTHER): Payer: Medicare Other

## 2017-10-03 DIAGNOSIS — M4712 Other spondylosis with myelopathy, cervical region: Secondary | ICD-10-CM | POA: Diagnosis not present

## 2017-10-03 DIAGNOSIS — M542 Cervicalgia: Secondary | ICD-10-CM

## 2017-10-03 MED ORDER — LEVOTHYROXINE SODIUM 25 MCG PO CAPS: ORAL_CAPSULE | ORAL | 5 refills | Status: DC

## 2017-10-03 MED ORDER — METHYLPREDNISOLONE ACETATE 40 MG/ML IJ SUSP
40.0000 mg | Freq: Once | INTRAMUSCULAR | Status: AC
  Administered 2017-10-03: 40 mg via INTRAMUSCULAR

## 2017-10-03 NOTE — Addendum Note (Signed)
Addended by: Launa Grill on: 10/03/2017 01:38 PM   Modules accepted: Orders

## 2017-10-03 NOTE — Addendum Note (Signed)
Addended by: Ofilia Neas R on: 10/03/2017 04:30 PM   Modules accepted: Orders

## 2017-10-06 ENCOUNTER — Encounter: Payer: Self-pay | Admitting: Family Medicine

## 2017-10-24 ENCOUNTER — Other Ambulatory Visit: Payer: Self-pay | Admitting: Family Medicine

## 2017-10-26 ENCOUNTER — Telehealth: Payer: Self-pay | Admitting: *Deleted

## 2017-10-26 ENCOUNTER — Encounter: Payer: Self-pay | Admitting: Family Medicine

## 2017-10-26 ENCOUNTER — Ambulatory Visit: Payer: Medicare Other | Admitting: Family Medicine

## 2017-10-26 VITALS — BP 132/70 | Ht 67.0 in | Wt 186.0 lb

## 2017-10-26 DIAGNOSIS — D509 Iron deficiency anemia, unspecified: Secondary | ICD-10-CM | POA: Diagnosis not present

## 2017-10-26 DIAGNOSIS — M25562 Pain in left knee: Secondary | ICD-10-CM | POA: Diagnosis not present

## 2017-10-26 DIAGNOSIS — E039 Hypothyroidism, unspecified: Secondary | ICD-10-CM | POA: Diagnosis not present

## 2017-10-26 DIAGNOSIS — M25561 Pain in right knee: Secondary | ICD-10-CM | POA: Diagnosis not present

## 2017-10-26 DIAGNOSIS — G243 Spasmodic torticollis: Secondary | ICD-10-CM

## 2017-10-26 NOTE — Progress Notes (Signed)
   Subjective:    Patient ID: Amanda Osborne, female    DOB: 05/05/1951, 66 y.o.   MRN: [id number]  HPIrecheck on neck and shoulder pain. She has chronic ongoing neck pain she is seen in neurologist before who did Botox injections she used to see her orthopedist who did injections into the muscles she tried to see Dr. Hardin Negus but she is no longer a patient there she would like to go for further evaluation possibly with Dr. Clarice Pole group  Bilateral knee pain. Patient has bilateral knee pain discomfort Tylenol helps hydrocodone helps I told her to avoid NSAIDs because of her gastric bleeding and iron deficiency anemia  Thyroid med not working. She feels her thyroid medicine isn't helping her enough she relates feeling very fatigued I reminded her that fatigue is more than just thyroid thyroid as part of it so his anemia low iron as well as any depression or physical stress mental stress patient under a lot of mental stress with the estate of her dad's passing  Pt states she comes back in Jan or Feb for pain management. She does not need refills at this time.     Review of Systems She does relate a lot of fatigue tiredness denies abdominal pain rectal bleeding    Objective:   Physical Exam  Lungs clear hearts regular subjected discomfort upper portion of the neck on the left side      Assessment & Plan:  Left side neck dystonia referral to Dr. Ellene Route group hopefully one of the doctors within the group that does injections can help her  Bilateral osteoarthritis of the knees referral to Dr. Aline Brochure for possible injection his steroids patient should not take NSAIDs  Iron deficient anemia along with hypothyroidism check lab work within the next few weeks awake the results of this  Follow-up for chronic pain management

## 2017-10-26 NOTE — Telephone Encounter (Signed)
Pt wanted to make sure she got set up with someone who just did injections for her neck and not actually Dr. Ellene Route. Referral was put in today for Dr. Clarice Pole group.

## 2017-11-02 ENCOUNTER — Encounter: Payer: Self-pay | Admitting: Family Medicine

## 2017-11-03 ENCOUNTER — Other Ambulatory Visit: Payer: Self-pay

## 2017-11-03 MED ORDER — LEVOTHYROXINE SODIUM 25 MCG PO CAPS: ORAL_CAPSULE | ORAL | 1 refills | Status: DC

## 2017-11-12 ENCOUNTER — Other Ambulatory Visit: Payer: Self-pay | Admitting: Family Medicine

## 2017-11-20 ENCOUNTER — Encounter: Payer: Self-pay | Admitting: Family Medicine

## 2017-11-20 ENCOUNTER — Ambulatory Visit: Payer: Medicare Other | Admitting: Family Medicine

## 2017-11-20 VITALS — BP 112/70 | Temp 98.2°F | Ht 67.0 in | Wt 190.0 lb

## 2017-11-20 DIAGNOSIS — J069 Acute upper respiratory infection, unspecified: Secondary | ICD-10-CM | POA: Diagnosis not present

## 2017-11-20 DIAGNOSIS — J019 Acute sinusitis, unspecified: Secondary | ICD-10-CM | POA: Diagnosis not present

## 2017-11-20 DIAGNOSIS — B9689 Other specified bacterial agents as the cause of diseases classified elsewhere: Secondary | ICD-10-CM | POA: Diagnosis not present

## 2017-11-20 MED ORDER — BENZONATATE 100 MG PO CAPS: 100.0000 mg | ORAL_CAPSULE | Freq: Three times a day (TID) | ORAL | 0 refills | Status: DC | PRN

## 2017-11-20 MED ORDER — CEFDINIR 300 MG PO CAPS: 300.0000 mg | ORAL_CAPSULE | Freq: Two times a day (BID) | ORAL | 0 refills | Status: DC

## 2017-11-20 NOTE — Progress Notes (Signed)
   Subjective:    Patient ID: Amanda Osborne, female    DOB: Dec 23, 1950, 66 y.o.   MRN: [id number]   Sinusitis   This is a new problem. Episode onset: one week.  Associated symptoms include congestion, coughing and sneezing. Treatments tried: dayquil, nyquil, theraflu, ibuprofen, alka seltzer.     days of malaise for several days.  Gradual headache.  Positive productive cough  Low-grade fever diminished energy  Review of Systems  HENT: Positive for congestion and sneezing.   Respiratory: Positive for cough.        Objective:   Physical Exam  Alert, mild malaise. Hydration good Vitals stable. frontal/ maxillary tenderness evident positive nasal congestion. pharynx normal neck supple  lungs clear/no crackles or wheezes. heart regular in rhythm       Assessment & Plan:  Impression rhinosinusitis likely post viral, discussed with patient. plan antibiotics prescribed. Questions answered. Symptomatic care discussed. warning signs discussed. WSL

## 2017-12-01 ENCOUNTER — Ambulatory Visit: Payer: Medicare Other | Admitting: Orthopedic Surgery

## 2017-12-01 ENCOUNTER — Ambulatory Visit (INDEPENDENT_AMBULATORY_CARE_PROVIDER_SITE_OTHER): Payer: Medicare Other

## 2017-12-01 ENCOUNTER — Encounter: Payer: Self-pay | Admitting: Orthopedic Surgery

## 2017-12-01 ENCOUNTER — Ambulatory Visit: Payer: Medicare Other

## 2017-12-01 VITALS — BP 102/67 | HR 75 | Ht 67.0 in | Wt 189.0 lb

## 2017-12-01 DIAGNOSIS — M25561 Pain in right knee: Secondary | ICD-10-CM

## 2017-12-01 DIAGNOSIS — M25562 Pain in left knee: Principal | ICD-10-CM

## 2017-12-01 DIAGNOSIS — M7651 Patellar tendinitis, right knee: Secondary | ICD-10-CM

## 2017-12-01 DIAGNOSIS — M170 Bilateral primary osteoarthritis of knee: Secondary | ICD-10-CM | POA: Diagnosis not present

## 2017-12-01 DIAGNOSIS — G8929 Other chronic pain: Secondary | ICD-10-CM

## 2017-12-01 DIAGNOSIS — M7652 Patellar tendinitis, left knee: Secondary | ICD-10-CM

## 2017-12-01 NOTE — Addendum Note (Signed)
Addended byCandice Camp on: 12/01/2017 09:39 AM   Modules accepted: Orders

## 2017-12-01 NOTE — Patient Instructions (Signed)
Recommend physical therapy continue your ibuprofen and oxycodone for pain  Use muscle creams as well to place over the knee

## 2017-12-01 NOTE — Progress Notes (Signed)
NEW PATIENT OFFICE VISIT    Chief Complaint  Patient presents with  . Knee Pain    bilateral     67 year old female status post cervical fusion open treatment internal fixation of the wrist C5-C6 cervical fusion gastric bypass presents for evaluation of bilateral knees.  She complains of worsening dull aching bilateral knee pain which has been present for several years but now wakes her up at night which she is already on oxycodone and ibuprofen but she does have stomach problems she has not had any physical therapy    Review of Systems  Constitutional: Negative for fever.  Respiratory: Negative for shortness of breath.   Cardiovascular: Negative for chest pain.  Musculoskeletal: Positive for neck pain.  Skin: Negative.      Past Medical History:  Diagnosis Date  . Anxiety   . Arthritis   . Cervical disc herniation    c5 and c6  . Chronic pain   . Complication of anesthesia   . Fibromyalgia   . GERD (gastroesophageal reflux disease)   . Headache(784.0)    Hx: of migraines until age 54's  . Hyperlipidemia   . Hypertension   . Iron deficiency anemia 11/30/2012  . Narcolepsy   . Occipital neuralgia   . PONV (postoperative nausea and vomiting)   . Unspecified vitamin D deficiency 03/26/2013  . Wrist fracture, bilateral 2005 and 2010   right then left    Past Surgical History:  Procedure Laterality Date  . ANTERIOR CERVICAL DECOMP/DISCECTOMY FUSION  2000  . ANTERIOR CERVICAL DECOMP/DISCECTOMY FUSION N/A 08/05/2013   Procedure: Cervical five-six Anterior cervical decompression/diskectomy/fusion/ Synthes plate removal;  Surgeon: Kristeen Miss, MD;  Location: [place] [facility];  Service: Neurosurgery;  Laterality: N/A;  . BONE GRAFT HIP ILIAC CREST  2006   to right wrist to correct fracture  . COLONOSCOPY     Hx; of  . DILATION AND CURETTAGE OF UTERUS    . ESOPHAGEAL DILATION N/A 10/22/2015   Procedure: ESOPHAGEAL DILATION;  Surgeon: Rogene Houston, MD;  [facility];  Service: Endoscopy;  Laterality: N/A;  . ESOPHAGOGASTRODUODENOSCOPY N/A 08/28/2015   Procedure: ESOPHAGOGASTRODUODENOSCOPY (EGD);  Surgeon: Rogene Houston, MD;  [facility];  Service: Endoscopy;  Laterality: N/A;  910am  . ESOPHAGOGASTRODUODENOSCOPY N/A 10/22/2015   Procedure: ESOPHAGOGASTRODUODENOSCOPY (EGD);  Surgeon: Rogene Houston, MD;  [facility];  Service: Endoscopy;  Laterality: N/A;  12:00  . FRACTURE SURGERY     Hx: of right wrist surgery  . GASTRIC BYPASS  1981  . TONSILLECTOMY      Family History  Problem Relation Age of Onset  . Stroke Mother   . Heart disease Mother   . Diabetes Mother   . Heart disease Father   . Cancer - Colon Father   . Other Brother    Social History   Tobacco Use  . Smoking status: Never Smoker  . Smokeless tobacco: Never Used  Substance Use Topics  . Alcohol use: No  . Drug use: No    Allergies  Allergen Reactions  . Celexa [Citalopram Hydrobromide] Nausea And Vomiting  . Fosamax [Alendronate Sodium]     Esophagitis   . Neurontin [Gabapentin]     Drowsy   . Soma [Carisoprodol]     dizzy  . Statins Other (See Comments)    MYALGIAS     No outpatient medications have been marked as taking for the 12/01/17 encounter (Appointment) with Carole Civil, MD.  BP 102/67   Pulse 75   Ht 5\' 7"  (1.702 m)   Wt 189 lb (85.7 kg)   BMI 29.60 kg/m   Physical Exam  Constitutional: She is oriented to person, place, and time. She appears well-developed and well-nourished.  Neurological: She is alert and oriented to person, place, and time. Gait abnormal.  Psychiatric: She has a normal mood and affect. Judgment normal.  Vitals reviewed.   Ortho Exam   Left knee overall alignment looks normal she has tenderness lateral joint line over the meniscal region and the patellar tendon no effusion motion looks normal knees are stable strength is normal skin is intact except for some abrasions pulses are good  sensation is normal  Right knee overall alignment looks good range of motion looks normal knees are stable strength is normal skin is intact other than some minor abrasions pulse and perfusion is normal sensation is intact  Tenderness is primarily over the patellar tendon  Bilateral knee x-rays  Procedure note left knee injection verbal consent was obtained to inject left knee joint  Timeout was completed to confirm the site of injection  The medications used were 40 mg of Depo-Medrol and 1% lidocaine 3 cc  Anesthesia was provided by ethyl chloride and the skin was prepped with alcohol.  After cleaning the skin with alcohol a 20-gauge needle was used to inject the left knee joint. There were no complications. A sterile bandage was applied.   Procedure note right knee injection verbal consent was obtained to inject right knee joint  Timeout was completed to confirm the site of injection  The medications used were 40 mg of Depo-Medrol and 1% lidocaine 3 cc  Anesthesia was provided by ethyl chloride and the skin was prepped with alcohol.  After cleaning the skin with alcohol a 20-gauge needle was used to inject the right knee joint. There were no complications. A sterile bandage was applied.   PLAN:   Recommend physical therapy  The patient is welcome to follow-up if physical therapy does not work

## 2017-12-11 ENCOUNTER — Other Ambulatory Visit: Payer: Self-pay | Admitting: Family Medicine

## 2017-12-11 ENCOUNTER — Ambulatory Visit (INDEPENDENT_AMBULATORY_CARE_PROVIDER_SITE_OTHER): Payer: Medicare Other | Admitting: Internal Medicine

## 2017-12-27 ENCOUNTER — Ambulatory Visit: Payer: Medicare Other | Admitting: Family Medicine

## 2017-12-27 ENCOUNTER — Encounter: Payer: Self-pay | Admitting: Family Medicine

## 2017-12-27 VITALS — BP 128/70 | Ht 67.0 in | Wt 185.0 lb

## 2017-12-27 DIAGNOSIS — M816 Localized osteoporosis [Lequesne]: Secondary | ICD-10-CM | POA: Diagnosis not present

## 2017-12-27 DIAGNOSIS — M5481 Occipital neuralgia: Secondary | ICD-10-CM | POA: Diagnosis not present

## 2017-12-27 DIAGNOSIS — M8580 Other specified disorders of bone density and structure, unspecified site: Secondary | ICD-10-CM

## 2017-12-27 DIAGNOSIS — Z780 Asymptomatic menopausal state: Secondary | ICD-10-CM

## 2017-12-27 DIAGNOSIS — Z79891 Long term (current) use of opiate analgesic: Secondary | ICD-10-CM

## 2017-12-27 MED ORDER — OXYCODONE HCL 5 MG PO TABS: 5.0000 mg | ORAL_TABLET | ORAL | 0 refills | Status: DC | PRN

## 2017-12-27 NOTE — Progress Notes (Signed)
   Subjective:    Patient ID: Amanda Osborne, female    DOB: May 18, 1951, 67 y.o.   MRN: [id number]  HPI This patient was seen today for chronic pain. Takes for joint pain esp in knees  The medication list was reviewed and updated.   -Compliance with medication: yes  - Number patient states they take daily: 6  -when was the last dose patient took? today  The patient was advised the importance of maintaining medication and not using illegal substances with these.  Here for refills and follow up  The patient was educated that we can provide 3 monthly scripts for their medication, it is their responsibility to follow the instructions.  Side effects or complications from medications: none  Patient is aware that pain medications are meant to minimize the severity of the pain to allow their pain levels to improve to allow for better function. They are aware of that pain medications cannot totally remove their pain.  Due for UDT ( at least once per year) : due today  Concerns about when to take thyroid med.   We discussed when to take her thyroid medication      Review of Systems  Constitutional: Negative for activity change and appetite change.  HENT: Negative for congestion.   Respiratory: Negative for cough.   Cardiovascular: Negative for chest pain.  Gastrointestinal: Negative for abdominal pain and vomiting.  Skin: Negative for color change.  Neurological: Negative for weakness.  Psychiatric/Behavioral: Negative for confusion.       Objective:   Physical Exam  Constitutional: She appears well-nourished. No distress.  HENT:  Head: Normocephalic.  Right Ear: External ear normal.  Left Ear: External ear normal.  Eyes: Right eye exhibits no discharge. Left eye exhibits no discharge.  Neck: No tracheal deviation present.  Cardiovascular: Normal rate, regular rhythm and normal heart sounds.  No murmur heard. Pulmonary/Chest: Effort normal and breath sounds normal. No  respiratory distress. She has no wheezes. She has no rales.  Musculoskeletal: She exhibits no edema.  Lymphadenopathy:    She has no cervical adenopathy.  Neurological: She is alert.  Psychiatric: Her behavior is normal.  Vitals reviewed.         Assessment & Plan:  Chronic neck pain Chronic pain syndrome related to her neck Uses her pain medicine as directed denies abusing it Does not cause her significant issues at all Does help her with her function  She also has chronic anxiety and depression states her depression is doing fairly well does not tolerate antidepressants nerve medications does help but she denies abusing these  she is not suicidal   she is due for bone density

## 2017-12-29 ENCOUNTER — Ambulatory Visit: Payer: Medicare Other | Admitting: Family Medicine

## 2018-01-02 LAB — TOXASSURE SELECT 13 (MW), URINE

## 2018-01-04 ENCOUNTER — Other Ambulatory Visit (HOSPITAL_COMMUNITY): Payer: Medicare Other

## 2018-01-12 ENCOUNTER — Other Ambulatory Visit: Payer: Self-pay | Admitting: Family Medicine

## 2018-01-12 NOTE — Telephone Encounter (Signed)
Ok plus five monthly ref 

## 2018-01-24 ENCOUNTER — Telehealth: Payer: Self-pay | Admitting: Family Medicine

## 2018-01-24 MED ORDER — HYDROCHLOROTHIAZIDE 25 MG PO TABS: 25.0000 mg | ORAL_TABLET | Freq: Every day | ORAL | 1 refills | Status: DC

## 2018-01-24 NOTE — Telephone Encounter (Signed)
Prescription sent electronically to pharmacy. Patient notified. 

## 2018-01-24 NOTE — Telephone Encounter (Signed)
Patient is requesting Rx for hydrochlorothiazide 25 mg 90 day script.  She ran out on 01/22/18.   [facility] on [address]

## 2018-02-16 ENCOUNTER — Telehealth: Payer: Self-pay

## 2018-02-16 DIAGNOSIS — K439 Ventral hernia without obstruction or gangrene: Secondary | ICD-10-CM | POA: Diagnosis not present

## 2018-02-16 DIAGNOSIS — R1084 Generalized abdominal pain: Secondary | ICD-10-CM | POA: Diagnosis not present

## 2018-02-16 NOTE — Telephone Encounter (Signed)
Patient called states she was taking a shower this am and ran her hand across her belly and there was a hard knot the size of a grapefruit between her breast bone and naval. She says it does not hurt. I advised that we did not have any available appt today and I would recommend going to ed or urgent care to have it looked at. She states she understands and will go to an urgent care.

## 2018-02-20 ENCOUNTER — Other Ambulatory Visit (HOSPITAL_COMMUNITY): Payer: Self-pay | Admitting: Physician Assistant

## 2018-02-20 DIAGNOSIS — K439 Ventral hernia without obstruction or gangrene: Secondary | ICD-10-CM

## 2018-02-23 ENCOUNTER — Encounter (HOSPITAL_COMMUNITY): Payer: Self-pay

## 2018-02-23 ENCOUNTER — Ambulatory Visit (HOSPITAL_COMMUNITY)
Admission: RE | Admit: 2018-02-23 | Discharge: 2018-02-23 | Disposition: A | Discharge status: Home / Self Care | Payer: Medicare Other | Source: Ambulatory Visit | Attending: Physician Assistant | Admitting: Physician Assistant

## 2018-02-23 DIAGNOSIS — K429 Umbilical hernia without obstruction or gangrene: Secondary | ICD-10-CM | POA: Diagnosis not present

## 2018-02-23 DIAGNOSIS — I7090 Unspecified atherosclerosis: Secondary | ICD-10-CM | POA: Insufficient documentation

## 2018-02-23 DIAGNOSIS — K439 Ventral hernia without obstruction or gangrene: Secondary | ICD-10-CM

## 2018-02-23 LAB — POCT I-STAT CREATININE: Creatinine, Ser: 0.6 mg/dL (ref 0.44–1.00)

## 2018-02-23 MED ORDER — IOPAMIDOL (ISOVUE-300) INJECTION 61%
100.0000 mL | Freq: Once | INTRAVENOUS | Status: AC | PRN
  Administered 2018-02-23: 100 mL via INTRAVENOUS

## 2018-02-27 ENCOUNTER — Ambulatory Visit: Payer: Medicare Other | Admitting: Family Medicine

## 2018-02-27 VITALS — Ht 67.0 in | Wt 185.0 lb

## 2018-02-27 DIAGNOSIS — K429 Umbilical hernia without obstruction or gangrene: Secondary | ICD-10-CM

## 2018-02-27 DIAGNOSIS — E039 Hypothyroidism, unspecified: Secondary | ICD-10-CM | POA: Insufficient documentation

## 2018-02-27 DIAGNOSIS — E038 Other specified hypothyroidism: Secondary | ICD-10-CM

## 2018-02-27 NOTE — Progress Notes (Signed)
   Subjective:    Patient ID: Amanda Osborne, female    DOB: 1951/10/11, 67 y.o.   MRN: [id number]  HPI Patient arrives to follow up on recent visit to urgent care. Umbilical hernia she had a CAT scan Patient diagnosed at the urgent care center Felt a large hernia. She denies nausea vomiting diarrhea Denies rectal bleeding He is on multiple medications including pain medicine CAT scan reviewed with patient in detail  Patient stated she had CT that showed large hernia and she needs referral to surgeon.   Review of Systems Denies chest tightness pressure pain shortness of breath denies numbness tingling joint pains does relate abdominal discomfort around the hernia denies nausea vomiting diarrhea bloody stools    Objective:   Physical Exam Neck no masses lungs clear respiratory rate is normal no crackles heart regular no murmurs pulse normal abdomen is soft no guarding or rebound does have large umbilical hernia with some mild tenderness       Assessment & Plan:  Umbilical hernia Severe pain upon palpation Given the pain as well as size needs referral [facility] surgery for removal  Hypothyroidism check TSH await results  Chronic pain continue medication patient to follow-up on regular pain medication visits  Surgery clearance given to the patient-she should be able to undergo the surgery well she is on multiple medicines including pain medicine which could pose some anesthesia issues

## 2018-02-28 LAB — T4, FREE: FREE T4: 1.14 ng/dL (ref 0.82–1.77)

## 2018-02-28 LAB — TSH: TSH: 4.18 u[IU]/mL (ref 0.450–4.500)

## 2018-03-08 ENCOUNTER — Encounter (INDEPENDENT_AMBULATORY_CARE_PROVIDER_SITE_OTHER): Payer: Self-pay

## 2018-03-08 ENCOUNTER — Encounter: Payer: Self-pay | Admitting: Family Medicine

## 2018-03-12 ENCOUNTER — Other Ambulatory Visit: Payer: Self-pay | Admitting: Family Medicine

## 2018-03-21 ENCOUNTER — Other Ambulatory Visit: Payer: Self-pay | Admitting: Family Medicine

## 2018-03-23 DIAGNOSIS — K432 Incisional hernia without obstruction or gangrene: Secondary | ICD-10-CM | POA: Diagnosis not present

## 2018-03-26 ENCOUNTER — Encounter: Payer: Self-pay | Admitting: Family Medicine

## 2018-03-26 ENCOUNTER — Ambulatory Visit: Payer: Medicare Other | Admitting: Family Medicine

## 2018-03-26 VITALS — BP 122/78 | Ht 67.0 in | Wt 186.2 lb

## 2018-03-26 DIAGNOSIS — Z79891 Long term (current) use of opiate analgesic: Secondary | ICD-10-CM

## 2018-03-26 DIAGNOSIS — E038 Other specified hypothyroidism: Secondary | ICD-10-CM

## 2018-03-26 MED ORDER — OXYCODONE HCL 5 MG PO TABS: 5.0000 mg | ORAL_TABLET | ORAL | 0 refills | Status: DC | PRN

## 2018-03-26 NOTE — Progress Notes (Signed)
   Subjective:    Patient ID: Amanda Osborne, female    DOB: [birth year], 67 y.o.   MRN: [id number]  HPI  This patient was seen today for chronic pain  The medication list was reviewed and updated.   -Compliance with medication: yes  - Number patient states they take daily: 6  -when was the last dose patient took? today  The patient was advised the importance of maintaining medication and not using illegal substances with these.  Here for refills and follow up  The patient was educated that we can provide 3 monthly scripts for their medication, it is their responsibility to follow the instructions.  Side effects or complications from medications: none  Patient is aware that pain medications are meant to minimize the severity of the pain to allow their pain levels to improve to allow for better function. They are aware of that pain medications cannot totally remove their pain.  Due for UDT ( at least once per year) : 12/2017  Drug registry was checked prescriptions written  Thyroid patient taking her medication.  Recent lab work reviewed     Review of Systems  Constitutional: Negative for activity change and appetite change.  HENT: Negative for congestion.   Respiratory: Negative for cough.   Cardiovascular: Negative for chest pain.  Gastrointestinal: Negative for abdominal pain and vomiting.  Skin: Negative for color change.  Neurological: Negative for weakness.  Psychiatric/Behavioral: Negative for confusion.       Objective:   Physical Exam  Constitutional: She appears well-nourished. No distress.  Cardiovascular: Normal rate, regular rhythm and normal heart sounds.  No murmur heard. Pulmonary/Chest: Effort normal and breath sounds normal. No respiratory distress.  Musculoskeletal: She exhibits no edema.  Lymphadenopathy:    She has no cervical adenopathy.  Neurological: She is alert. She exhibits normal muscle tone.  Psychiatric: Her behavior is normal.    Vitals reviewed.  Patient does relate that the pain medicine does help her with function       Assessment & Plan:  The patient was seen in followup for chronic pain. A review over at their current pain status was discussed. Discussion was held regarding the importance of compliance with medication as well as pain medication contract. Discussion with the patient regarding the medication as it pertains to allowing for blunting of pain levels as well as improvement of function was completed. Questions regarding the pain management were answered. Importance of regular followup visits was discussed. Patient was informed that medication may cause drowsiness and should not be combined  with other medications/alcohol or street drugs. Patient was cautioned that drowsiness may impair ability to operate heavy machinery/vehicles/dangerous activities and should take this into consideration.  Drug registry was checked patient was cautioned not to take Xanax near the pain medicine Patient does have known psychiatric illness and has been very difficult for her to stop the Xanax.  She denies drowsiness with the medicine  Comprehensive lab work and office visit on follow-up 3 months

## 2018-04-05 ENCOUNTER — Ambulatory Visit: Payer: Self-pay | Admitting: General Surgery

## 2018-04-12 ENCOUNTER — Other Ambulatory Visit: Payer: Self-pay

## 2018-04-12 NOTE — Patient Outreach (Signed)
[facility]) Care Management  04/12/2018  Amanda Osborne 1950-12-16 [id number]   Medication Adherence call to [name] Human patient did not answer. [place] said patient pick up Pravastatin 20 mg on 03/25/18 for a 30 days supply. Patient is showing past due under [place] health Care Ins.  [place] Management Direct Dial [phone]  Fax [phone] [email or web address]

## 2018-04-22 ENCOUNTER — Other Ambulatory Visit: Payer: Self-pay | Admitting: Family Medicine

## 2018-04-30 ENCOUNTER — Other Ambulatory Visit: Payer: Self-pay

## 2018-04-30 NOTE — Patient Outreach (Signed)
[facility]) Care Management  04/30/2018  Amanda Osborne Apr 13, 1951 [id number]   Medication Adherence call to Mrs. [name] left a message for patient to call back patient is due on Pravastatin 20 mg Mrs. Tiannah is showing past due under [place].   [facility] Management Direct Dial [phone]  Fax [phone] [email or web address]

## 2018-05-02 ENCOUNTER — Other Ambulatory Visit: Payer: Self-pay | Admitting: Family Medicine

## 2018-06-04 DIAGNOSIS — E860 Dehydration: Secondary | ICD-10-CM | POA: Diagnosis not present

## 2018-06-04 DIAGNOSIS — R531 Weakness: Secondary | ICD-10-CM | POA: Diagnosis not present

## 2018-06-04 DIAGNOSIS — R0902 Hypoxemia: Secondary | ICD-10-CM | POA: Diagnosis not present

## 2018-06-08 DIAGNOSIS — D229 Melanocytic nevi, unspecified: Secondary | ICD-10-CM | POA: Diagnosis not present

## 2018-06-08 DIAGNOSIS — L821 Other seborrheic keratosis: Secondary | ICD-10-CM | POA: Diagnosis not present

## 2018-06-08 DIAGNOSIS — L814 Other melanin hyperpigmentation: Secondary | ICD-10-CM | POA: Diagnosis not present

## 2018-06-08 NOTE — Patient Instructions (Addendum)
Amanda Osborne  06/08/2018   Your procedure is scheduled on: 06-14-18   Report to [facility] Main  Entrance    Report to admitting at 5:30AM    Call this number if you have problems the morning of surgery [phone]     Remember: NO SOLID FOOD AFTER MIDNIGHT THE NIGHT PRIOR TO SURGERY. NOTHING BY MOUTH EXCEPT CLEAR LIQUIDS UNTIL 3 HOURS PRIOR TO [place] SURGERY. PLEASE FINISH ENSURE DRINK PER SURGEON ORDER 3 HOURS PRIOR TO SCHEDULED SURGERY TIME WHICH NEEDS TO BE COMPLETED AT ____4:30AM________.       CLEAR LIQUID DIET   Foods Allowed                                                                     Foods Excluded  Coffee and tea, regular and decaf                             liquids that you cannot  Plain Jell-O in any flavor                                             see through such as: Fruit ices (not with fruit pulp)                                     milk, soups, orange juice  Iced Popsicles                                    All solid food Carbonated beverages, regular and diet                                    Cranberry, grape and apple juices Sports drinks like Gatorade Lightly seasoned clear broth or consume(fat free) Sugar, honey syrup  Sample Menu Breakfast                                Lunch                                     Supper Cranberry juice                    Beef broth                            Chicken broth Jell-O                                     Grape juice  Apple juice Coffee or tea                        Jell-O                                      Popsicle                                                Coffee or tea                        Coffee or tea  _____________________________________________________________________     Take these medicines the morning of surgery with A SIP OF WATER: XANAX IF NEEDED, NEXIUM, PEPCID, NASAL SPRAY, LEVOTHYROXINE, CLARITIN, OXYCODONE IF NEEDED                                 You may not have any metal on your body including hair pins and              piercings  Do not wear jewelry, make-up, lotions, powders or perfumes, deodorant             Do not wear nail polish.  Do not shave  48 hours prior to surgery.        Do not bring valuables to the hospital. Dent.  Contacts, dentures or bridgework may not be worn into surgery.  Leave suitcase in the car. After surgery it may be brought to your room.                 Please read over the following fact sheets you were given: _____________________________________________________________________             [facility] - Preparing for Surgery Before surgery, you can play an important role.  Because skin is not sterile, your skin needs to be as free of germs as possible.  You can reduce the number of germs on your skin by washing with CHG (chlorahexidine gluconate) soap before surgery.  CHG is an antiseptic cleaner which kills germs and bonds with the skin to continue killing germs even after washing. Please DO NOT use if you have an allergy to CHG or antibacterial soaps.  If your skin becomes reddened/irritated stop using the CHG and inform your nurse when you arrive at Short Stay. Do not shave (including legs and underarms) for at least 48 hours prior to the first CHG shower.  You may shave your face/neck. Please follow these instructions carefully:  1.  Shower with CHG Soap the night before surgery and the  morning of Surgery.  2.  If you choose to wash your hair, wash your hair first as usual with your  normal  shampoo.  3.  After you shampoo, rinse your hair and body thoroughly to remove the  shampoo.                           4.  Use CHG as you would any other liquid soap.  You  can apply chg directly  to the skin and wash                       Gently with a scrungie or clean washcloth.  5.  Apply the CHG Soap to your body ONLY FROM  THE NECK DOWN.   Do not use on face/ open                           Wound or open sores. Avoid contact with eyes, ears mouth and genitals (private parts).                       Wash face,  Genitals (private parts) with your normal soap.             6.  Wash thoroughly, paying special attention to the area where your surgery  will be performed.  7.  Thoroughly rinse your body with warm water from the neck down.  8.  DO NOT shower/wash with your normal soap after using and rinsing off  the CHG Soap.                9.  Pat yourself dry with a clean towel.            10.  Wear clean pajamas.            11.  Place clean sheets on your bed the night of your first shower and do not  sleep with pets. Day of Surgery : Do not apply any lotions/deodorants the morning of surgery.  Please wear clean clothes to the hospital/surgery center.  FAILURE TO FOLLOW THESE INSTRUCTIONS MAY RESULT IN THE CANCELLATION OF YOUR SURGERY PATIENT SIGNATURE_________________________________  NURSE SIGNATURE__________________________________  ________________________________________________________________________

## 2018-06-11 ENCOUNTER — Other Ambulatory Visit: Payer: Self-pay

## 2018-06-11 ENCOUNTER — Encounter (HOSPITAL_COMMUNITY)
Admission: RE | Admit: 2018-06-11 | Discharge: 2018-06-11 | Disposition: A | Discharge status: Home / Self Care | Payer: Medicare Other | Source: Ambulatory Visit | Attending: General Surgery | Admitting: General Surgery

## 2018-06-11 ENCOUNTER — Encounter (HOSPITAL_COMMUNITY): Payer: Self-pay

## 2018-06-11 DIAGNOSIS — F1120 Opioid dependence, uncomplicated: Secondary | ICD-10-CM | POA: Diagnosis not present

## 2018-06-11 DIAGNOSIS — I10 Essential (primary) hypertension: Secondary | ICD-10-CM | POA: Diagnosis not present

## 2018-06-11 DIAGNOSIS — E785 Hyperlipidemia, unspecified: Secondary | ICD-10-CM | POA: Diagnosis not present

## 2018-06-11 DIAGNOSIS — Z79899 Other long term (current) drug therapy: Secondary | ICD-10-CM | POA: Diagnosis not present

## 2018-06-11 DIAGNOSIS — F419 Anxiety disorder, unspecified: Secondary | ICD-10-CM | POA: Diagnosis not present

## 2018-06-11 DIAGNOSIS — E039 Hypothyroidism, unspecified: Secondary | ICD-10-CM | POA: Diagnosis not present

## 2018-06-11 DIAGNOSIS — K432 Incisional hernia without obstruction or gangrene: Secondary | ICD-10-CM | POA: Insufficient documentation

## 2018-06-11 DIAGNOSIS — Z01810 Encounter for preprocedural cardiovascular examination: Secondary | ICD-10-CM

## 2018-06-11 DIAGNOSIS — Z01818 Encounter for other preprocedural examination: Secondary | ICD-10-CM

## 2018-06-11 DIAGNOSIS — D509 Iron deficiency anemia, unspecified: Secondary | ICD-10-CM | POA: Diagnosis not present

## 2018-06-11 DIAGNOSIS — Z9884 Bariatric surgery status: Secondary | ICD-10-CM | POA: Diagnosis not present

## 2018-06-11 DIAGNOSIS — M5481 Occipital neuralgia: Secondary | ICD-10-CM | POA: Diagnosis not present

## 2018-06-11 DIAGNOSIS — G47419 Narcolepsy without cataplexy: Secondary | ICD-10-CM | POA: Diagnosis not present

## 2018-06-11 DIAGNOSIS — Z800 Family history of malignant neoplasm of digestive organs: Secondary | ICD-10-CM | POA: Diagnosis not present

## 2018-06-11 DIAGNOSIS — Z8249 Family history of ischemic heart disease and other diseases of the circulatory system: Secondary | ICD-10-CM | POA: Diagnosis not present

## 2018-06-11 DIAGNOSIS — M797 Fibromyalgia: Secondary | ICD-10-CM | POA: Diagnosis not present

## 2018-06-11 DIAGNOSIS — E559 Vitamin D deficiency, unspecified: Secondary | ICD-10-CM | POA: Diagnosis not present

## 2018-06-11 DIAGNOSIS — Z833 Family history of diabetes mellitus: Secondary | ICD-10-CM | POA: Diagnosis not present

## 2018-06-11 DIAGNOSIS — Z981 Arthrodesis status: Secondary | ICD-10-CM | POA: Diagnosis not present

## 2018-06-11 DIAGNOSIS — Z8489 Family history of other specified conditions: Secondary | ICD-10-CM | POA: Diagnosis not present

## 2018-06-11 DIAGNOSIS — Z888 Allergy status to other drugs, medicaments and biological substances status: Secondary | ICD-10-CM | POA: Diagnosis not present

## 2018-06-11 DIAGNOSIS — K219 Gastro-esophageal reflux disease without esophagitis: Secondary | ICD-10-CM | POA: Diagnosis not present

## 2018-06-11 DIAGNOSIS — R51 Headache: Secondary | ICD-10-CM | POA: Diagnosis not present

## 2018-06-11 DIAGNOSIS — M1990 Unspecified osteoarthritis, unspecified site: Secondary | ICD-10-CM | POA: Diagnosis not present

## 2018-06-11 LAB — CBC WITH DIFFERENTIAL/PLATELET
Basophils Absolute: 0 10*3/uL (ref 0.0–0.1)
Basophils Relative: 0 %
EOS PCT: 7 %
Eosinophils Absolute: 0.3 10*3/uL (ref 0.0–0.7)
HCT: 44.5 % (ref 36.0–46.0)
Hemoglobin: 14.7 g/dL (ref 12.0–15.0)
LYMPHS ABS: 1.4 10*3/uL (ref 0.7–4.0)
LYMPHS PCT: 27 %
MCH: 29.3 pg (ref 26.0–34.0)
MCHC: 33 g/dL (ref 30.0–36.0)
MCV: 88.8 fL (ref 78.0–100.0)
MONO ABS: 0.3 10*3/uL (ref 0.1–1.0)
MONOS PCT: 6 %
Neutro Abs: 3.1 10*3/uL (ref 1.7–7.7)
Neutrophils Relative %: 60 %
PLATELETS: 343 10*3/uL (ref 150–400)
RBC: 5.01 MIL/uL (ref 3.87–5.11)
RDW: 13.4 % (ref 11.5–15.5)
WBC: 5.1 10*3/uL (ref 4.0–10.5)

## 2018-06-11 LAB — BASIC METABOLIC PANEL
Anion gap: 8 (ref 5–15)
BUN: 24 mg/dL — AB (ref 8–23)
CHLORIDE: 104 mmol/L (ref 98–111)
CO2: 28 mmol/L (ref 22–32)
Calcium: 9.3 mg/dL (ref 8.9–10.3)
Creatinine, Ser: 0.57 mg/dL (ref 0.44–1.00)
GFR calc Af Amer: >60 mL/min (ref >60)
GFR calc non Af Amer: >60 mL/min (ref >60)
GLUCOSE: 85 mg/dL (ref 70–99)
POTASSIUM: 4.5 mmol/L (ref 3.5–5.1)
Sodium: 140 mmol/L (ref 135–145)

## 2018-06-13 NOTE — Anesthesia Preprocedure Evaluation (Addendum)
Anesthesia Evaluation  Patient identified by MRN, date of birth, ID band Patient awake    Reviewed: Allergy & Precautions, NPO status , Patient's Chart, lab work & pertinent test results  History of Anesthesia Complications (+) PONV  Airway Mallampati: II  TM Distance: >3 FB Neck ROM: Full    Dental  (+) Dental Advisory Given   Pulmonary neg pulmonary ROS,    breath sounds clear to auscultation       Cardiovascular hypertension, Pt. on medications (-) angina Rhythm:Regular Rate:Normal     Neuro/Psych  Headaches, Anxiety    GI/Hepatic Neg liver ROS, GERD  Medicated and Controlled,H/o gastric bypass   Endo/Other  Hypothyroidism   Renal/GU negative Renal ROS     Musculoskeletal  (+) Fibromyalgia -, narcotic dependent  Abdominal   Peds  Hematology negative hematology ROS (+)   Anesthesia Other Findings   Reproductive/Obstetrics                            Anesthesia Physical Anesthesia Plan  ASA: III  Anesthesia Plan: General   Post-op Pain Management:    Induction: Intravenous  PONV Risk Score and Plan: 4 or greater and Ondansetron, Dexamethasone and Scopolamine patch - Pre-op  Airway Management Planned: Oral ETT  Additional Equipment:   Intra-op Plan:   Post-operative Plan: Extubation in OR  Informed Consent: I have reviewed the patients History and Physical, chart, labs and discussed the procedure including the risks, benefits and alternatives for the proposed anesthesia with the patient or authorized representative who has indicated his/her understanding and acceptance.   Dental advisory given  Plan Discussed with: CRNA and Surgeon  Anesthesia Plan Comments: (Plan routine monitors, GETA)        Anesthesia Quick Evaluation

## 2018-06-14 ENCOUNTER — Inpatient Hospital Stay (HOSPITAL_COMMUNITY): Payer: Medicare Other | Admitting: Anesthesiology

## 2018-06-14 ENCOUNTER — Encounter (HOSPITAL_COMMUNITY)
Admission: RE | Disposition: A | Discharge status: Home / Self Care | Payer: Self-pay | Source: Ambulatory Visit | Attending: General Surgery

## 2018-06-14 ENCOUNTER — Encounter (HOSPITAL_COMMUNITY): Payer: Self-pay | Admitting: Certified Registered Nurse Anesthetist

## 2018-06-14 ENCOUNTER — Other Ambulatory Visit: Payer: Self-pay

## 2018-06-14 ENCOUNTER — Observation Stay (HOSPITAL_COMMUNITY)
Admission: RE | Admit: 2018-06-14 | Discharge: 2018-06-15 | Disposition: A | Discharge status: Home / Self Care | Payer: Medicare Other | Source: Ambulatory Visit | Attending: General Surgery | Admitting: General Surgery

## 2018-06-14 DIAGNOSIS — Z833 Family history of diabetes mellitus: Secondary | ICD-10-CM | POA: Insufficient documentation

## 2018-06-14 DIAGNOSIS — K432 Incisional hernia without obstruction or gangrene: Secondary | ICD-10-CM | POA: Diagnosis not present

## 2018-06-14 DIAGNOSIS — Z8249 Family history of ischemic heart disease and other diseases of the circulatory system: Secondary | ICD-10-CM | POA: Insufficient documentation

## 2018-06-14 DIAGNOSIS — G47419 Narcolepsy without cataplexy: Secondary | ICD-10-CM | POA: Insufficient documentation

## 2018-06-14 DIAGNOSIS — M5481 Occipital neuralgia: Secondary | ICD-10-CM | POA: Diagnosis not present

## 2018-06-14 DIAGNOSIS — M1990 Unspecified osteoarthritis, unspecified site: Secondary | ICD-10-CM | POA: Diagnosis not present

## 2018-06-14 DIAGNOSIS — E785 Hyperlipidemia, unspecified: Secondary | ICD-10-CM | POA: Diagnosis not present

## 2018-06-14 DIAGNOSIS — F419 Anxiety disorder, unspecified: Secondary | ICD-10-CM | POA: Insufficient documentation

## 2018-06-14 DIAGNOSIS — M797 Fibromyalgia: Secondary | ICD-10-CM | POA: Insufficient documentation

## 2018-06-14 DIAGNOSIS — Z8489 Family history of other specified conditions: Secondary | ICD-10-CM | POA: Insufficient documentation

## 2018-06-14 DIAGNOSIS — Z981 Arthrodesis status: Secondary | ICD-10-CM | POA: Diagnosis not present

## 2018-06-14 DIAGNOSIS — Z79899 Other long term (current) drug therapy: Secondary | ICD-10-CM | POA: Insufficient documentation

## 2018-06-14 DIAGNOSIS — K219 Gastro-esophageal reflux disease without esophagitis: Secondary | ICD-10-CM | POA: Insufficient documentation

## 2018-06-14 DIAGNOSIS — Z800 Family history of malignant neoplasm of digestive organs: Secondary | ICD-10-CM | POA: Diagnosis not present

## 2018-06-14 DIAGNOSIS — F1120 Opioid dependence, uncomplicated: Secondary | ICD-10-CM | POA: Insufficient documentation

## 2018-06-14 DIAGNOSIS — Z9884 Bariatric surgery status: Secondary | ICD-10-CM | POA: Insufficient documentation

## 2018-06-14 DIAGNOSIS — Z888 Allergy status to other drugs, medicaments and biological substances status: Secondary | ICD-10-CM | POA: Diagnosis not present

## 2018-06-14 DIAGNOSIS — E039 Hypothyroidism, unspecified: Secondary | ICD-10-CM | POA: Diagnosis not present

## 2018-06-14 DIAGNOSIS — D509 Iron deficiency anemia, unspecified: Secondary | ICD-10-CM | POA: Insufficient documentation

## 2018-06-14 DIAGNOSIS — R51 Headache: Secondary | ICD-10-CM | POA: Insufficient documentation

## 2018-06-14 DIAGNOSIS — I10 Essential (primary) hypertension: Secondary | ICD-10-CM | POA: Insufficient documentation

## 2018-06-14 DIAGNOSIS — E559 Vitamin D deficiency, unspecified: Secondary | ICD-10-CM | POA: Diagnosis not present

## 2018-06-14 HISTORY — PX: INCISIONAL HERNIA REPAIR: SHX193

## 2018-06-14 LAB — CBC
HCT: 38.4 % (ref 36.0–46.0)
HEMOGLOBIN: 12.6 g/dL (ref 12.0–15.0)
MCH: 29.2 pg (ref 26.0–34.0)
MCHC: 32.8 g/dL (ref 30.0–36.0)
MCV: 88.9 fL (ref 78.0–100.0)
Platelets: 242 10*3/uL (ref 150–400)
RBC: 4.32 MIL/uL (ref 3.87–5.11)
RDW: 13.3 % (ref 11.5–15.5)
WBC: 8.8 10*3/uL (ref 4.0–10.5)

## 2018-06-14 LAB — CREATININE, SERUM
CREATININE: 0.6 mg/dL (ref 0.44–1.00)
GFR calc non Af Amer: >60 mL/min (ref >60)

## 2018-06-14 LAB — PROCALCITONIN: Procalcitonin: <0.1 ng/mL

## 2018-06-14 SURGERY — REPAIR, HERNIA, INCISIONAL, LAPAROSCOPIC
Anesthesia: General

## 2018-06-14 MED ORDER — PROPOFOL 10 MG/ML IV BOLUS
INTRAVENOUS | Status: DC | PRN
  Administered 2018-06-14: 100 mg via INTRAVENOUS

## 2018-06-14 MED ORDER — LIDOCAINE 2% (20 MG/ML) 5 ML SYRINGE
INTRAMUSCULAR | Status: AC
  Filled 2018-06-14: qty 5

## 2018-06-14 MED ORDER — BIOTIN 5 MG PO CAPS: 5.0000 mg | ORAL_CAPSULE | Freq: Three times a day (TID) | ORAL | Status: DC

## 2018-06-14 MED ORDER — HYDROCODONE-ACETAMINOPHEN 5-325 MG PO TABS
1.0000 | ORAL_TABLET | ORAL | Status: DC | PRN
  Administered 2018-06-14 – 2018-06-15 (×4): 2 via ORAL
  Filled 2018-06-14 (×4): qty 2

## 2018-06-14 MED ORDER — SIMETHICONE 80 MG PO CHEW: 40.0000 mg | CHEWABLE_TABLET | Freq: Four times a day (QID) | ORAL | Status: DC | PRN

## 2018-06-14 MED ORDER — POLYETHYLENE GLYCOL 3350 17 GM/SCOOP PO POWD: 17.0000 g | Freq: Every day | ORAL | Status: DC

## 2018-06-14 MED ORDER — ROCURONIUM BROMIDE 10 MG/ML (PF) SYRINGE
PREFILLED_SYRINGE | INTRAVENOUS | Status: AC
  Filled 2018-06-14: qty 10

## 2018-06-14 MED ORDER — FENTANYL CITRATE (PF) 100 MCG/2ML IJ SOLN
INTRAMUSCULAR | Status: DC | PRN
  Administered 2018-06-14 (×8): 50 ug via INTRAVENOUS

## 2018-06-14 MED ORDER — HYDROMORPHONE HCL 1 MG/ML IJ SOLN
INTRAMUSCULAR | Status: AC
  Filled 2018-06-14: qty 1

## 2018-06-14 MED ORDER — ONDANSETRON 4 MG PO TBDP: 4.0000 mg | ORAL_TABLET | Freq: Four times a day (QID) | ORAL | Status: DC | PRN

## 2018-06-14 MED ORDER — MORPHINE SULFATE (PF) 2 MG/ML IV SOLN
2.0000 mg | INTRAVENOUS | Status: DC | PRN
  Administered 2018-06-14: 2 mg via INTRAVENOUS
  Filled 2018-06-14: qty 1

## 2018-06-14 MED ORDER — LIDOCAINE 2% (20 MG/ML) 5 ML SYRINGE
INTRAMUSCULAR | Status: DC | PRN
  Administered 2018-06-14: 1.5 mg/kg/h via INTRAVENOUS

## 2018-06-14 MED ORDER — ONDANSETRON HCL 4 MG/2ML IJ SOLN
INTRAMUSCULAR | Status: AC
  Filled 2018-06-14: qty 2

## 2018-06-14 MED ORDER — CHLORHEXIDINE GLUCONATE 4 % EX LIQD: 60.0000 mL | Freq: Once | CUTANEOUS | Status: DC

## 2018-06-14 MED ORDER — PRAVASTATIN SODIUM 20 MG PO TABS: 40.0000 mg | ORAL_TABLET | Freq: Every day | ORAL | Status: DC

## 2018-06-14 MED ORDER — HYDRALAZINE HCL 20 MG/ML IJ SOLN: 10.0000 mg | INTRAMUSCULAR | Status: DC | PRN

## 2018-06-14 MED ORDER — TRIAMCINOLONE ACETONIDE 0.1 % EX CREA: 1.0000 "application " | TOPICAL_CREAM | Freq: Every day | CUTANEOUS | Status: DC | PRN

## 2018-06-14 MED ORDER — DEXAMETHASONE SODIUM PHOSPHATE 4 MG/ML IJ SOLN
INTRAMUSCULAR | Status: DC | PRN
  Administered 2018-06-14: 5 mg via INTRAVENOUS

## 2018-06-14 MED ORDER — POLYETHYLENE GLYCOL 3350 17 G PO PACK: 17.0000 g | PACK | Freq: Every day | ORAL | Status: DC

## 2018-06-14 MED ORDER — KETAMINE HCL 10 MG/ML IJ SOLN
INTRAMUSCULAR | Status: AC
  Filled 2018-06-14: qty 1

## 2018-06-14 MED ORDER — ENSURE PRE-SURGERY PO LIQD
296.0000 mL | Freq: Once | ORAL | Status: DC
  Filled 2018-06-14: qty 296

## 2018-06-14 MED ORDER — VITAMIN B-12 1000 MCG PO TABS
3000.0000 ug | ORAL_TABLET | Freq: Three times a day (TID) | ORAL | Status: DC
  Administered 2018-06-14: 3000 ug via ORAL
  Filled 2018-06-14: qty 3

## 2018-06-14 MED ORDER — FENTANYL CITRATE (PF) 100 MCG/2ML IJ SOLN
INTRAMUSCULAR | Status: AC
  Filled 2018-06-14: qty 2

## 2018-06-14 MED ORDER — MIDAZOLAM HCL 2 MG/2ML IJ SOLN
INTRAMUSCULAR | Status: AC
  Filled 2018-06-14: qty 2

## 2018-06-14 MED ORDER — TIZANIDINE HCL 4 MG PO TABS: 4.0000 mg | ORAL_TABLET | Freq: Three times a day (TID) | ORAL | Status: DC | PRN

## 2018-06-14 MED ORDER — ENOXAPARIN SODIUM 40 MG/0.4ML ~~LOC~~ SOLN
40.0000 mg | SUBCUTANEOUS | Status: DC
  Administered 2018-06-15: 40 mg via SUBCUTANEOUS
  Filled 2018-06-14: qty 0.4

## 2018-06-14 MED ORDER — FENTANYL CITRATE (PF) 100 MCG/2ML IJ SOLN: 50.0000 ug | INTRAMUSCULAR | Status: DC | PRN

## 2018-06-14 MED ORDER — MEPERIDINE HCL 50 MG/ML IJ SOLN
6.2500 mg | INTRAMUSCULAR | Status: DC | PRN
Start: 2018-06-14 — End: 2018-06-14

## 2018-06-14 MED ORDER — KETAMINE HCL 10 MG/ML IJ SOLN
INTRAMUSCULAR | Status: DC | PRN
  Administered 2018-06-14: 30 mg via INTRAVENOUS

## 2018-06-14 MED ORDER — DIPHENHYDRAMINE HCL 12.5 MG/5ML PO ELIX: 12.5000 mg | ORAL_SOLUTION | Freq: Four times a day (QID) | ORAL | Status: DC | PRN

## 2018-06-14 MED ORDER — PROMETHAZINE HCL 25 MG/ML IJ SOLN: 6.2500 mg | INTRAMUSCULAR | Status: DC | PRN

## 2018-06-14 MED ORDER — BUPIVACAINE-EPINEPHRINE 0.25% -1:200000 IJ SOLN
INTRAMUSCULAR | Status: DC | PRN
  Administered 2018-06-14: 30 mL

## 2018-06-14 MED ORDER — MIDAZOLAM HCL 2 MG/2ML IJ SOLN: 0.5000 mg | Freq: Once | INTRAMUSCULAR | Status: DC | PRN

## 2018-06-14 MED ORDER — DIPHENHYDRAMINE HCL 50 MG/ML IJ SOLN: 12.5000 mg | Freq: Four times a day (QID) | INTRAMUSCULAR | Status: DC | PRN

## 2018-06-14 MED ORDER — PHENYLEPHRINE 40 MCG/ML (10ML) SYRINGE FOR IV PUSH (FOR BLOOD PRESSURE SUPPORT)
PREFILLED_SYRINGE | INTRAVENOUS | Status: AC
  Filled 2018-06-14: qty 10

## 2018-06-14 MED ORDER — ACETAMINOPHEN 500 MG PO TABS
1000.0000 mg | ORAL_TABLET | ORAL | Status: AC
  Administered 2018-06-14: 1000 mg via ORAL
  Filled 2018-06-14: qty 2

## 2018-06-14 MED ORDER — MIDAZOLAM HCL 5 MG/5ML IJ SOLN
INTRAMUSCULAR | Status: DC | PRN
  Administered 2018-06-14: 2 mg via INTRAVENOUS

## 2018-06-14 MED ORDER — VITAMIN D 1000 UNITS PO TABS
2000.0000 [IU] | ORAL_TABLET | Freq: Three times a day (TID) | ORAL | Status: DC
  Administered 2018-06-14: 2000 [IU] via ORAL
  Filled 2018-06-14: qty 2

## 2018-06-14 MED ORDER — CEFAZOLIN SODIUM-DEXTROSE 2-4 GM/100ML-% IV SOLN
2.0000 g | INTRAVENOUS | Status: AC
  Administered 2018-06-14: 2 g via INTRAVENOUS
  Filled 2018-06-14: qty 100

## 2018-06-14 MED ORDER — ADULT MULTIVITAMIN LIQUID CH
15.0000 mL | Freq: Every day | ORAL | Status: DC
  Filled 2018-06-14: qty 15

## 2018-06-14 MED ORDER — ROCURONIUM BROMIDE 10 MG/ML (PF) SYRINGE
PREFILLED_SYRINGE | INTRAVENOUS | Status: DC | PRN
  Administered 2018-06-14: 50 mg via INTRAVENOUS
  Administered 2018-06-14: 10 mg via INTRAVENOUS

## 2018-06-14 MED ORDER — 0.9 % SODIUM CHLORIDE (POUR BTL) OPTIME
TOPICAL | Status: DC | PRN
  Administered 2018-06-14: 1000 mL

## 2018-06-14 MED ORDER — LORATADINE 10 MG PO TABS: 10.0000 mg | ORAL_TABLET | Freq: Every day | ORAL | Status: DC | PRN

## 2018-06-14 MED ORDER — SODIUM CHLORIDE 0.9 % IV SOLN
INTRAVENOUS | Status: DC
  Administered 2018-06-14 – 2018-06-15 (×2): via INTRAVENOUS

## 2018-06-14 MED ORDER — DEXAMETHASONE SODIUM PHOSPHATE 10 MG/ML IJ SOLN
INTRAMUSCULAR | Status: AC
  Filled 2018-06-14: qty 1

## 2018-06-14 MED ORDER — HYDROCHLOROTHIAZIDE 25 MG PO TABS: 25.0000 mg | ORAL_TABLET | Freq: Every day | ORAL | Status: DC

## 2018-06-14 MED ORDER — SCOPOLAMINE 1 MG/3DAYS TD PT72
1.0000 | MEDICATED_PATCH | Freq: Once | TRANSDERMAL | Status: DC
  Administered 2018-06-14: 1.5 mg via TRANSDERMAL
  Filled 2018-06-14: qty 1

## 2018-06-14 MED ORDER — HYDROMORPHONE HCL 1 MG/ML IJ SOLN
0.2500 mg | INTRAMUSCULAR | Status: DC | PRN
  Administered 2018-06-14 (×2): 0.5 mg via INTRAVENOUS

## 2018-06-14 MED ORDER — LISINOPRIL 5 MG PO TABS
2.5000 mg | ORAL_TABLET | Freq: Every day | ORAL | Status: DC
  Administered 2018-06-14: 2.5 mg via ORAL
  Filled 2018-06-14: qty 1

## 2018-06-14 MED ORDER — SUGAMMADEX SODIUM 200 MG/2ML IV SOLN
INTRAVENOUS | Status: DC | PRN
  Administered 2018-06-14: 200 mg via INTRAVENOUS

## 2018-06-14 MED ORDER — LIDOCAINE HCL 2 % IJ SOLN
INTRAMUSCULAR | Status: AC
  Filled 2018-06-14: qty 20

## 2018-06-14 MED ORDER — KETOROLAC TROMETHAMINE 15 MG/ML IJ SOLN
15.0000 mg | Freq: Four times a day (QID) | INTRAMUSCULAR | Status: DC
  Administered 2018-06-14 – 2018-06-15 (×3): 15 mg via INTRAVENOUS
  Filled 2018-06-14 (×3): qty 1

## 2018-06-14 MED ORDER — LIDOCAINE 2% (20 MG/ML) 5 ML SYRINGE
INTRAMUSCULAR | Status: DC | PRN
  Administered 2018-06-14: 50 mg via INTRAVENOUS

## 2018-06-14 MED ORDER — ALPRAZOLAM 0.5 MG PO TABS: 0.5000 mg | ORAL_TABLET | Freq: Every evening | ORAL | Status: DC | PRN

## 2018-06-14 MED ORDER — LACTATED RINGERS IV SOLN
INTRAVENOUS | Status: DC
  Administered 2018-06-14: 06:00:00 via INTRAVENOUS

## 2018-06-14 MED ORDER — PROPOFOL 10 MG/ML IV BOLUS
INTRAVENOUS | Status: AC
  Filled 2018-06-14: qty 20

## 2018-06-14 MED ORDER — FLUTICASONE PROPIONATE 50 MCG/ACT NA SUSP
1.0000 | Freq: Every day | NASAL | Status: DC
  Filled 2018-06-14: qty 16

## 2018-06-14 MED ORDER — LEVOTHYROXINE SODIUM 25 MCG PO TABS
25.0000 ug | ORAL_TABLET | Freq: Every day | ORAL | Status: DC
  Administered 2018-06-15: 25 ug via ORAL
  Filled 2018-06-14: qty 1

## 2018-06-14 MED ORDER — PHENYLEPHRINE 40 MCG/ML (10ML) SYRINGE FOR IV PUSH (FOR BLOOD PRESSURE SUPPORT)
PREFILLED_SYRINGE | INTRAVENOUS | Status: DC | PRN
  Administered 2018-06-14: 80 ug via INTRAVENOUS

## 2018-06-14 MED ORDER — BUPIVACAINE-EPINEPHRINE (PF) 0.25% -1:200000 IJ SOLN
INTRAMUSCULAR | Status: AC
  Filled 2018-06-14: qty 30

## 2018-06-14 MED ORDER — PANTOPRAZOLE SODIUM 40 MG PO TBEC: 40.0000 mg | DELAYED_RELEASE_TABLET | Freq: Every day | ORAL | Status: DC

## 2018-06-14 MED ORDER — SUGAMMADEX SODIUM 200 MG/2ML IV SOLN
INTRAVENOUS | Status: AC
  Filled 2018-06-14: qty 2

## 2018-06-14 MED ORDER — ONDANSETRON HCL 4 MG/2ML IJ SOLN
INTRAMUSCULAR | Status: DC | PRN
  Administered 2018-06-14: 4 mg via INTRAVENOUS

## 2018-06-14 MED ORDER — ONDANSETRON HCL 4 MG/2ML IJ SOLN: 4.0000 mg | Freq: Four times a day (QID) | INTRAMUSCULAR | Status: DC | PRN

## 2018-06-14 SURGICAL SUPPLY — 40 items
APPLIER CLIP 5 13 M/L LIGAMAX5 (MISCELLANEOUS)
BANDAGE ADH SHEER 1  50/CT (GAUZE/BANDAGES/DRESSINGS) ×3 IMPLANT
BENZOIN TINCTURE PRP APPL 2/3 (GAUZE/BANDAGES/DRESSINGS) ×3 IMPLANT
BINDER ABDOMINAL 12 ML 46-62 (SOFTGOODS) ×3 IMPLANT
CABLE HIGH FREQUENCY MONO STRZ (ELECTRODE) ×3 IMPLANT
CHLORAPREP W/TINT 26ML (MISCELLANEOUS) ×3 IMPLANT
CLIP APPLIE 5 13 M/L LIGAMAX5 (MISCELLANEOUS) IMPLANT
CLOSURE WOUND 1/2 X4 (GAUZE/BANDAGES/DRESSINGS) ×1
COVER SURGICAL LIGHT HANDLE (MISCELLANEOUS) ×3 IMPLANT
DECANTER SPIKE VIAL GLASS SM (MISCELLANEOUS) ×3 IMPLANT
DEVICE SECURE STRAP 25 ABSORB (INSTRUMENTS) ×3 IMPLANT
DRAIN CHANNEL 19F RND (DRAIN) IMPLANT
ELECT REM PT RETURN 15FT ADLT (MISCELLANEOUS) ×3 IMPLANT
EVACUATOR SILICONE 100CC (DRAIN) IMPLANT
GLOVE BIOGEL PI IND STRL 7.0 (GLOVE) ×1 IMPLANT
GLOVE BIOGEL PI INDICATOR 7.0 (GLOVE) ×2
GLOVE SURG SS PI 7.0 STRL IVOR (GLOVE) ×3 IMPLANT
GOWN STRL REUS W/TWL LRG LVL3 (GOWN DISPOSABLE) ×3 IMPLANT
GOWN STRL REUS W/TWL XL LVL3 (GOWN DISPOSABLE) ×6 IMPLANT
GRASPER SUT TROCAR 14GX15 (MISCELLANEOUS) ×3 IMPLANT
IRRIG SUCT STRYKERFLOW 2 WTIP (MISCELLANEOUS)
IRRIGATION SUCT STRKRFLW 2 WTP (MISCELLANEOUS) IMPLANT
KIT BASIN OR (CUSTOM PROCEDURE TRAY) ×3 IMPLANT
MESH VENTRALIGHT ST 6X8 (Mesh Specialty) ×2 IMPLANT
MESH VENTRLGHT ELLIPSE 8X6XMFL (Mesh Specialty) ×1 IMPLANT
NEEDLE SPNL 22GX3.5 QUINCKE BK (NEEDLE) ×3 IMPLANT
SCISSORS LAP 5X35 DISP (ENDOMECHANICALS) ×3 IMPLANT
SHEARS HARMONIC ACE PLUS 36CM (ENDOMECHANICALS) IMPLANT
SLEEVE XCEL OPT CAN 5 100 (ENDOMECHANICALS) ×3 IMPLANT
STRIP CLOSURE SKIN 1/2X4 (GAUZE/BANDAGES/DRESSINGS) ×2 IMPLANT
SUT ETHILON 2 0 PS N (SUTURE) IMPLANT
SUT MNCRL AB 4-0 PS2 18 (SUTURE) ×3 IMPLANT
SUT NOVA NAB GS-21 0 18 T12 DT (SUTURE) ×6 IMPLANT
SUT PDS AB 0 CT 36 (SUTURE) ×6 IMPLANT
SUT VICRYL 0 UR6 27IN ABS (SUTURE) IMPLANT
TOWEL OR 17X26 10 PK STRL BLUE (TOWEL DISPOSABLE) ×3 IMPLANT
TRAY LAPAROSCOPIC (CUSTOM PROCEDURE TRAY) ×3 IMPLANT
TROCAR BLADELESS OPT 5 100 (ENDOMECHANICALS) ×3 IMPLANT
TROCAR XCEL 12X100 BLDLESS (ENDOMECHANICALS) ×3 IMPLANT
TUBING INSUF HEATED (TUBING) ×3 IMPLANT

## 2018-06-14 NOTE — Anesthesia Procedure Notes (Signed)
Procedure Name: Intubation Date/Time: 06/14/2018 7:37 AM Performed by: Claudia Desanctis, CRNA Pre-anesthesia Checklist: Patient identified, Emergency Drugs available, Suction available and Patient being monitored Patient Re-evaluated:Patient Re-evaluated prior to induction Oxygen Delivery Method: Circle system utilized Preoxygenation: Pre-oxygenation with 100% oxygen Induction Type: IV induction Ventilation: Mask ventilation without difficulty Laryngoscope Size: 2 and Miller Grade View: Grade I Tube type: Oral Tube size: 7.0 mm Number of attempts: 1 Airway Equipment and Method: Stylet Placement Confirmation: ETT inserted through vocal cords under direct vision,  positive ETCO2 and breath sounds checked- equal and bilateral Secured at: 21 cm Tube secured with: Tape Dental Injury: Teeth and Oropharynx as per pre-operative assessment

## 2018-06-14 NOTE — Op Note (Signed)
Preoperative diagnosis: incisional hernia without obstruction or gangrene  Postoperative diagnosis: Same   Procedure: laparoscopic incisional hernia repair with placement of underlay mesh  Surgeon: Gurney Maxin, M.D.  Asst: none  Anesthesia: Gen.   Indications for procedure: Amanda Osborne is a 67 y.o. female with symptoms of abdominal pain and hernia incarcerated on exam.  Description of procedure: The patient was brought into the operative suite, placed supine. Anesthesia was administered with endotracheal tube. Patient was strapped in place and foot board was secured. Both arms were tucked. All pressure points were offloaded by foam padding. The patient was prepped and draped in the usual sterile fashion.  A small incision was made over the left subcostal area and 3mm trocar was placed with optical entry. Pneumoperitoneum was applied with high flow low pressure. The abdominal cavity was inspected and there were a large amount of filmy adhesions of omentum to the abdominal wall. 1 30mm trocar was placed in the mid left abdomen and 3 additional 26mm trocars 1 in the LLQ. All trocar sites were first anesthestized with 0.25% marcaine with epi and trocars were placed under direct vision.  Blunt dissection was used to remove most of the filmy adhesions with occasional sharp dissection. This dissection lasted 20 minutes. Cautery was used to provide hemostasis. The hernia was identified and was filled with omentum. It was slowly dissected free and reduced. The defect was about 4 cm in diameter. A 15x20 ventralight mesh was inserted and used to the repair the mesh. 8 transfascial 0 polypropylene sutures were used to secure the mesh in place and absorbable tackers were used to appose the mesh against the abdominal wall in all areas in a double crown technique.  The abdominal contents were again inspected and hemostasis was intact.  0 vicryl was used to close the fascial defect of the 38mm trocar  site using suture passer. Pneumoperitoneum was removed, all trocar were removed. All incisions were closed with 4-0 monocryl subcuticular stitch. The patient woke from anesthesia and was brought to PACU in stable condition.  Findings: 4cm hernia  Specimen: none  Blood loss: 30 ml  Local anesthesia: 30 ml 0.9% marcaine  Complications: none  Implant: 15x20cm ventralight ST mesh  Images:       Gurney Maxin, M.D. General, Bariatric, & Minimally Invasive Surgery [facility]

## 2018-06-14 NOTE — Transfer of Care (Signed)
Immediate Anesthesia Transfer of Care Note  Patient: Amanda Osborne  Procedure(s) Performed: LAPAROSCOPIC INCISIONAL HERNIA REPAIR WITH INSERTION OF MESH ERAS PATHWAY (N/A )  Patient Location: PACU  Anesthesia Type:General  Level of Consciousness: awake, alert , oriented and patient cooperative  Airway & Oxygen Therapy: Patient Spontanous Breathing and Patient connected to face mask  Post-op Assessment: Report given to RN and Post -op Vital signs reviewed and stable  Post vital signs: Reviewed and stable   Last Vitals:  Vitals Value Taken Time  BP 127/72 06/14/2018  9:25 AM  Temp    Pulse 73 06/14/2018  9:25 AM  Resp 19 06/14/2018  9:25 AM  SpO2 100 % 06/14/2018  9:25 AM  Vitals shown include unvalidated device data.  Last Pain:  Vitals:   06/14/18 0535  TempSrc: Oral         Complications: No apparent anesthesia complications

## 2018-06-14 NOTE — H&P (Signed)
Amanda Osborne is an 67 y.o. female.   Chief Complaint: abdominal pain HPI: 67 yo female with abdominal pain and findings of incisional hernia.  Past Medical History:  Diagnosis Date  . Anxiety   . Arthritis   . Cervical disc herniation    c5 and c6  . Chronic pain   . Complication of anesthesia   . Fibromyalgia   . GERD (gastroesophageal reflux disease)   . Headache(784.0)    Hx: of migraines until age 22's  . Hyperlipidemia   . Hypertension   . Iron deficiency anemia 11/30/2012  . Narcolepsy   . Occipital neuralgia   . PONV (postoperative nausea and vomiting)    DURING FIRST NECK SURGERY ; BUT REPORT IT MAY HAVE BEEN THE DILAUDID THAT CAUSED THE VOITING I  . Unspecified vitamin D deficiency 03/26/2013  . Wrist fracture, bilateral 2005 and 2010   right then left    Past Surgical History:  Procedure Laterality Date  . ANTERIOR CERVICAL DECOMP/DISCECTOMY FUSION  2000   C6-C7 DR Ellene Route   . ANTERIOR CERVICAL DECOMP/DISCECTOMY FUSION N/A 08/05/2013   Procedure: Cervical five-six Anterior cervical decompression/diskectomy/fusion/ Synthes plate removal;  Surgeon: Kristeen Miss, MD;  Location: [facility];  Service: Neurosurgery;  Laterality: N/A;  . BONE GRAFT HIP ILIAC CREST  2006   to right wrist to correct fracture, DR. [facility]   . COLONOSCOPY     Hx; of  . DILATION AND CURETTAGE OF UTERUS    . ESOPHAGEAL DILATION N/A 10/22/2015   Procedure: ESOPHAGEAL DILATION;  Surgeon: Rogene Houston, MD;  Location: [facility];  Service: Endoscopy;  Laterality: N/A;  . ESOPHAGOGASTRODUODENOSCOPY N/A 08/28/2015   Procedure: ESOPHAGOGASTRODUODENOSCOPY (EGD);  Surgeon: Rogene Houston, MD;  Location: [facility];  Service: Endoscopy;  Laterality: N/A;  910am  . ESOPHAGOGASTRODUODENOSCOPY N/A 10/22/2015   Procedure: ESOPHAGOGASTRODUODENOSCOPY (EGD);  Surgeon: Rogene Houston, MD;  Location: [facility];  Service: Endoscopy;  Laterality: N/A;  12:00  . FRACTURE SURGERY     Hx: of  right wrist surgery  . GASTRIC BYPASS  1981  . TONSILLECTOMY      Family History  Problem Relation Age of Onset  . Stroke Mother   . Heart disease Mother   . Diabetes Mother   . Heart disease Father   . Cancer - Colon Father   . Other Brother    Social History:  reports that she has never smoked. She has never used smokeless tobacco. She reports that she does not drink alcohol or use drugs.  Allergies:  Allergies  Allergen Reactions  . Celexa [Citalopram Hydrobromide] Nausea And Vomiting  . Fosamax [Alendronate Sodium]     Esophagitis   . Neurontin [Gabapentin]     Drowsy   . Soma [Carisoprodol]     dizzy    Medications Prior to Admission  Medication Sig Dispense Refill  . ALPRAZolam (XANAX) 0.5 MG tablet TAKE ONE TABLET TWICE DAILY AS NEEDED (Patient taking differently: TAKE ONE TABLET TWICE DAILY AS NEEDED FOR ANXIETY) 60 tablet 5  . Biotin (BIOTIN 5000) 5 MG CAPS Take 5 mg by mouth 3 (three) times daily.    . Cholecalciferol (VITAMIN D) 2000 units tablet Take 2,000 Units by mouth 3 (three) times daily.    . Cyanocobalamin (B-12) 3000 MCG CAPS Take 3,000 mcg by mouth 3 (three) times daily.    Marland Kitchen esomeprazole (NEXIUM) 20 MG capsule Take 20 mg by mouth daily.     . famotidine (  PEPCID) 20 MG tablet take 1 tablet by mouth twice a day (Patient taking differently: Take 20 mg by mouth once daily as needed for heartburn) 60 tablet 12  . Ferrous Fumarate-Vitamin C ER (FERRO-SEQUELS) 65-25 MG TBCR Take 1 tablet by mouth daily.     . fluticasone (FLONASE) 50 MCG/ACT nasal spray INSTILL 2 SPRAYS INTO EACH NOSTRIL ONCE DAILY (Patient taking differently: INSTILL 2 SPRAYS INTO EACH NOSTRIL ONCE DAILY AS NEEDED FOR ALLERGIES) 16 g 5  . hydrochlorothiazide (HYDRODIURIL) 25 MG tablet Take 1 tablet (25 mg total) by mouth daily. 90 tablet 1  . ibuprofen (ADVIL) 200 MG tablet Take 2 tablets by mouth three times a day (Patient taking differently: Take 400 mg by mouth 3 (three) times daily. ) 30  tablet 0  . levothyroxine (SYNTHROID, LEVOTHROID) 25 MCG tablet TAKE 1 TABLET BY MOUTH ONCE DAILY 90 tablet 1  . lisinopril (PRINIVIL,ZESTRIL) 5 MG tablet take 1/2 tablet once daily 90 tablet 1  . loratadine (CLARITIN) 10 MG tablet Take 10 mg by mouth daily as needed for allergies.     . Magnesium 250 MG TABS Take 250 mg by mouth daily.    . Multiple Vitamins-Minerals (MULTIVITAMIN WITH IRON-MINERALS) liquid Take 5 mLs by mouth daily.    Marland Kitchen oxyCODONE (OXY IR/ROXICODONE) 5 MG immediate release tablet Take 1 tablet (5 mg total) by mouth every 4 (four) hours as needed for severe pain. 180 tablet 0  . polyethylene glycol powder (GLYCOLAX/MIRALAX) powder Take 17 g by mouth daily.     . pravastatin (PRAVACHOL) 20 MG tablet TAKE 1 TABLET BY MOUTH DAILY ON MONDAY, WEDNESDAY, FRIDAY 12 tablet 1  . tiZANidine (ZANAFLEX) 4 MG tablet TAKE 1 TABLET BY MOUTH EVERY 8 HOURS (Patient taking differently: Take 2-4 mg by mouth once daily at night as needed for muscle spasms) 30 tablet 2  . triamcinolone cream (KENALOG) 0.1 % Apply 1 application topically 2 (two) times daily. (Patient taking differently: Apply 1 application topically daily as needed (rash). ) 15 g 4    No results found for this or any previous visit (from the past 48 hour(s)). No results found.  Review of Systems  Constitutional: Negative for chills and fever.  HENT: Negative for hearing loss.   Eyes: Negative for blurred vision and double vision.  Respiratory: Negative for cough and hemoptysis.   Cardiovascular: Negative for chest pain and palpitations.  Gastrointestinal: Negative for abdominal pain, nausea and vomiting.  Genitourinary: Negative for dysuria and urgency.  Musculoskeletal: Negative for myalgias and neck pain.  Skin: Negative for itching and rash.  Neurological: Negative for dizziness, tingling and headaches.  Endo/Heme/Allergies: Does not bruise/bleed easily.  Psychiatric/Behavioral: Negative for depression and suicidal ideas.     Blood pressure 124/79, pulse 92, temperature 98 F (36.7 C), temperature source Oral, resp. rate 16, SpO2 100 %. Physical Exam  Vitals reviewed. Constitutional: She is oriented to person, place, and time. She appears well-developed and well-nourished.  HENT:  Head: Normocephalic and atraumatic.  Eyes: Pupils are equal, round, and reactive to light. Conjunctivae and EOM are normal.  Neck: Normal range of motion. Neck supple.  Cardiovascular: Normal rate and regular rhythm.  Respiratory: Effort normal and breath sounds normal.  GI: Soft. Bowel sounds are normal. She exhibits no distension. There is no tenderness.  Incisional hernia  Musculoskeletal: Normal range of motion.  Neurological: She is alert and oriented to person, place, and time.  Skin: Skin is warm and dry.  Psychiatric: She has a normal  mood and affect. Her behavior is normal.     Assessment/Plan 67 yo female with incisional hernia -lap incisional hernia with mesh -observation stay  Mickeal Skinner, MD 06/14/2018, 7:19 AM

## 2018-06-14 NOTE — Anesthesia Postprocedure Evaluation (Signed)
Anesthesia Post Note  Patient: Amanda Osborne  Procedure(s) Performed: LAPAROSCOPIC INCISIONAL HERNIA REPAIR WITH INSERTION OF MESH ERAS PATHWAY (N/A )     Patient location during evaluation: PACU Anesthesia Type: General Level of consciousness: sedated, oriented and patient cooperative Pain management: pain level controlled Vital Signs Assessment: post-procedure vital signs reviewed and stable Respiratory status: spontaneous breathing, nonlabored ventilation and respiratory function stable Cardiovascular status: blood pressure returned to baseline and stable Postop Assessment: no apparent nausea or vomiting Anesthetic complications: no    Last Vitals:  Vitals:   06/14/18 0945 06/14/18 1000  BP: 135/72 135/71  Pulse: 74 69  Resp: 10 15  Temp:    SpO2: 100% 100%    Last Pain:  Vitals:   06/14/18 1000  TempSrc:   PainSc: 5                  [name],E. Madden Piazza

## 2018-06-15 ENCOUNTER — Encounter (HOSPITAL_COMMUNITY): Payer: Self-pay | Admitting: General Surgery

## 2018-06-15 DIAGNOSIS — R51 Headache: Secondary | ICD-10-CM | POA: Diagnosis not present

## 2018-06-15 DIAGNOSIS — K219 Gastro-esophageal reflux disease without esophagitis: Secondary | ICD-10-CM | POA: Diagnosis not present

## 2018-06-15 DIAGNOSIS — Z9884 Bariatric surgery status: Secondary | ICD-10-CM | POA: Diagnosis not present

## 2018-06-15 DIAGNOSIS — I10 Essential (primary) hypertension: Secondary | ICD-10-CM | POA: Diagnosis not present

## 2018-06-15 DIAGNOSIS — Z800 Family history of malignant neoplasm of digestive organs: Secondary | ICD-10-CM | POA: Diagnosis not present

## 2018-06-15 DIAGNOSIS — G47419 Narcolepsy without cataplexy: Secondary | ICD-10-CM | POA: Diagnosis not present

## 2018-06-15 DIAGNOSIS — Z981 Arthrodesis status: Secondary | ICD-10-CM | POA: Diagnosis not present

## 2018-06-15 DIAGNOSIS — Z888 Allergy status to other drugs, medicaments and biological substances status: Secondary | ICD-10-CM | POA: Diagnosis not present

## 2018-06-15 DIAGNOSIS — E785 Hyperlipidemia, unspecified: Secondary | ICD-10-CM | POA: Diagnosis not present

## 2018-06-15 DIAGNOSIS — M5481 Occipital neuralgia: Secondary | ICD-10-CM | POA: Diagnosis not present

## 2018-06-15 DIAGNOSIS — K432 Incisional hernia without obstruction or gangrene: Secondary | ICD-10-CM | POA: Diagnosis not present

## 2018-06-15 DIAGNOSIS — E559 Vitamin D deficiency, unspecified: Secondary | ICD-10-CM | POA: Diagnosis not present

## 2018-06-15 DIAGNOSIS — D509 Iron deficiency anemia, unspecified: Secondary | ICD-10-CM | POA: Diagnosis not present

## 2018-06-15 DIAGNOSIS — M1990 Unspecified osteoarthritis, unspecified site: Secondary | ICD-10-CM | POA: Diagnosis not present

## 2018-06-15 DIAGNOSIS — Z833 Family history of diabetes mellitus: Secondary | ICD-10-CM | POA: Diagnosis not present

## 2018-06-15 DIAGNOSIS — E039 Hypothyroidism, unspecified: Secondary | ICD-10-CM | POA: Diagnosis not present

## 2018-06-15 DIAGNOSIS — M797 Fibromyalgia: Secondary | ICD-10-CM | POA: Diagnosis not present

## 2018-06-15 DIAGNOSIS — Z79899 Other long term (current) drug therapy: Secondary | ICD-10-CM | POA: Diagnosis not present

## 2018-06-15 DIAGNOSIS — Z8249 Family history of ischemic heart disease and other diseases of the circulatory system: Secondary | ICD-10-CM | POA: Diagnosis not present

## 2018-06-15 DIAGNOSIS — Z8489 Family history of other specified conditions: Secondary | ICD-10-CM | POA: Diagnosis not present

## 2018-06-15 LAB — CBC
HCT: 33.2 % — ABNORMAL LOW (ref 36.0–46.0)
HEMOGLOBIN: 11 g/dL — AB (ref 12.0–15.0)
MCH: 29.4 pg (ref 26.0–34.0)
MCHC: 33.1 g/dL (ref 30.0–36.0)
MCV: 88.8 fL (ref 78.0–100.0)
PLATELETS: 264 10*3/uL (ref 150–400)
RBC: 3.74 MIL/uL — AB (ref 3.87–5.11)
RDW: 13.6 % (ref 11.5–15.5)
WBC: 6.4 10*3/uL (ref 4.0–10.5)

## 2018-06-15 LAB — BASIC METABOLIC PANEL
ANION GAP: 6 (ref 5–15)
BUN: 23 mg/dL (ref 8–23)
CALCIUM: 8.6 mg/dL — AB (ref 8.9–10.3)
CO2: 27 mmol/L (ref 22–32)
CREATININE: 0.44 mg/dL (ref 0.44–1.00)
Chloride: 104 mmol/L (ref 98–111)
GFR calc Af Amer: >60 mL/min (ref >60)
GFR calc non Af Amer: >60 mL/min (ref >60)
Glucose, Bld: 119 mg/dL — ABNORMAL HIGH (ref 70–99)
Potassium: 3.6 mmol/L (ref 3.5–5.1)
Sodium: 137 mmol/L (ref 135–145)

## 2018-06-15 MED ORDER — IBUPROFEN 800 MG PO TABS: 800.0000 mg | ORAL_TABLET | Freq: Three times a day (TID) | ORAL | 0 refills | Status: DC | PRN

## 2018-06-15 MED ORDER — HYDROCODONE-ACETAMINOPHEN 5-325 MG PO TABS: 1.0000 | ORAL_TABLET | Freq: Four times a day (QID) | ORAL | 0 refills | Status: DC | PRN

## 2018-06-15 NOTE — Discharge Planning (Signed)
Discharge instructions reviewed with patient and copy left for patient's records. IV removed. Pt calling friend to come and get her.

## 2018-06-15 NOTE — Discharge Summary (Signed)
Physician Discharge Summary  Amanda Osborne:[id number] DOB: 25-Oct-1951 DOA: 06/14/2018  PCP: Kathyrn Drown, MD  Admit date: 06/14/2018 Discharge date: 06/15/2018  Recommendations for Outpatient Follow-up:  1.  (include homehealth, outpatient follow-up instructions, specific recommendations for PCP to follow-up on, etc.)   Discharge Diagnoses:  Active Problems:   Incisional hernia   Surgical Procedure: lap incisional hernia repair with mesh  Discharge Condition: Good Disposition: Home  Diet recommendation: heart healthy diet   Hospital Course:  67 yo female presented for symptomatic hernia. She underwent lap incisional hernia repair with mesh. She did well tolerating a diet after surgery and ambulating well. She was discharged home POD 1.  Discharge Instructions  Discharge Instructions    Call MD for:  difficulty breathing, headache or visual disturbances   Complete by:  As directed    Call MD for:  persistant nausea and vomiting   Complete by:  As directed    Call MD for:  redness, tenderness, or signs of infection (pain, swelling, redness, odor or green/yellow discharge around incision site)   Complete by:  As directed    Call MD for:  severe uncontrolled pain   Complete by:  As directed    Call MD for:  temperature >100.4   Complete by:  As directed    Diet - low sodium heart healthy   Complete by:  As directed    Discharge wound care:   Complete by:  As directed    Remove bandaids tomorrow. Ok to shower tomorrow  Steristrips will likely peel off in 1-3 weeks   Increase activity slowly   Complete by:  As directed    Lifting restrictions   Complete by:  As directed    Do not lift more than 20 pounds for 3-4 weeks     Allergies as of 06/15/2018      Reactions   Celexa [citalopram Hydrobromide] Nausea And Vomiting   Fosamax [alendronate Sodium]    Esophagitis    Neurontin [gabapentin]    Drowsy    Soma [carisoprodol]    dizzy      Medication List     STOP taking these medications   oxyCODONE 5 MG immediate release tablet Commonly known as:  Oxy IR/ROXICODONE     TAKE these medications   ALPRAZolam 0.5 MG tablet Commonly known as:  XANAX TAKE ONE TABLET TWICE DAILY AS NEEDED What changed:  See the new instructions.   B-12 3000 MCG Caps Take 3,000 mcg by mouth 3 (three) times daily.   BIOTIN 5000 5 MG Caps Generic drug:  Biotin Take 5 mg by mouth 3 (three) times daily.   esomeprazole 20 MG capsule Commonly known as:  NEXIUM Take 20 mg by mouth daily.   famotidine 20 MG tablet Commonly known as:  PEPCID take 1 tablet by mouth twice a day What changed:    how much to take  how to take this  when to take this   FERRO-SEQUELS 65-25 MG Tbcr Generic drug:  Ferrous Fumarate-Vitamin C ER Take 1 tablet by mouth daily.   fluticasone 50 MCG/ACT nasal spray Commonly known as:  FLONASE INSTILL 2 SPRAYS INTO EACH NOSTRIL ONCE DAILY What changed:  See the new instructions.   hydrochlorothiazide 25 MG tablet Commonly known as:  HYDRODIURIL Take 1 tablet (25 mg total) by mouth daily.   HYDROcodone-acetaminophen 5-325 MG tablet Commonly known as:  NORCO/VICODIN Take 1 tablet by mouth every 6 (six) hours as needed for moderate pain.  ibuprofen 800 MG tablet Commonly known as:  ADVIL,MOTRIN Take 1 tablet (800 mg total) by mouth every 8 (eight) hours as needed. What changed:    medication strength  how much to take  how to take this  when to take this  reasons to take this  additional instructions   levothyroxine 25 MCG tablet Commonly known as:  SYNTHROID, LEVOTHROID TAKE 1 TABLET BY MOUTH ONCE DAILY   lisinopril 5 MG tablet Commonly known as:  PRINIVIL,ZESTRIL take 1/2 tablet once daily   loratadine 10 MG tablet Commonly known as:  CLARITIN Take 10 mg by mouth daily as needed for allergies.   Magnesium 250 MG Tabs Take 250 mg by mouth daily.   multivitamin with iron-minerals liquid Take 5 mLs by  mouth daily.   polyethylene glycol powder powder Commonly known as:  GLYCOLAX/MIRALAX Take 17 g by mouth daily.   pravastatin 20 MG tablet Commonly known as:  PRAVACHOL TAKE 1 TABLET BY MOUTH DAILY ON MONDAY, WEDNESDAY, FRIDAY   tiZANidine 4 MG tablet Commonly known as:  ZANAFLEX TAKE 1 TABLET BY MOUTH EVERY 8 HOURS What changed:    how much to take  how to take this  when to take this   triamcinolone cream 0.1 % Commonly known as:  KENALOG Apply 1 application topically 2 (two) times daily. What changed:    when to take this  reasons to take this   Vitamin D 2000 units tablet Take 2,000 Units by mouth 3 (three) times daily.            Discharge Care Instructions  (From admission, onward)        Start     Ordered   06/15/18 0000  Discharge wound care:    Comments:  Remove bandaids tomorrow. Ok to shower tomorrow  Steristrips will likely peel off in 1-3 weeks   06/15/18 0836        The results of significant diagnostics from this hospitalization (including imaging, microbiology, ancillary and laboratory) are listed below for reference.    Significant Diagnostic Studies: No results found.  Labs: Basic Metabolic Panel: Recent Labs  Lab 06/11/18 1335 06/14/18 1228 06/15/18 0439  NA 140  --  137  K 4.5  --  3.6  CL 104  --  104  CO2 28  --  27  GLUCOSE 85  --  119*  BUN 24*  --  23  CREATININE 0.57 0.60 0.44  CALCIUM 9.3  --  8.6*   Liver Function Tests: No results for input(s): AST, ALT, ALKPHOS, BILITOT, PROT, ALBUMIN in the last 168 hours.  CBC: Recent Labs  Lab 06/11/18 1335 06/14/18 1228 06/15/18 0439  WBC 5.1 8.8 6.4  NEUTROABS 3.1  --   --   HGB 14.7 12.6 11.0*  HCT 44.5 38.4 33.2*  MCV 88.8 88.9 88.8  PLT 343 242 264    CBG: No results for input(s): GLUCAP in the last 168 hours.  Active Problems:   Incisional hernia   Time coordinating discharge: 78min

## 2018-06-15 NOTE — Care Management Obs Status (Signed)
[facility] NOTIFICATION   Patient Details  Name: Amanda Osborne MRN: [id number] Date of Birth: 1951-09-11   Medicare Observation Status Notification Given:  Yes    Leeroy Cha, RN 06/15/2018, 9:24 AM

## 2018-06-15 NOTE — Plan of Care (Signed)
Pt a&ox4; up in chair with no complaints. States pain 5/10 but does not want pain medication at this time. Will continue to monitor.

## 2018-06-25 ENCOUNTER — Encounter: Payer: Self-pay | Admitting: Family Medicine

## 2018-06-25 ENCOUNTER — Ambulatory Visit: Payer: Medicare Other | Admitting: Family Medicine

## 2018-06-25 VITALS — Ht 67.0 in

## 2018-06-25 DIAGNOSIS — D509 Iron deficiency anemia, unspecified: Secondary | ICD-10-CM

## 2018-06-25 DIAGNOSIS — Z79891 Long term (current) use of opiate analgesic: Secondary | ICD-10-CM | POA: Diagnosis not present

## 2018-06-25 DIAGNOSIS — E7849 Other hyperlipidemia: Secondary | ICD-10-CM

## 2018-06-25 DIAGNOSIS — E038 Other specified hypothyroidism: Secondary | ICD-10-CM

## 2018-06-25 MED ORDER — OXYCODONE HCL 5 MG PO TABS: 5.0000 mg | ORAL_TABLET | ORAL | 0 refills | Status: DC | PRN

## 2018-06-25 NOTE — Progress Notes (Signed)
Subjective:    Patient ID: Amanda Osborne, female    DOB: Mar 24, 1951, 67 y.o.   MRN: [id number]  HPI This patient was seen today for chronic pain Patient takes her pain medicine that does help her she denies complications with it Drug registry was checked  The medication list was reviewed and updated.   -Compliance with medication: yes  - Number patient states they take daily: 6 a day  -when was the last dose patient took? today  The patient was advised the importance of maintaining medication and not using illegal substances with these.  Here for refills and follow up  The patient was educated that we can provide 3 monthly scripts for their medication, it is their responsibility to follow the instructions.  Side effects or complications from medications: none  Patient is aware that pain medications are meant to minimize the severity of the pain to allow their pain levels to improve to allow for better function. They are aware of that pain medications cannot totally remove their pain.  Due for UDT ( at least once per year) : 12/2017   Patient has thyroid condition.  Takes thyroid medication on a regular basis.  States that the proper way.  Relates compliance.  States no negative side effects.  States condition seems to be under good control.  Patient here for follow-up regarding cholesterol.  The patient does have hyperlipidemia.  Patient does try to maintain a reasonable diet.  Patient does take the medication on a regular basis.  Denies missing a dose.  The patient denies any obvious side effects.  Prior blood work results reviewed with the patient.  The patient is aware of his cholesterol goals and the need to keep it under good control to lessen the risk of disease.  Patient relates a lot of fatigue tiredness denies being depressed.     Review of Systems  Constitutional: Negative for activity change and appetite change.  HENT: Negative for congestion and rhinorrhea.     Respiratory: Negative for cough and shortness of breath.   Cardiovascular: Negative for chest pain and leg swelling.  Gastrointestinal: Negative for abdominal pain, nausea and vomiting.  Skin: Negative for color change.  Neurological: Negative for dizziness and weakness.  Psychiatric/Behavioral: Negative for agitation and confusion.       Objective:   Physical Exam  Constitutional: She appears well-nourished. No distress.  HENT:  Head: Normocephalic and atraumatic.  Eyes: Right eye exhibits no discharge. Left eye exhibits no discharge.  Neck: No tracheal deviation present.  Cardiovascular: Normal rate, regular rhythm and normal heart sounds.  No murmur heard. Pulmonary/Chest: Effort normal and breath sounds normal. No respiratory distress.  Musculoskeletal: She exhibits no edema.  Lymphadenopathy:    She has no cervical adenopathy.  Neurological: She is alert. Coordination normal.  Skin: Skin is warm and dry.  Psychiatric: She has a normal mood and affect. Her behavior is normal.  Vitals reviewed.    25 minutes was spent with patient today discussing healthcare issues which they came.  More than 50% of this visit-total duration of visit-was spent in counseling and coordination of care.  Please see diagnosis regarding the focus of this coordination and care significant time spent with patient discussing her fatigue tiredness hypothyroidism hyperlipidemia as well as chronic pain and checking the drug registry and preventing her prescriptions all in the presence of the patient      Assessment & Plan:  The patient was seen in followup for chronic pain.  A review over at their current pain status was discussed. Drug registry was checked. Prescriptions were given. Discussion was held regarding the importance of compliance with medication as well as pain medication contract.  Time for questions regarding pain management plan occurred. Importance of regular followup visits was  discussed. Patient was informed that medication may cause drowsiness and should not be combined  with other medications/alcohol or street drugs.    Patient with mild fatigue tiredness has history of hypothyroidism and iron deficient anemia we will check lab work await results  Hyperlipidemia  Patient takes her medication as directed continue current measures   Patient was cautioned that medication could cause drowsiness. If the patient feels medication is causing altered alertness then do not drive or operate dangerous equipment.

## 2018-07-21 ENCOUNTER — Other Ambulatory Visit: Payer: Self-pay | Admitting: Family Medicine

## 2018-07-23 ENCOUNTER — Other Ambulatory Visit: Payer: Self-pay | Admitting: Family Medicine

## 2018-08-09 ENCOUNTER — Telehealth: Payer: Self-pay | Admitting: *Deleted

## 2018-08-09 ENCOUNTER — Other Ambulatory Visit: Payer: Self-pay | Admitting: *Deleted

## 2018-08-09 NOTE — Telephone Encounter (Signed)
Pt requested a refill on tobramcyin/sexathasone susup 5 ml place 1 drop into right eye 4 times daily up to 5 days. Pt states she gets this every year. Has a film over eye, yellow drainage, no redness, no eye pain. She states its her left eye this time. She gets this every year and she is going out of town this weekend. States she usually only has to use the drops for about two days and it clears up. Not on med list but under med history. Last sent in 09/01/17  [facility] [address].

## 2018-08-10 MED ORDER — TOBRAMYCIN-DEXAMETHASONE 0.3-0.1 % OP SUSP: 1.0000 [drp] | Freq: Four times a day (QID) | OPHTHALMIC | 0 refills | Status: DC

## 2018-08-10 NOTE — Telephone Encounter (Signed)
It would be fine to do a refill of this medicine

## 2018-08-10 NOTE — Telephone Encounter (Signed)
Prescription sent electronically to pharmacy. Patient notified. 

## 2018-08-22 ENCOUNTER — Other Ambulatory Visit: Payer: Self-pay | Admitting: Family Medicine

## 2018-08-22 DIAGNOSIS — M8580 Other specified disorders of bone density and structure, unspecified site: Secondary | ICD-10-CM

## 2018-08-22 DIAGNOSIS — Z1231 Encounter for screening mammogram for malignant neoplasm of breast: Secondary | ICD-10-CM

## 2018-08-30 ENCOUNTER — Other Ambulatory Visit: Payer: Self-pay | Admitting: Family Medicine

## 2018-08-31 NOTE — Telephone Encounter (Signed)
May have this +5 refills 

## 2018-09-19 ENCOUNTER — Ambulatory Visit (HOSPITAL_COMMUNITY)
Admission: RE | Admit: 2018-09-19 | Discharge: 2018-09-19 | Disposition: A | Discharge status: Home / Self Care | Payer: Medicare Other | Source: Ambulatory Visit | Attending: Family Medicine | Admitting: Family Medicine

## 2018-09-19 DIAGNOSIS — M810 Age-related osteoporosis without current pathological fracture: Secondary | ICD-10-CM | POA: Insufficient documentation

## 2018-09-19 DIAGNOSIS — Z780 Asymptomatic menopausal state: Secondary | ICD-10-CM | POA: Insufficient documentation

## 2018-09-19 DIAGNOSIS — Z1231 Encounter for screening mammogram for malignant neoplasm of breast: Secondary | ICD-10-CM | POA: Insufficient documentation

## 2018-09-24 DIAGNOSIS — E7849 Other hyperlipidemia: Secondary | ICD-10-CM | POA: Diagnosis not present

## 2018-09-24 DIAGNOSIS — D509 Iron deficiency anemia, unspecified: Secondary | ICD-10-CM | POA: Diagnosis not present

## 2018-09-25 ENCOUNTER — Ambulatory Visit: Payer: Medicare Other | Admitting: Family Medicine

## 2018-09-25 ENCOUNTER — Encounter: Payer: Self-pay | Admitting: Family Medicine

## 2018-09-25 VITALS — BP 136/82 | Ht 67.0 in | Wt 180.4 lb

## 2018-09-25 DIAGNOSIS — R202 Paresthesia of skin: Secondary | ICD-10-CM

## 2018-09-25 DIAGNOSIS — R200 Anesthesia of skin: Secondary | ICD-10-CM

## 2018-09-25 DIAGNOSIS — M816 Localized osteoporosis [Lequesne]: Secondary | ICD-10-CM

## 2018-09-25 DIAGNOSIS — M4712 Other spondylosis with myelopathy, cervical region: Secondary | ICD-10-CM | POA: Diagnosis not present

## 2018-09-25 DIAGNOSIS — G5600 Carpal tunnel syndrome, unspecified upper limb: Secondary | ICD-10-CM

## 2018-09-25 DIAGNOSIS — E7849 Other hyperlipidemia: Secondary | ICD-10-CM | POA: Diagnosis not present

## 2018-09-25 DIAGNOSIS — G47419 Narcolepsy without cataplexy: Secondary | ICD-10-CM

## 2018-09-25 DIAGNOSIS — E038 Other specified hypothyroidism: Secondary | ICD-10-CM

## 2018-09-25 DIAGNOSIS — I10 Essential (primary) hypertension: Secondary | ICD-10-CM | POA: Diagnosis not present

## 2018-09-25 LAB — BASIC METABOLIC PANEL
BUN/Creatinine Ratio: 38 — ABNORMAL HIGH (ref 12–28)
BUN: 23 mg/dL (ref 8–27)
CALCIUM: 9.6 mg/dL (ref 8.7–10.3)
CHLORIDE: 101 mmol/L (ref 96–106)
CO2: 26 mmol/L (ref 20–29)
Creatinine, Ser: 0.6 mg/dL (ref 0.57–1.00)
GFR calc Af Amer: 109 mL/min/{1.73_m2} (ref >59)
GFR, EST NON AFRICAN AMERICAN: 95 mL/min/{1.73_m2} (ref >59)
Glucose: 96 mg/dL (ref 65–99)
POTASSIUM: 5.1 mmol/L (ref 3.5–5.2)
SODIUM: 141 mmol/L (ref 134–144)

## 2018-09-25 LAB — FERRITIN: FERRITIN: 27 ng/mL (ref 15–150)

## 2018-09-25 LAB — IRON AND TIBC
IRON SATURATION: 19 % (ref 15–55)
Iron: 67 ug/dL (ref 27–139)
Total Iron Binding Capacity: 361 ug/dL (ref 250–450)
UIBC: 294 ug/dL (ref 118–369)

## 2018-09-25 LAB — CBC WITH DIFFERENTIAL/PLATELET
BASOS ABS: 0 10*3/uL (ref 0.0–0.2)
Basos: 1 %
EOS (ABSOLUTE): 0.6 10*3/uL — AB (ref 0.0–0.4)
Eos: 15 %
HEMOGLOBIN: 13.6 g/dL (ref 11.1–15.9)
Hematocrit: 40.6 % (ref 34.0–46.6)
Immature Grans (Abs): 0 10*3/uL (ref 0.0–0.1)
Immature Granulocytes: 0 %
LYMPHS ABS: 1 10*3/uL (ref 0.7–3.1)
Lymphs: 27 %
MCH: 28.7 pg (ref 26.6–33.0)
MCHC: 33.5 g/dL (ref 31.5–35.7)
MCV: 86 fL (ref 79–97)
MONOCYTES: 8 %
Monocytes Absolute: 0.3 10*3/uL (ref 0.1–0.9)
NEUTROS ABS: 1.9 10*3/uL (ref 1.4–7.0)
Neutrophils: 49 %
Platelets: 274 10*3/uL (ref 150–450)
RBC: 4.74 x10E6/uL (ref 3.77–5.28)
RDW: 12.9 % (ref 12.3–15.4)
WBC: 3.7 10*3/uL (ref 3.4–10.8)

## 2018-09-25 LAB — LIPID PANEL
CHOL/HDL RATIO: 4.2 ratio (ref 0.0–4.4)
Cholesterol, Total: 182 mg/dL (ref 100–199)
HDL: 43 mg/dL (ref >39)
LDL CALC: 121 mg/dL — AB (ref 0–99)
TRIGLYCERIDES: 92 mg/dL (ref 0–149)
VLDL CHOLESTEROL CAL: 18 mg/dL (ref 5–40)

## 2018-09-25 MED ORDER — OXYCODONE HCL 5 MG PO TABS: 5.0000 mg | ORAL_TABLET | ORAL | 0 refills | Status: DC | PRN

## 2018-09-25 NOTE — Progress Notes (Signed)
Subjective:    Patient ID: Amanda Osborne, female    DOB: 11-Feb-1951, 67 y.o.   MRN: [id number]  HPI This patient was seen today for chronic pain  The medication list was reviewed and updated.   -Compliance with medication: yes  - Number patient states they take daily: 6  -when was the last dose patient took? Noon today  The patient was advised the importance of maintaining medication and not using illegal substances with these.  Here for refills and follow up  The patient was educated that we can provide 3 monthly scripts for their medication, it is their responsibility to follow the instructions.  Side effects or complications from medications: none  Patient is aware that pain medications are meant to minimize the severity of the pain to allow their pain levels to improve to allow for better function. They are aware of that pain medications cannot totally remove their pain.  Due for UDT ( at least once per year) : done on 12/27/17  Pt had mammogram, bone density and other test done yesterday.    Time was spent today reviewing her bone density, mammogram, lab work  Patient for blood pressure check up.  The patient does have hypertension.  The patient is on medication.  Patient relates compliance with meds. Todays BP reviewed with the patient. Patient denies issues with medication. Patient relates reasonable diet. Patient tries to minimize salt. Patient aware of BP goals.  Patient has thyroid condition.  Takes thyroid medication on a regular basis.  States that the proper way.  Relates compliance.  States no negative side effects.  States condition seems to be under good control.  Patient here for follow-up regarding cholesterol.  The patient does have hyperlipidemia.  Patient does try to maintain a reasonable diet.  Patient does take the medication on a regular basis.  Denies missing a dose.  The patient denies any obvious side effects.  Prior blood work results reviewed with  the patient.  The patient is aware of his cholesterol goals and the need to keep it under good control to lessen the risk of disease.  Patient also states narcolepsy is flaring up we talked about how she could see a specialist for this she defers  We also discussed the numbness she is having in her left hand we will do nerve conduction studies   Review of Systems  Constitutional: Negative for activity change and appetite change.  HENT: Negative for congestion and rhinorrhea.   Respiratory: Negative for cough and shortness of breath.   Cardiovascular: Negative for chest pain and leg swelling.  Gastrointestinal: Negative for abdominal pain, nausea and vomiting.  Skin: Negative for color change.  Neurological: Negative for dizziness and weakness.  Psychiatric/Behavioral: Negative for agitation and confusion.       Objective:   Physical Exam  Constitutional: She appears well-nourished. No distress.  HENT:  Head: Normocephalic.  Cardiovascular: Normal rate, regular rhythm and normal heart sounds.  No murmur heard. Pulmonary/Chest: Effort normal and breath sounds normal.  Musculoskeletal: She exhibits no edema.  Lymphadenopathy:    She has no cervical adenopathy.  Neurological: She is alert.  Psychiatric: Her behavior is normal.  Vitals reviewed.         Assessment & Plan:  The patient was seen in followup for chronic pain. A review over at their current pain status was discussed. Drug registry was checked. Prescriptions were given. Discussion was held regarding the importance of compliance with medication as well as  pain medication contract.  Time for questions regarding pain management plan occurred. Importance of regular followup visits was discussed. Patient was informed that medication may cause drowsiness and should not be combined  with other medications/alcohol or street drugs. Patient was cautioned that medication could cause drowsiness. If the patient feels  medication is causing altered alertness then do not drive or operate dangerous equipment.  Electronic registry was checked new prescriptions were sent in  HTN- Patient was seen today as part of a visit regarding hypertension. The importance of healthy diet and regular physical activity was discussed. The importance of compliance with medications discussed.  Ideal goal is to keep blood pressure low elevated levels certainly below 336/12 when possible.  The patient was counseled that keeping blood pressure under control lessen his risk of complications.  The importance of regular follow-ups was discussed with the patient.  Low-salt diet such as DASH recommended.  Regular physical activity was recommended as well.  Patient was advised to keep regular follow-ups.  Patient was seen today regarding hypothyroidism.  Importance of healthy diet, regular physical activity was discussed.  Importance of compliance with medication and regular checks regarding this was discussed.   Patient with osteoporosis cannot afford to go to the hospital for infusions cannot tolerate oral Fosamax referral to endocrinology for possible Prolia  Numbness left hand probable carpal tunnel nerve conduction ordered  Hyperlipidemia tolerates low-dose statin 3 days a week  25 minutes was spent with the patient.  This statement verifies that 25 minutes was indeed spent with the patient.  More than 50% of this visit-total duration of the visit-was spent in counseling and coordination of care. The issues that the patient came in for today as reflected in the diagnosis (s) please refer to documentation for further details.

## 2018-09-28 ENCOUNTER — Encounter: Payer: Self-pay | Admitting: Family Medicine

## 2018-10-21 ENCOUNTER — Other Ambulatory Visit: Payer: Self-pay | Admitting: Family Medicine

## 2018-10-22 ENCOUNTER — Encounter: Payer: Self-pay | Admitting: Family Medicine

## 2018-10-22 ENCOUNTER — Ambulatory Visit: Payer: Medicare Other | Admitting: Family Medicine

## 2018-10-22 VITALS — BP 110/60 | Temp 98.7°F | Ht 67.0 in | Wt 185.0 lb

## 2018-10-22 DIAGNOSIS — J019 Acute sinusitis, unspecified: Secondary | ICD-10-CM

## 2018-10-22 DIAGNOSIS — B9689 Other specified bacterial agents as the cause of diseases classified elsewhere: Secondary | ICD-10-CM | POA: Diagnosis not present

## 2018-10-22 MED ORDER — PREDNISONE 20 MG PO TABS: ORAL_TABLET | ORAL | 0 refills | Status: DC

## 2018-10-22 MED ORDER — AMOXICILLIN 500 MG PO TABS: 500.0000 mg | ORAL_TABLET | Freq: Three times a day (TID) | ORAL | 0 refills | Status: DC

## 2018-10-22 NOTE — Progress Notes (Signed)
   Subjective:    Patient ID: Amanda Osborne, female    DOB: 20-Nov-1951, 67 y.o.   MRN: [id number]  Headache   This is a new problem. Episode onset: 4 days. The pain is located in the frontal region. Pertinent negatives include no coughing, ear pain, fever or rhinorrhea. Treatments tried: ibuprofen.   Patient relates 3 to 4 days of a headache also relates sinus pressure pain discomfort she was around a lot of dust but denies any postnasal drainage wheezing difficulty breathing   Review of Systems  Constitutional: Negative for activity change and fever.  HENT: Positive for congestion. Negative for ear pain and rhinorrhea.   Eyes: Negative for discharge.  Respiratory: Negative for cough, shortness of breath and wheezing.   Cardiovascular: Negative for chest pain.  Neurological: Positive for headaches. Negative for light-headedness.       Objective:   Physical Exam  Constitutional: She appears well-developed.  HENT:  Head: Normocephalic.  Nose: Nose normal.  Mouth/Throat: Oropharynx is clear and moist. No oropharyngeal exudate.  Neck: Neck supple.  Cardiovascular: Normal rate and normal heart sounds.  No murmur heard. Pulmonary/Chest: Effort normal and breath sounds normal. She has no wheezes.  Lymphadenopathy:    She has no cervical adenopathy.  Skin: Skin is warm and dry.  Nursing note and vitals reviewed.         Assessment & Plan:  Sinusitis Antibiotic prescribed warning signs discussed follow-up if ongoing troubles Warning signs were discussed in detail Short course of prednisone to help with sinus inflammation

## 2018-11-07 ENCOUNTER — Encounter: Payer: Self-pay | Admitting: Neurology

## 2018-11-07 ENCOUNTER — Ambulatory Visit: Payer: Medicare Other | Admitting: Neurology

## 2018-11-07 ENCOUNTER — Ambulatory Visit (INDEPENDENT_AMBULATORY_CARE_PROVIDER_SITE_OTHER): Payer: Medicare Other | Admitting: Neurology

## 2018-11-07 DIAGNOSIS — G5602 Carpal tunnel syndrome, left upper limb: Secondary | ICD-10-CM | POA: Diagnosis not present

## 2018-11-07 HISTORY — DX: Carpal tunnel syndrome, left upper limb: G56.02

## 2018-11-07 NOTE — Progress Notes (Signed)
Please refer to EMG and nerve conduction procedure note.  

## 2018-11-07 NOTE — Procedures (Signed)
     HISTORY:  Amanda Osborne is a 67 year old patient with a 3-year history of intermittent numbness of the left hand.  Over the last 3 months, the symptoms have become more persistent.  The patient does have chronic neck pain as well, she has had prior cervical spine surgery.  The patient is being evaluated for a possible neuropathy or a cervical radiculopathy.  NERVE CONDUCTION STUDIES:  Nerve conduction studies were performed on both upper extremities.  The distal motor latencies for the median nerves were prolonged on the left, normal on the right and normal for the ulnar nerves bilaterally.  The motor amplitudes for the median and ulnar nerves were normal bilaterally.  The nerve conduction velocities for the median and ulnar nerves were within normal limits bilaterally with exception of some slowing above the elbow only for the left ulnar nerve.  The sensory latencies for the median nerves bilaterally were prolonged on the left, normal on the right, and slightly prolonged for the left ulnar nerve, normal on the right ulnar nerve.  The left radial sensory latency was normal.  EMG STUDIES:  EMG study was performed on the left upper extremity:  The first dorsal interosseous muscle reveals 2 to 4 K units with full recruitment. No fibrillations or positive waves were noted. The abductor pollicis brevis muscle reveals 2 to 4 K units with full recruitment. No fibrillations or positive waves were noted. The extensor indicis proprius muscle reveals 1 to 3 K units with full recruitment. No fibrillations or positive waves were noted. The pronator teres muscle reveals 2 to 3 K units with full recruitment. No fibrillations or positive waves were noted. The flexor digitorum profundus muscle (III-IV) reveals 2 to 3 K units with full recruitment.  No fibrillations or positive waves were noted. The biceps muscle reveals 1 to 2 K units with full recruitment. No fibrillations or positive waves were  noted. The triceps muscle reveals 2 to 4 K units with full recruitment. No fibrillations or positive waves were noted. The anterior deltoid muscle reveals 2 to 3 K units with full recruitment. No fibrillations or positive waves were noted. The cervical paraspinal muscles were tested at 2 levels. No abnormalities of insertional activity were seen at either level tested. There was fair relaxation.   IMPRESSION:  Nerve conduction studies done on both upper extremities shows evidence of a mild left carpal tunnel syndrome.  There appears to be evidence of a mild left ulnar neuropathy at the elbow as well.  EMG evaluation of the left upper extremity was otherwise unremarkable, without evidence of an overlying cervical radiculopathy.  Jill Alexanders MD 11/07/2018 3:12 PM  [facility] [address] [place], [place] [address]  Phone [phone] Fax [phone]

## 2018-11-08 ENCOUNTER — Other Ambulatory Visit: Payer: Self-pay | Admitting: Family Medicine

## 2018-11-08 ENCOUNTER — Telehealth: Payer: Self-pay | Admitting: Family Medicine

## 2018-11-08 DIAGNOSIS — G5602 Carpal tunnel syndrome, left upper limb: Secondary | ICD-10-CM

## 2018-11-08 MED ORDER — CEFPROZIL 500 MG PO TABS: ORAL_TABLET | ORAL | 0 refills | Status: DC

## 2018-11-08 NOTE — Telephone Encounter (Signed)
cefzil 500 bid ten d 

## 2018-11-08 NOTE — Telephone Encounter (Signed)
Was seen for sinus infection a couple weeks ago. Pt states she finished ABT. Frontal headache, sinus drainage,  pt states she did develop a fever of 103 on Wednesday; down to 101 at 5 am; next day no fever and felt better; but patient states she does not have fever at this time and no shortness of breath or trouble breathing. Pt did get nasal spray Zicam and is using humidifier. Pt states she can come in if neccessary but where she has already been seen, can provider call in another round of ABT? [facility] on [address] Please advise. Thank you. Results discussed with patient and referral placed to Dr. Aline Brochure.

## 2018-11-08 NOTE — Telephone Encounter (Signed)
Please advise. Thank you

## 2018-11-08 NOTE — Telephone Encounter (Signed)
Medication sent in and patient is aware.  

## 2018-11-08 NOTE — Telephone Encounter (Signed)
Patient recently saw Dr. Jannifer Franklin for nerve conduction study It shows carpal tunnel syndrome Typically we recommend follow-up with orthopedics and they will consider injections versus surgery  Please discuss results with patient Let her know we recommend orthopedic referral See if she has a preference if not we can set her up either locally with Dr. Aline Brochure or down in [place]

## 2018-11-12 ENCOUNTER — Encounter: Payer: Self-pay | Admitting: Family Medicine

## 2018-11-21 ENCOUNTER — Other Ambulatory Visit: Payer: Self-pay | Admitting: Family Medicine

## 2018-12-07 DIAGNOSIS — H6121 Impacted cerumen, right ear: Secondary | ICD-10-CM | POA: Diagnosis not present

## 2018-12-07 DIAGNOSIS — B9689 Other specified bacterial agents as the cause of diseases classified elsewhere: Secondary | ICD-10-CM | POA: Diagnosis not present

## 2018-12-07 DIAGNOSIS — J328 Other chronic sinusitis: Secondary | ICD-10-CM | POA: Diagnosis not present

## 2018-12-07 DIAGNOSIS — J00 Acute nasopharyngitis [common cold]: Secondary | ICD-10-CM | POA: Diagnosis not present

## 2018-12-25 ENCOUNTER — Other Ambulatory Visit: Payer: Self-pay | Admitting: Family Medicine

## 2018-12-25 ENCOUNTER — Ambulatory Visit (INDEPENDENT_AMBULATORY_CARE_PROVIDER_SITE_OTHER): Payer: Medicare Other | Admitting: Family Medicine

## 2018-12-25 ENCOUNTER — Encounter: Payer: Self-pay | Admitting: Family Medicine

## 2018-12-25 VITALS — BP 114/68 | Ht 67.0 in | Wt 186.0 lb

## 2018-12-25 DIAGNOSIS — Z79891 Long term (current) use of opiate analgesic: Secondary | ICD-10-CM | POA: Diagnosis not present

## 2018-12-25 DIAGNOSIS — J3489 Other specified disorders of nose and nasal sinuses: Secondary | ICD-10-CM | POA: Diagnosis not present

## 2018-12-25 DIAGNOSIS — R51 Headache: Secondary | ICD-10-CM

## 2018-12-25 DIAGNOSIS — F411 Generalized anxiety disorder: Secondary | ICD-10-CM

## 2018-12-25 DIAGNOSIS — E038 Other specified hypothyroidism: Secondary | ICD-10-CM | POA: Diagnosis not present

## 2018-12-25 DIAGNOSIS — R519 Headache, unspecified: Secondary | ICD-10-CM

## 2018-12-25 MED ORDER — PRAVASTATIN SODIUM 20 MG PO TABS: ORAL_TABLET | ORAL | 5 refills | Status: DC

## 2018-12-25 MED ORDER — HYDROCHLOROTHIAZIDE 25 MG PO TABS: ORAL_TABLET | ORAL | 1 refills | Status: DC

## 2018-12-25 MED ORDER — OXYCODONE HCL 5 MG PO TABS: 5.0000 mg | ORAL_TABLET | ORAL | 0 refills | Status: DC | PRN

## 2018-12-25 NOTE — Progress Notes (Signed)
Subjective:    Patient ID: Amanda Osborne, female    DOB: Jul 05, 1951, 68 y.o.   MRN: [id number]  HPI  Patient is here today to follow up on her chronic health issues.  Hypertension: lisinopril 5 mg 1/2 per day Patient for blood pressure check up.  The patient does have hypertension.  The patient is on medication.  Patient relates compliance with meds. Todays BP reviewed with the patient. Patient denies issues with medication. Patient relates reasonable diet. Patient tries to minimize salt. Patient aware of BP goals.  Gerd: Nexium 20 mg per day and famotidine prn Patient does have ongoing trouble with reflux.  Takes medication on a regular basis.  Tries to minimize foods as best they can.  They understand the importance of dietary compliance.  May also try to avoid eating a large meal close to bedtime.  Patient denies any dysphagia denies hematochezia.  States medicine does a good job keeping the problem under good control.  Without the medication may certainly have issues.They desire to continue taking their medication.  Hypothyroidism: Levothyroxin 25 mg per day  Patient has thyroid condition.  Takes thyroid medication on a regular basis.  States that the proper way.  Relates compliance.  States no negative side effects.  States condition seems to be under good control.  General anxiety: Xanax 0.5mg  Bid patient under severe amount of stress because she was closing her dad's estate in the brother is accusing her of taking too much money the patient states she is done more than her fair share of try and keep things level in fair but has not had good success in dealing with her brother because of his contentious nature  Depression: Not on anything currently per pt.  is patient is here today regarding follow-up regarding depression.  Patient relates compliance with medication.  Patient understands importance of taking the medication.  Patient denies any significant side effects.  Patient  relates that the medication is still beneficial for them and they would like to continue the medication.  Patient is not having any threats of homicide or suicide.  This patient was seen today for chronic pain  The medication list was reviewed and updated.   -Compliance with medication: Yes  - Number patient states they take daily: six per day  -when was the last dose patient took? Today at noon  The patient was advised the importance of maintaining medication and not using illegal substances with these.  Here for refills and follow up Yes  The patient was educated that we can provide 3 monthly scripts for their medication, it is their responsibility to follow the instructions.  Side effects or complications from medications: None  Patient is aware that pain medications are meant to minimize the severity of the pain to allow their pain levels to improve to allow for better function. They are aware of that pain medications cannot totally remove their pain.  Due for UDT ( at least once per year) : 12/25/2018      Review of Systems  Constitutional: Negative for activity change and appetite change.  HENT: Positive for congestion and sinus pressure. Negative for rhinorrhea.   Respiratory: Negative for cough and shortness of breath.   Cardiovascular: Negative for chest pain and leg swelling.  Gastrointestinal: Negative for abdominal pain, nausea and vomiting.  Skin: Negative for color change.  Neurological: Negative for dizziness and weakness.  Psychiatric/Behavioral: Negative for agitation and confusion.       Objective:  Physical Exam Vitals signs and nursing note reviewed.  Constitutional:      General: She is not in acute distress.    Appearance: She is well-developed.  HENT:     Head: Normocephalic.     Nose: Nose normal.     Mouth/Throat:     Pharynx: No oropharyngeal exudate.  Neck:     Musculoskeletal: Neck supple.  Cardiovascular:     Rate and Rhythm: Normal  rate and regular rhythm.     Heart sounds: Normal heart sounds. No murmur.  Pulmonary:     Effort: Pulmonary effort is normal.     Breath sounds: Normal breath sounds. No wheezing.  Lymphadenopathy:     Cervical: No cervical adenopathy.  Skin:    General: Skin is warm and dry.  Neurological:     Mental Status: She is alert.  Psychiatric:        Behavior: Behavior normal.           Assessment & Plan:  The patient was seen in followup for chronic pain. A review over at their current pain status was discussed. Drug registry was checked. Prescriptions were given. Discussion was held regarding the importance of compliance with medication as well as pain medication contract.  Time for questions regarding pain management plan occurred. Importance of regular followup visits was discussed. Patient was informed that medication may cause drowsiness and should not be combined  with other medications/alcohol or street drugs. Patient was cautioned that medication could cause drowsiness. If the patient feels medication is causing altered alertness then do not drive or operate dangerous equipment.  Drug registry checked 3 prescription sent in  Significant sinus pressure and pain will be seeing ENT tomorrow hopefully they will do some tests including the possibility of CT scan of the sinuses if this is negative then he will need she will need further intervention to look into other potential causes could be a neuralgia issue  Hypothyroidism she will go ahead with taking her medication on a regular basis and no lab work on today's visit  Patient does take low-dose Xanax has done so for years it is done so with the pain medicine is not causing drowsiness continue the current measures.

## 2018-12-26 ENCOUNTER — Encounter: Payer: Self-pay | Admitting: Family Medicine

## 2018-12-26 DIAGNOSIS — Z8709 Personal history of other diseases of the respiratory system: Secondary | ICD-10-CM | POA: Diagnosis not present

## 2018-12-26 DIAGNOSIS — R51 Headache: Secondary | ICD-10-CM | POA: Diagnosis not present

## 2018-12-26 DIAGNOSIS — J320 Chronic maxillary sinusitis: Secondary | ICD-10-CM | POA: Diagnosis not present

## 2018-12-28 LAB — TOXASSURE SELECT 13 (MW), URINE

## 2019-01-08 ENCOUNTER — Ambulatory Visit (INDEPENDENT_AMBULATORY_CARE_PROVIDER_SITE_OTHER): Payer: Medicare Other | Admitting: Family Medicine

## 2019-01-08 ENCOUNTER — Encounter: Payer: Self-pay | Admitting: Family Medicine

## 2019-01-08 VITALS — Temp 97.9°F | Wt 187.0 lb

## 2019-01-08 DIAGNOSIS — R51 Headache: Secondary | ICD-10-CM

## 2019-01-08 DIAGNOSIS — R519 Headache, unspecified: Secondary | ICD-10-CM

## 2019-01-08 MED ORDER — TOPIRAMATE 50 MG PO TABS: 50.0000 mg | ORAL_TABLET | Freq: Two times a day (BID) | ORAL | 3 refills | Status: DC

## 2019-01-08 NOTE — Progress Notes (Signed)
   Subjective:    Patient ID: Amanda Osborne, female    DOB: Mar 06, 1951, 68 y.o.   MRN: [id number]  Headache   This is a recurrent problem. The current episode started more than 1 month ago. The pain is located in the frontal region. The pain radiates to the face. The pain quality is not similar to prior headaches. The quality of the pain is described as dull, aching and throbbing. Pertinent negatives include no abdominal pain or coughing. Associated symptoms comments: Vision noticeably worse but was seen by eye dr and prescribed new glasses. She has tried NSAIDs and acetaminophen (Prescribed pain pills) for the symptoms.   Pt states she has seen an ENT and she said that the headaches are not sinus related.  Severe pain in the middle part of her head above the frontal sinuses radiates into the the brain causes her to have nausea feel unsteady the pain is persistent and consistent and is every single day it does wake her up at night.  No ataxia or falls as a result but states she feels horrible. Pt states she takes 2 IBU and one tylenol with pain med each med. Pt would like to stay on low dose pain pill.   Review of Systems  Constitutional: Negative for activity change, appetite change and fatigue.  HENT: Negative for congestion.   Respiratory: Negative for cough.   Cardiovascular: Negative for chest pain.  Gastrointestinal: Negative for abdominal pain.  Skin: Negative for color change.  Neurological: Positive for headaches.  Psychiatric/Behavioral: Negative for behavioral problems.       Objective:   Physical Exam Vitals signs reviewed.  Constitutional:      General: She is not in acute distress. HENT:     Head: Normocephalic.  Cardiovascular:     Rate and Rhythm: Normal rate and regular rhythm.     Heart sounds: Normal heart sounds. No murmur.  Pulmonary:     Effort: Pulmonary effort is normal.     Breath sounds: Normal breath sounds.  Lymphadenopathy:     Cervical: No  cervical adenopathy.  Neurological:     Mental Status: She is alert.  Psychiatric:        Behavior: Behavior normal.           Assessment & Plan:  Severe frequent daily headaches present for 3 months worse over the past few weeks wakes her up at night nausea with it difficulty with cognitive skills at times because of the severity of the headache taking her chronic pain medicine but also supplementing it with ibuprofen  Patient was counseled to try to cut back on ibuprofen to lessen the amount of rebound headache  Start Topamax 50 mg twice daily May need neurology referral she would prefer with [facility]  MRI of the brain indicated because of progressive severe headaches need to make sure she does not have a tumor or cyst or growth causing her headaches  Patient to give [place] feedback in the next 10 to 14 days  Keep regular chronic pain appointments

## 2019-01-10 ENCOUNTER — Other Ambulatory Visit: Payer: Self-pay | Admitting: Family Medicine

## 2019-01-14 ENCOUNTER — Other Ambulatory Visit: Payer: Self-pay | Admitting: Family Medicine

## 2019-01-14 ENCOUNTER — Encounter: Payer: Self-pay | Admitting: Family Medicine

## 2019-01-14 MED ORDER — PREDNISONE 20 MG PO TABS: ORAL_TABLET | ORAL | 0 refills | Status: DC

## 2019-01-14 NOTE — Telephone Encounter (Signed)
May have prednisone 20 mg, #12 3 pills/day for 2 days Then 2 pills/day for 2 days Then 1 pill a day for 2 days  Please send prescription Please send patient my chart message letting her know this is been sent and recommending to cut back on the ibuprofen

## 2019-01-16 ENCOUNTER — Other Ambulatory Visit: Payer: Self-pay | Admitting: Family Medicine

## 2019-01-17 ENCOUNTER — Ambulatory Visit (HOSPITAL_COMMUNITY)
Admission: RE | Admit: 2019-01-17 | Discharge: 2019-01-17 | Disposition: A | Discharge status: Home / Self Care | Payer: Medicare Other | Source: Ambulatory Visit | Attending: Family Medicine | Admitting: Family Medicine

## 2019-01-17 DIAGNOSIS — R51 Headache: Secondary | ICD-10-CM | POA: Diagnosis not present

## 2019-01-23 ENCOUNTER — Encounter: Payer: Self-pay | Admitting: Family Medicine

## 2019-01-23 NOTE — Telephone Encounter (Signed)
Nurses Please let the patient know that we discussed this I was hopeful that the Topamax would help her headaches We are certainly sympathetic she is having severe headaches We will go ahead with referral to neurology [facility] It can take several weeks to get in Continue the Topamax Please educate patient regarding referral process If she feels her headaches are getting worse between now and when she sees a neurologist she can do a follow-up visit

## 2019-01-24 ENCOUNTER — Other Ambulatory Visit: Payer: Self-pay | Admitting: Family Medicine

## 2019-01-24 DIAGNOSIS — R51 Headache: Principal | ICD-10-CM

## 2019-01-24 DIAGNOSIS — R519 Headache, unspecified: Secondary | ICD-10-CM

## 2019-01-28 ENCOUNTER — Encounter: Payer: Self-pay | Admitting: Family Medicine

## 2019-01-30 ENCOUNTER — Telehealth: Payer: Self-pay | Admitting: Neurology

## 2019-01-30 NOTE — Telephone Encounter (Signed)
Okay with me to have me see the patient.

## 2019-01-30 NOTE — Telephone Encounter (Signed)
Im ok with her to go back to Dr. Jannifer Franklin, since she was last seen in 2016 she was technically a new patient when she saw Willis. thanks

## 2019-01-30 NOTE — Telephone Encounter (Signed)
Dr. Jannifer Franklin you saw this patient 10/2018 for a Nerve conduction, EMG and when the appointment was scheduled Lexine Baton did not realize the patient had seen Dr Jaynee Eagles in 2016 is it ok for the patient to come back and see you as her provider ? Patient is now being referred for severe frontal headaches, or does the patient need to go back to Dr. Jaynee Eagles?

## 2019-02-14 ENCOUNTER — Institutional Professional Consult (permissible substitution): Payer: Medicare Other | Admitting: Neurology

## 2019-02-22 ENCOUNTER — Other Ambulatory Visit: Payer: Self-pay | Admitting: Family Medicine

## 2019-03-07 ENCOUNTER — Other Ambulatory Visit: Payer: Self-pay | Admitting: Family Medicine

## 2019-03-07 NOTE — Telephone Encounter (Signed)
May have this +3 refills 

## 2019-03-21 ENCOUNTER — Ambulatory Visit (INDEPENDENT_AMBULATORY_CARE_PROVIDER_SITE_OTHER): Payer: Medicare Other | Admitting: Family Medicine

## 2019-03-21 ENCOUNTER — Other Ambulatory Visit: Payer: Self-pay

## 2019-03-21 DIAGNOSIS — E038 Other specified hypothyroidism: Secondary | ICD-10-CM

## 2019-03-21 DIAGNOSIS — E7849 Other hyperlipidemia: Secondary | ICD-10-CM

## 2019-03-21 DIAGNOSIS — I10 Essential (primary) hypertension: Secondary | ICD-10-CM | POA: Diagnosis not present

## 2019-03-21 DIAGNOSIS — D509 Iron deficiency anemia, unspecified: Secondary | ICD-10-CM | POA: Diagnosis not present

## 2019-03-21 DIAGNOSIS — Z79891 Long term (current) use of opiate analgesic: Secondary | ICD-10-CM | POA: Diagnosis not present

## 2019-03-21 MED ORDER — OXYCODONE HCL 5 MG PO TABS: 5.0000 mg | ORAL_TABLET | ORAL | 0 refills | Status: DC | PRN

## 2019-03-21 MED ORDER — LISINOPRIL 5 MG PO TABS: 2.5000 mg | ORAL_TABLET | Freq: Every day | ORAL | 1 refills | Status: DC

## 2019-03-21 MED ORDER — FLUTICASONE PROPIONATE 50 MCG/ACT NA SUSP: NASAL | 5 refills | Status: DC

## 2019-03-21 NOTE — Progress Notes (Addendum)
Subjective:  Patient unable to do video  Patient ID: Amanda Osborne, female    DOB: [birth year], 68 y.o.   MRN: [id number] Telephone only Video not available to the patient  HPI This patient was seen today for chronic pain  The medication list was reviewed and updated.   -Compliance with medication: yes  - Number patient states they take daily: 6  -when was the last dose patient took? today  The patient was advised the importance of maintaining medication and not using illegal substances with these.  Here for refills and follow up  The patient was educated that we can provide 3 monthly scripts for their medication, it is their responsibility to follow the instructions.  Side effects or complications from medications: none  Patient is aware that pain medications are meant to minimize the severity of the pain to allow their pain levels to improve to allow for better function. They are aware of that pain medications cannot totally remove their pain.  Patient for blood pressure check up.  The patient does have hypertension.  The patient is on medication.  Patient relates compliance with meds. Todays BP reviewed with the patient. Patient denies issues with medication. Patient relates reasonable diet. Patient tries to minimize salt. Patient aware of BP goals.  Patient has thyroid condition.  Takes thyroid medication on a regular basis.  States that the proper way.  Relates compliance.  States no negative side effects.  States condition seems to be under good control.  Patient here for follow-up regarding cholesterol.  The patient does have hyperlipidemia.  Patient does try to maintain a reasonable diet.  Patient does take the medication on a regular basis.  Denies missing a dose.  The patient denies any obvious side effects.  Prior blood work results reviewed with the patient.  The patient is aware of his cholesterol goals and the need to keep it under good control to lessen the risk of  disease.  We did discuss her medications she does use the Xanax because of chronic anxiety and use this for years has not had interactions with the oxycodone we have tried in the past to taper both of medications unsuccessfully so therefore she has been kept on her current dosing Virtual Visit via Video Note  I connected with Amanda Osborne on 03/21/19 at  9:00 AM EDT by a video enabled telemedicine application and verified that I am speaking with the correct person using two identifiers.  Location: Patient: home Provider: office   I discussed the limitations of evaluation and management by telemedicine and the availability of in person appointments. The patient expressed understanding and agreed to proceed.  History of Present Illness:    Observations/Objective:   Assessment and Plan:   Follow Up Instructions:    I discussed the assessment and treatment plan with the patient. The patient was provided an opportunity to ask questions and all were answered. The patient agreed with the plan and demonstrated an understanding of the instructions.   The patient was advised to call back or seek an in-person evaluation if the symptoms worsen or if the condition fails to improve as anticipated.  I provided 25 minutes of non-face-to-face time during this encounter.     25 minutes was spent with the patient.  This statement verifies that 25 minutes was indeed spent with the patient.  More than 50% of this visit-total duration of the visit-was spent in counseling and coordination of care. The issues that the patient  came in for today as reflected in the diagnosis (s) please refer to documentation for further details.     Review of Systems     Objective:   Physical Exam        Assessment & Plan:  HTN- Patient was seen today as part of a visit regarding hypertension. The importance of healthy diet and regular physical activity was discussed. The importance of compliance with  medications discussed.  Ideal goal is to keep blood pressure low elevated levels certainly below 268/34 when possible.  The patient was counseled that keeping blood pressure under control lessen his risk of complications.  The importance of regular follow-ups was discussed with the patient.  Low-salt diet such as DASH recommended.  Regular physical activity was recommended as well.  Patient was advised to keep regular follow-ups. Patient states she is holding off on HCTZ she will follow her blood pressures  Patient was seen today regarding hypothyroidism.  Importance of healthy diet, regular physical activity was discussed.  Importance of compliance with medication and regular checks regarding this was discussed.  Patient states that she is holding off on her thyroid medicine and will check her lab work in the summer she does not feel she needs a medicine any longer  The patient was seen today as part of an evaluation regarding hyperlipidemia.  Recent lab work has been reviewed with the patient as well as the goals for good cholesterol care.  In addition to this medications have been discussed the importance of compliance with diet and medications discussed as well.  Finally the patient is aware that poor control of cholesterol, noncompliance can dramatically increase the risk of complications. The patient will keep regular office visits and the patient does agreed to periodic lab work.  The patient was seen in followup for chronic pain. A review over at their current pain status was discussed. Drug registry was checked. Prescriptions were given. Discussion was held regarding the importance of compliance with medication as well as pain medication contract.  Time for questions regarding pain management plan occurred. Importance of regular followup visits was discussed. Patient was informed that medication may cause drowsiness and should not be combined  with other medications/alcohol or  street drugs. Patient was cautioned that medication could cause drowsiness. If the patient feels medication is causing altered alertness then do not drive or operate dangerous equipment.  Drug registry checked 3 prescription sent in  She will do lab work before her next follow-up visit in 3 months  She does state that she has held off on her thyroid medicine and HCTZ because she feels that she does not need these medicines anymore and she is willing to do lab work in the summer

## 2019-03-27 ENCOUNTER — Ambulatory Visit: Payer: Medicare Other | Admitting: Family Medicine

## 2019-04-21 ENCOUNTER — Other Ambulatory Visit: Payer: Self-pay | Admitting: Family Medicine

## 2019-04-29 DIAGNOSIS — L821 Other seborrheic keratosis: Secondary | ICD-10-CM | POA: Diagnosis not present

## 2019-04-29 DIAGNOSIS — L814 Other melanin hyperpigmentation: Secondary | ICD-10-CM | POA: Diagnosis not present

## 2019-04-29 DIAGNOSIS — D229 Melanocytic nevi, unspecified: Secondary | ICD-10-CM | POA: Diagnosis not present

## 2019-04-29 DIAGNOSIS — D485 Neoplasm of uncertain behavior of skin: Secondary | ICD-10-CM | POA: Diagnosis not present

## 2019-05-13 ENCOUNTER — Other Ambulatory Visit: Payer: Self-pay

## 2019-05-13 ENCOUNTER — Ambulatory Visit: Payer: Medicare Other | Admitting: Neurology

## 2019-05-13 ENCOUNTER — Encounter: Payer: Self-pay | Admitting: Neurology

## 2019-05-13 DIAGNOSIS — R51 Headache: Secondary | ICD-10-CM

## 2019-05-13 DIAGNOSIS — G4486 Cervicogenic headache: Secondary | ICD-10-CM

## 2019-05-13 HISTORY — DX: Cervicogenic headache: G44.86

## 2019-05-13 MED ORDER — NORTRIPTYLINE HCL 10 MG PO CAPS: ORAL_CAPSULE | ORAL | 3 refills | Status: DC

## 2019-05-13 NOTE — Progress Notes (Signed)
Reason for visit: Headache  Referring physician: Dr. Norman Herrlich is a 68 y.o. female  History of present illness:  [name] is a 68 year old right-handed white female with a history of cervical spine surgery in the past, she originally had surgery at the C6-7 level but then later had surgery of the C5-6 level.  The patient has had some chronic neck stiffness but she claims that she does not have a lot of neck pain.  She will have an occasional trigger point in the left shoulder area.  She was seen through this office in 2015 with occipital headaches.  These headaches have continued over time, she generally is sore at the base of the skull.  More recently in November 2019, she has noted some spread of the headache into the front of the head, below the eye level.  Initially, she was felt to have sinus disease, but investigation revealed that the sinuses were fully clear.  The patient has some occasional discomfort on the top of the head as well.  She describes the head pain as a pressure type sensation that generally is better in the morning and worse as the day goes on.  She does have a parallel between the discomfort in the back of the head and the discomfort on the top of the head and in the front of the head.  She reports that she does have a history of migraine headaches but her migraines were quite different from these headaches.  She began having migraine at age 44 years old until she was around 68 years of age.  The headaches would usually start on one side of the head and then spread to the other side associated with some nausea and vomiting, she would have headaches lasting 2 or 3 days.  The patient has had recent MRI evaluation of the brain that revealed clear sinuses, she has minimal white matter changes, no acute changes were seen.  The patient does report some numbness in the hands, she has had documented left carpal tunnel syndrome previously.  She denies any significant  weakness, she does note some mild gait instability, but no falls.  She denies issues controlling the bowels or the bladder.  She does have some occasional blurred vision and some floaters in the vision.  She denies any dizziness currently but in the past she has had several episodes of vertigo.  She is sent to this office for further evaluation.  Past Medical History:  Diagnosis Date   Anxiety    Arthritis    Cervical disc herniation    c5 and c6   Cervicogenic headache 05/13/2019   Chronic pain    Complication of anesthesia    Fibromyalgia    GERD (gastroesophageal reflux disease)    Headache(784.0)    Hx: of migraines until age 72's   Hyperlipidemia    Hypertension    Iron deficiency anemia 11/30/2012   Left carpal tunnel syndrome 11/07/2018   Narcolepsy    Occipital neuralgia    PONV (postoperative nausea and vomiting)    DURING FIRST NECK SURGERY ; BUT REPORT IT MAY HAVE BEEN THE DILAUDID THAT CAUSED THE VOITING I   Unspecified vitamin D deficiency 03/26/2013   Wrist fracture, bilateral 2005 and 2010   right then left    Past Surgical History:  Procedure Laterality Date   ANTERIOR CERVICAL DECOMP/DISCECTOMY FUSION  2000   C6-C7 DR [facility]    ANTERIOR CERVICAL DECOMP/DISCECTOMY FUSION N/A 08/05/2013  Procedure: Cervical five-six Anterior cervical decompression/diskectomy/fusion/ Synthes plate removal;  Surgeon: Kristeen Miss, MD;  Location: [facility];  Service: Neurosurgery;  Laterality: N/A;   BONE GRAFT HIP ILIAC CREST  2006   to right wrist to correct fracture, DR. [facility]    COLONOSCOPY     Hx; of   DILATION AND CURETTAGE OF UTERUS     ESOPHAGEAL DILATION N/A 10/22/2015   Procedure: ESOPHAGEAL DILATION;  Surgeon: Rogene Houston, MD;  Location: [facility];  Service: Endoscopy;  Laterality: N/A;   ESOPHAGOGASTRODUODENOSCOPY N/A 08/28/2015   Procedure: ESOPHAGOGASTRODUODENOSCOPY (EGD);  Surgeon: Rogene Houston, MD;  Location: [facility];  Service: Endoscopy;  Laterality: N/A;  910am   ESOPHAGOGASTRODUODENOSCOPY N/A 10/22/2015   Procedure: ESOPHAGOGASTRODUODENOSCOPY (EGD);  Surgeon: Rogene Houston, MD;  Location: [facility];  Service: Endoscopy;  Laterality: N/A;  12:00   FRACTURE SURGERY     Hx: of right wrist surgery   GASTRIC BYPASS  1981   INCISIONAL HERNIA REPAIR N/A 06/14/2018   Procedure: LAPAROSCOPIC INCISIONAL HERNIA REPAIR WITH INSERTION OF MESH ERAS PATHWAY;  Surgeon: Kinsinger, Arta Bruce, MD;  Location: [facility];  Service: General;  Laterality: N/A;   TONSILLECTOMY      Family History  Problem Relation Age of Onset   Stroke Mother    Heart disease Mother    Diabetes Mother    Heart disease Father    Cancer - Colon Father    Other Brother     Social history:  reports that she has never smoked. She has never used smokeless tobacco. She reports that she does not drink alcohol or use drugs.  Medications:  Prior to Admission medications   Medication Sig Start Date End Date Taking? Authorizing Provider  ALPRAZolam Duanne Moron) 0.5 MG tablet TAKE 1 TABLET BY MOUTH TWICE DAILY AS NEEDED FOR ANXIETY 03/08/19  Yes Kathyrn Drown, MD  aspirin-acetaminophen-caffeine (EXCEDRIN MIGRAINE) [phone] MG tablet Take by mouth every 6 (six) hours as needed for headache.   Yes [provider]  esomeprazole (NEXIUM) 20 MG capsule Take 20 mg by mouth daily.    Yes [provider]  famotidine (PEPCID) 20 MG tablet TAKE 1 TABLET BY MOUTH TWICE DAILY Patient taking differently: Takes only if Nexium does not help 02/22/19  Yes Luking, Scott A, MD  fluticasone (FLONASE) 50 MCG/ACT nasal spray INSTILL 2 SPRAYS INTO EACH NOSTRIL ONCE DAILY 03/21/19  Yes Luking, Scott A, MD  lisinopril (ZESTRIL) 5 MG tablet Take 0.5 tablets (2.5 mg total) by mouth daily. 03/21/19  Yes Kathyrn Drown, MD  loratadine (CLARITIN) 10 MG tablet Take 10 mg by mouth daily as needed for allergies.    Yes [provider]    oxyCODONE (OXY IR/ROXICODONE) 5 MG immediate release tablet Take 1 tablet (5 mg total) by mouth every 4 (four) hours as needed for severe pain. 03/21/19  Yes Luking, Scott A, MD  polyethylene glycol powder (GLYCOLAX/MIRALAX) powder Take 17 g by mouth daily.    Yes [provider]  pravastatin (PRAVACHOL) 20 MG tablet TAKE 1 TABLET BY MOUTH DAILY ON MONDAY, [facility] AND FRIDAYS 01/10/19  Yes Luking, Scott A, MD  tiZANidine (ZANAFLEX) 4 MG tablet TAKE 1/2 TO 1 TABLET BY MOUTH EVERY DAY AT NIGHT AS NEEDED FOR MUSCLE SPASMS 04/22/19  Yes Kathyrn Drown, MD  triamcinolone cream (KENALOG) 0.1 % Apply 1 application topically 2 (two) times daily. Patient taking differently: Apply 1 application topically daily as needed (rash).  01/08/16  Yes Luking, Elayne Snare, MD  hydrochlorothiazide (HYDRODIURIL) 25 MG tablet TAKE 1 TABLET(25 MG) BY MOUTH DAILY Patient not taking: Reported on 05/13/2019 12/25/18   Kathyrn Drown, MD  levothyroxine (SYNTHROID, LEVOTHROID) 25 MCG tablet TAKE 1 TABLET BY MOUTH EVERY DAY Patient not taking: Reported on 05/13/2019 11/22/18   Kathyrn Drown, MD  nortriptyline (PAMELOR) 10 MG capsule Take one capsule at night for 2 weeks, then take 2 capsules at night 05/13/19   Kathrynn Ducking, MD  tobramycin-dexamethasone [facility]) ophthalmic solution Place 1 drop into the right eye 4 (four) times daily. Up to 5 days Patient not taking: Reported on 05/13/2019 08/10/18   Kathyrn Drown, MD      Allergies  Allergen Reactions   Topamax [Topiramate] Anaphylaxis    Memory issues   Celexa [Citalopram Hydrobromide] Nausea And Vomiting   Fosamax [Alendronate Sodium]     Esophagitis    Neurontin [Gabapentin]     Drowsy    Soma [Carisoprodol]     dizzy    ROS:  Out of a complete 14 system review of symptoms, the patient complains only of the following symptoms, and all other reviewed systems are negative.  Neck stiffness Headache Hand numbness  Blood pressure 125/77, pulse 68,  temperature 97.8 F (36.6 C), temperature source Oral, height 5\' 7"  (1.702 m), weight 192 lb (87.1 kg).  Physical Exam  General: The patient is alert and cooperative at the time of the examination.  Eyes: Pupils are equal, round, and reactive to light. Discs are flat bilaterally.  Neck: The neck is supple, no carotid bruits are noted.  Respiratory: The respiratory examination is clear.  Cardiovascular: The cardiovascular examination reveals a regular rate and rhythm, no obvious murmurs or rubs are noted.  Neuromuscular: The patient lacks about 10 to 15 degrees of full lateral rotation of the cervical spine bilaterally.  There appears to be some elevation of the left shoulder as compared to the right.  Skin: Extremities are without significant edema.  Neurologic Exam  Mental status: The patient is alert and oriented x 3 at the time of the examination. The patient has apparent normal recent and remote memory, with an apparently normal attention span and concentration ability.  Cranial nerves: Facial symmetry is present. There is good sensation of the face to pinprick and soft touch bilaterally. The strength of the facial muscles and the muscles to head turning and shoulder shrug are normal bilaterally. Speech is well enunciated, no aphasia or dysarthria is noted. Extraocular movements are full. Visual fields are full. The tongue is midline, and the patient has symmetric elevation of the soft palate. No obvious hearing deficits are noted.  Motor: The motor testing reveals 5 over 5 strength of all 4 extremities. Good symmetric motor tone is noted throughout.  Sensory: Sensory testing is intact to pinprick, soft touch, vibration sensation, and position sense on all 4 extremities. No evidence of extinction is noted.  Coordination: Cerebellar testing reveals good finger-nose-finger and heel-to-shin bilaterally.  Gait and station: Gait is normal. Tandem gait is normal. Romberg is negative. No  drift is seen.  Reflexes: Deep tendon reflexes are symmetric and normal bilaterally. Toes are downgoing bilaterally.   MRI cervical 01/17/19:  IMPRESSION: No acute intracranial abnormality.  Scattered small white matter hyperintensities appear chronic. Differential includes chronic microvascular ischemia and chronic migraine headaches.  * MRI scan images were reviewed online. I agree with the written report.   MRI cervical 04/01/14:  IMPRESSION: 1. Interval  C5-6 ACDF without demonstrated complication. No residual spinal stenosis demonstrated. 2. Stable findings at C6-7 status post remote ACDF. 3. No acute findings. No explanation for progressive right radicular symptoms. Medial foraminal narrowing on the right at C3-4 appears unchanged.    Assessment/Plan:  1.  History of migraine headache  2.  Current cervicogenic headache  3.  History of cervical spine surgery  The patient appears to have a headache pattern consistent with a cervicogenic headache.  The headaches primarily originate in the base of the skull in the occipital area, and worsen as the day goes on eventually leading to headache on the vertex of the head and in the front of the head.  The patient will be placed on low-dose nortriptyline, previously she was unable to tolerate gabapentin.  She does take tizanidine at night which she believes does help.  She does not take tizanidine during the day.  She will follow-up here in 3 months.   Jill Alexanders MD 05/13/2019 11:51 AM  [facility] [address] [place], [place] [address]  Phone [phone] Fax [phone]

## 2019-05-13 NOTE — Patient Instructions (Signed)
We will start nortriptyline for the headache, please call for any dose adjustments.   Pamelor (nortriptyline) is an antidepressant medication that has many uses that may include headache, whiplash injuries, or for peripheral neuropathy pain. Side effects may include drowsiness, dry mouth, blurred vision, or constipation. As with any antidepressant medication, worsening depression may occur. If you had any significant side effects, please call our office. The full effects of this medication may take 7-10 days after starting the drug, or going up on the dose.

## 2019-05-27 ENCOUNTER — Other Ambulatory Visit: Payer: Self-pay | Admitting: *Deleted

## 2019-05-27 ENCOUNTER — Encounter: Payer: Self-pay | Admitting: Family Medicine

## 2019-05-27 MED ORDER — PREDNISONE 20 MG PO TABS: ORAL_TABLET | ORAL | 0 refills | Status: DC

## 2019-05-27 NOTE — Telephone Encounter (Signed)
I would recommend prednisone 20 mg, #12 3 tablets daily for 2 days then 2 tablets daily for 2 days then 1 tablet daily for 2 days

## 2019-05-27 NOTE — Telephone Encounter (Signed)
Called pt and discussed with her dr Nicki Reaper was prescribing prednisone and med sent to pharm. Pt verbalized understanding.

## 2019-06-07 DIAGNOSIS — D229 Melanocytic nevi, unspecified: Secondary | ICD-10-CM | POA: Diagnosis not present

## 2019-06-07 DIAGNOSIS — D485 Neoplasm of uncertain behavior of skin: Secondary | ICD-10-CM | POA: Diagnosis not present

## 2019-06-17 ENCOUNTER — Other Ambulatory Visit: Payer: Self-pay

## 2019-06-17 ENCOUNTER — Ambulatory Visit (INDEPENDENT_AMBULATORY_CARE_PROVIDER_SITE_OTHER): Payer: Medicare Other | Admitting: Family Medicine

## 2019-06-17 DIAGNOSIS — G8929 Other chronic pain: Secondary | ICD-10-CM

## 2019-06-17 DIAGNOSIS — M545 Low back pain: Secondary | ICD-10-CM | POA: Diagnosis not present

## 2019-06-17 DIAGNOSIS — E7849 Other hyperlipidemia: Secondary | ICD-10-CM

## 2019-06-17 DIAGNOSIS — E038 Other specified hypothyroidism: Secondary | ICD-10-CM

## 2019-06-17 DIAGNOSIS — K219 Gastro-esophageal reflux disease without esophagitis: Secondary | ICD-10-CM

## 2019-06-17 DIAGNOSIS — I10 Essential (primary) hypertension: Secondary | ICD-10-CM | POA: Diagnosis not present

## 2019-06-17 DIAGNOSIS — F411 Generalized anxiety disorder: Secondary | ICD-10-CM

## 2019-06-17 MED ORDER — OXYCODONE HCL 5 MG PO TABS: 5.0000 mg | ORAL_TABLET | ORAL | 0 refills | Status: DC | PRN

## 2019-06-17 MED ORDER — OXYCODONE HCL 5 MG PO TABS: ORAL_TABLET | ORAL | 0 refills | Status: DC

## 2019-06-17 MED ORDER — ALPRAZOLAM 0.5 MG PO TABS: ORAL_TABLET | ORAL | 5 refills | Status: DC

## 2019-06-17 NOTE — Progress Notes (Signed)
Subjective:  Phone visit Video not possible  Patient ID: Amanda Osborne, female    DOB: 07-Oct-1951, 68 y.o.   MRN: [id number]  Hypertension This is a chronic problem. Pertinent negatives include no chest pain or shortness of breath. There are no compliance problems.   Patient has thyroid condition.  Takes thyroid medication on a regular basis.  States that the proper way.  Relates compliance.  States no negative side effects.  States condition seems to be under good control.  Patient does have ongoing trouble with reflux.  Takes medication on a regular basis.  Tries to minimize foods as best they can.  They understand the importance of dietary compliance.  May also try to avoid eating a large meal close to bedtime.  Patient denies any dysphagia denies hematochezia.  States medicine does a good job keeping the problem under good control.  Without the medication may certainly have issues.They desire to continue taking their medication.   Patient has thyroid condition.  Takes thyroid medication on a regular basis.  States that the proper way.  Relates compliance.  States no negative side effects.  States condition seems to be under good control. Patient does not feel she has not issue we have to be taking medication she requests lab testing  This patient was seen today for chronic pain  The medication list was reviewed and updated.   -Compliance with medication: She is compliant with her medicine does not take it more than what is stated  - Number patient states they take daily: 6/day  -when was the last dose patient took?  Earlier today  The patient was advised the importance of maintaining medication and not using illegal substances with these.  Here for refills and follow up  The patient was educated that we can provide 3 monthly scripts for their medication, it is their responsibility to follow the instructions.  Side effects or complications from medications: Denies any side  effects with medicine  Patient is aware that pain medications are meant to minimize the severity of the pain to allow their pain levels to improve to allow for better function. They are aware of that pain medications cannot totally remove their pain.  Due for UDT ( at least once per year) : Later this year   25 minutes was spent with the patient.  This statement verifies that 25 minutes was indeed spent with the patient.  More than 50% of this visit-total duration of the visit-was spent in counseling and coordination of care. The issues that the patient came in for today as reflected in the diagnosis (s) please refer to documentation for further details.      Pt states she would like to have bloodwork done to check her thyroid.   Virtual Visit via Video Note  I connected with Amanda Osborne on 06/17/19 at 10:00 AM EDT by a video enabled telemedicine application and verified that I am speaking with the correct person using two identifiers.  Location: Patient: home Provider: office   I discussed the limitations of evaluation and management by telemedicine and the availability of in person appointments. The patient expressed understanding and agreed to proceed.  History of Present Illness:    Observations/Objective:   Assessment and Plan:   Follow Up Instructions:    I discussed the assessment and treatment plan with the patient. The patient was provided an opportunity to ask questions and all were answered. The patient agreed with the plan and demonstrated an understanding  of the instructions.   The patient was advised to call back or seek an in-person evaluation if the symptoms worsen or if the condition fails to improve as anticipated.  I provided 25 minutes of non-face-to-face time during this encounter.   Vicente Males, LPN    Review of Systems  Constitutional: Negative for activity change, appetite change and fatigue.  HENT: Negative for congestion and rhinorrhea.    Respiratory: Negative for cough and shortness of breath.   Cardiovascular: Negative for chest pain and leg swelling.  Gastrointestinal: Negative for abdominal pain and diarrhea.  Endocrine: Negative for polydipsia and polyphagia.  Musculoskeletal: Positive for back pain. Negative for arthralgias.  Skin: Negative for color change.  Neurological: Negative for dizziness and weakness.  Psychiatric/Behavioral: Negative for behavioral problems and confusion.       Objective:   Physical Exam   Today's visit was via telephone Physical exam was not possible for this visit      Assessment & Plan:  1. HTN (hypertension), benign HTN- Patient was seen today as part of a visit regarding hypertension. The importance of healthy diet and regular physical activity was discussed. The importance of compliance with medications discussed.  Ideal goal is to keep blood pressure low elevated levels certainly below 174/08 when possible.  The patient was counseled that keeping blood pressure under control lessen his risk of complications.  The importance of regular follow-ups was discussed with the patient.  Low-salt diet such as DASH recommended.  Regular physical activity was recommended as well.  Patient was advised to keep regular follow-ups.   2. Gastroesophageal reflux disease without esophagitis The patient was seen today for GERD. Patient benefits from medication. Patient to continue medication. Keep all regular follow ups.   3. Other specified hypothyroidism She will check thyroid and labs not taking medication currently  4. Chronic bilateral low back pain, unspecified whether sciatica present The patient was seen in followup for chronic pain. A review over at their current pain status was discussed. Drug registry was checked. Prescriptions were given. Discussion was held regarding the importance of compliance with medication as well as pain medication contract.  Time for questions  regarding pain management plan occurred. Importance of regular followup visits was discussed. Patient was informed that medication may cause drowsiness and should not be combined  with other medications/alcohol or street drugs. Patient was cautioned that medication could cause drowsiness. If the patient feels medication is causing altered alertness then do not drive or operate dangerous equipment.    5. Generalized anxiety disorder She is currently on Xanax twice a day she states she does not take pain medicine near bedtime she denies drowsiness is been on this for years unlikely she will ever come off of it  6. Other hyperlipidemia The patient was seen today as part of an evaluation regarding hyperlipidemia.  Recent lab work has been reviewed with the patient as well as the goals for good cholesterol care.  In addition to this medications have been discussed the importance of compliance with diet and medications discussed as well.  Finally the patient is aware that poor control of cholesterol, noncompliance can dramatically increase the risk of complications. The patient will keep regular office visits and the patient does agreed to periodic lab work.   25 minutes was spent with the patient.  This statement verifies that 25 minutes was indeed spent with the patient.  More than 50% of this visit-total duration of the visit-was spent in counseling and coordination  of care. The issues that the patient came in for today as reflected in the diagnosis (s) please refer to documentation for further details.

## 2019-06-19 DIAGNOSIS — D225 Melanocytic nevi of trunk: Secondary | ICD-10-CM | POA: Diagnosis not present

## 2019-07-21 ENCOUNTER — Other Ambulatory Visit: Payer: Self-pay | Admitting: Family Medicine

## 2019-08-12 ENCOUNTER — Ambulatory Visit (INDEPENDENT_AMBULATORY_CARE_PROVIDER_SITE_OTHER): Payer: Medicare Other | Admitting: Family Medicine

## 2019-08-12 DIAGNOSIS — N3000 Acute cystitis without hematuria: Secondary | ICD-10-CM | POA: Diagnosis not present

## 2019-08-12 MED ORDER — CEPHALEXIN 500 MG PO CAPS: 500.0000 mg | ORAL_CAPSULE | Freq: Four times a day (QID) | ORAL | 0 refills | Status: DC

## 2019-08-12 NOTE — Progress Notes (Signed)
   Subjective:    Patient ID: Amanda Osborne, female    DOB: [birth year], 68 y.o.   MRN: [id number]  HPI Patient with dysuria urinary frequency slight flank pain denies abdominal pain denies hematuria no wheezing or difficulty breathing PMH benign Patient calls with dysuria for a few days . Patient states she feels like she has to pee every 5-8 minutes and feels like she has a bladder infection. Patient states she has tried cranberry juice for a few days She relates dysuria urinary frequency past several days lower abdominal pain denies high fever chills sweats denies flank pain. Virtual Visit via Video Note  I connected with Amanda Osborne on 08/12/19 at  3:00 PM EDT by a video enabled telemedicine application and verified that I am speaking with the correct person using two identifiers.  Location: Patient: home Provider: office   I discussed the limitations of evaluation and management by telemedicine and the availability of in person appointments. The patient expressed understanding and agreed to proceed.  History of Present Illness:    Observations/Objective:   Assessment and Plan:   Follow Up Instructions:    I discussed the assessment and treatment plan with the patient. The patient was provided an opportunity to ask questions and all were answered. The patient agreed with the plan and demonstrated an understanding of the instructions.   The patient was advised to call back or seek an in-person evaluation if the symptoms worsen or if the condition fails to improve as anticipated.  I provided 12 minutes of non-face-to-face time during this encounter.     Review of Systems  Constitutional: Negative for activity change, appetite change and fatigue.  HENT: Negative for congestion and rhinorrhea.   Respiratory: Negative for cough and shortness of breath.   Cardiovascular: Negative for chest pain and leg swelling.  Gastrointestinal: Negative for abdominal pain and  diarrhea.  Endocrine: Negative for polydipsia and polyphagia.  Genitourinary: Positive for dysuria and frequency. Negative for flank pain and hematuria.  Skin: Negative for color change.  Neurological: Negative for dizziness and weakness.  Psychiatric/Behavioral: Negative for behavioral problems and confusion.       Objective:   Physical Exam  Today's visit was via telephone Physical exam was not possible for this visit       Assessment & Plan:  UTI Antibiotic prescribed Warning signs discussed Follow-up if progressive troubles

## 2019-08-13 ENCOUNTER — Ambulatory Visit: Payer: Medicare Other | Admitting: Neurology

## 2019-08-18 ENCOUNTER — Other Ambulatory Visit: Payer: Self-pay | Admitting: Family Medicine

## 2019-09-01 ENCOUNTER — Other Ambulatory Visit: Payer: Self-pay | Admitting: Family Medicine

## 2019-09-09 DIAGNOSIS — R5383 Other fatigue: Secondary | ICD-10-CM | POA: Diagnosis not present

## 2019-09-09 DIAGNOSIS — Z79899 Other long term (current) drug therapy: Secondary | ICD-10-CM | POA: Diagnosis not present

## 2019-09-09 DIAGNOSIS — E7800 Pure hypercholesterolemia, unspecified: Secondary | ICD-10-CM | POA: Diagnosis not present

## 2019-09-09 DIAGNOSIS — Z7189 Other specified counseling: Secondary | ICD-10-CM | POA: Diagnosis not present

## 2019-09-09 DIAGNOSIS — M542 Cervicalgia: Secondary | ICD-10-CM | POA: Diagnosis not present

## 2019-09-09 DIAGNOSIS — M129 Arthropathy, unspecified: Secondary | ICD-10-CM | POA: Diagnosis not present

## 2019-09-09 DIAGNOSIS — Z1159 Encounter for screening for other viral diseases: Secondary | ICD-10-CM | POA: Diagnosis not present

## 2019-09-13 ENCOUNTER — Other Ambulatory Visit: Payer: Self-pay | Admitting: Family Medicine

## 2019-09-13 ENCOUNTER — Encounter: Payer: Self-pay | Admitting: Family Medicine

## 2019-09-13 MED ORDER — ALPRAZOLAM 1 MG PO TABS: ORAL_TABLET | ORAL | 0 refills | Status: DC

## 2019-09-16 ENCOUNTER — Encounter: Payer: Self-pay | Admitting: Family Medicine

## 2019-09-16 DIAGNOSIS — E7849 Other hyperlipidemia: Secondary | ICD-10-CM | POA: Diagnosis not present

## 2019-09-16 DIAGNOSIS — E038 Other specified hypothyroidism: Secondary | ICD-10-CM | POA: Diagnosis not present

## 2019-09-16 DIAGNOSIS — D509 Iron deficiency anemia, unspecified: Secondary | ICD-10-CM | POA: Diagnosis not present

## 2019-09-16 DIAGNOSIS — I10 Essential (primary) hypertension: Secondary | ICD-10-CM | POA: Diagnosis not present

## 2019-09-17 ENCOUNTER — Ambulatory Visit (INDEPENDENT_AMBULATORY_CARE_PROVIDER_SITE_OTHER): Payer: Medicare Other | Admitting: Family Medicine

## 2019-09-17 ENCOUNTER — Other Ambulatory Visit: Payer: Self-pay

## 2019-09-17 ENCOUNTER — Encounter: Payer: Self-pay | Admitting: Family Medicine

## 2019-09-17 VITALS — BP 110/62 | Temp 97.4°F | Ht 67.0 in | Wt 187.0 lb

## 2019-09-17 DIAGNOSIS — R102 Pelvic and perineal pain: Secondary | ICD-10-CM

## 2019-09-17 DIAGNOSIS — Z1211 Encounter for screening for malignant neoplasm of colon: Secondary | ICD-10-CM | POA: Diagnosis not present

## 2019-09-17 DIAGNOSIS — M545 Low back pain, unspecified: Secondary | ICD-10-CM

## 2019-09-17 DIAGNOSIS — G8929 Other chronic pain: Secondary | ICD-10-CM

## 2019-09-17 DIAGNOSIS — E038 Other specified hypothyroidism: Secondary | ICD-10-CM | POA: Diagnosis not present

## 2019-09-17 DIAGNOSIS — I10 Essential (primary) hypertension: Secondary | ICD-10-CM

## 2019-09-17 DIAGNOSIS — E611 Iron deficiency: Secondary | ICD-10-CM | POA: Diagnosis not present

## 2019-09-17 DIAGNOSIS — D509 Iron deficiency anemia, unspecified: Secondary | ICD-10-CM

## 2019-09-17 LAB — LIPID PANEL
Chol/HDL Ratio: 2.7 ratio (ref 0.0–4.4)
Cholesterol, Total: 175 mg/dL (ref 100–199)
HDL: 66 mg/dL (ref >39)
LDL Chol Calc (NIH): 93 mg/dL (ref 0–99)
Triglycerides: 85 mg/dL (ref 0–149)
VLDL Cholesterol Cal: 16 mg/dL (ref 5–40)

## 2019-09-17 LAB — BASIC METABOLIC PANEL
BUN/Creatinine Ratio: 40 — ABNORMAL HIGH (ref 12–28)
BUN: 22 mg/dL (ref 8–27)
CO2: 26 mmol/L (ref 20–29)
Calcium: 10 mg/dL (ref 8.7–10.3)
Chloride: 98 mmol/L (ref 96–106)
Creatinine, Ser: 0.55 mg/dL — ABNORMAL LOW (ref 0.57–1.00)
GFR calc Af Amer: 111 mL/min/{1.73_m2} (ref >59)
GFR calc non Af Amer: 97 mL/min/{1.73_m2} (ref >59)
Glucose: 82 mg/dL (ref 65–99)
Potassium: 4.3 mmol/L (ref 3.5–5.2)
Sodium: 137 mmol/L (ref 134–144)

## 2019-09-17 LAB — TSH: TSH: 7.27 u[IU]/mL — ABNORMAL HIGH (ref 0.450–4.500)

## 2019-09-17 LAB — FERRITIN: Ferritin: 10 ng/mL — ABNORMAL LOW (ref 15–150)

## 2019-09-17 LAB — T4, FREE: Free T4: 1.32 ng/dL (ref 0.82–1.77)

## 2019-09-17 MED ORDER — OXYCODONE HCL 5 MG PO TABS: ORAL_TABLET | ORAL | 0 refills | Status: DC

## 2019-09-17 MED ORDER — OXYCODONE HCL 5 MG PO TABS: 5.0000 mg | ORAL_TABLET | ORAL | 0 refills | Status: DC | PRN

## 2019-09-17 NOTE — Progress Notes (Signed)
Subjective:    Patient ID: Amanda Osborne, female    DOB: Jun 10, 1951, 68 y.o.   MRN: [id number]  HPI This patient was seen today for chronic pain. Takes for joint pain  The medication list was reviewed and updated.   -Compliance with medication: takes every 3 hours 6 times a day  - Number patient states they take daily: 6 a day  -when was the last dose patient took? Today  The patient was advised the importance of maintaining medication and not using illegal substances with these.  Here for refills and follow up  The patient was educated that we can provide 3 monthly scripts for their medication, it is their responsibility to follow the instructions.  Side effects or complications from medications: none  Patient is aware that pain medications are meant to minimize the severity of the pain to allow their pain levels to improve to allow for better function. They are aware of that pain medications cannot totally remove their pain.  Due for UDT ( at least once per year) : last one 12/25/18  Wants to get medical records from [facility] for neck and shoulder pain. States they took 4 -5 vials of blood and she does not know why they took blood and they never called her with results. Also they did neck xray. Was told xray looked good.         Review of Systems  Constitutional: Negative for activity change, appetite change and fatigue.  HENT: Negative for congestion and rhinorrhea.   Respiratory: Negative for cough and shortness of breath.   Cardiovascular: Negative for chest pain and leg swelling.  Gastrointestinal: Negative for abdominal pain and diarrhea.  Endocrine: Negative for polydipsia and polyphagia.  Skin: Negative for color change.  Neurological: Negative for dizziness and weakness.  Psychiatric/Behavioral: Negative for behavioral problems and confusion.       Objective:   Physical Exam Vitals signs reviewed.  Constitutional:      General: She is  not in acute distress. HENT:     Head: Normocephalic and atraumatic.  Eyes:     General:        Right eye: No discharge.        Left eye: No discharge.  Neck:     Trachea: No tracheal deviation.  Cardiovascular:     Rate and Rhythm: Normal rate and regular rhythm.     Heart sounds: Normal heart sounds. No murmur.  Pulmonary:     Effort: Pulmonary effort is normal. No respiratory distress.     Breath sounds: Normal breath sounds.  Lymphadenopathy:     Cervical: No cervical adenopathy.  Skin:    General: Skin is warm and dry.  Neurological:     Mental Status: She is alert.     Coordination: Coordination normal.  Psychiatric:        Behavior: Behavior normal.    Results for orders placed or performed in visit on 03/21/19  T4, free  Result Value Ref Range   Free T4 1.32 0.82 - 1.77 ng/dL  TSH  Result Value Ref Range   TSH 7.270 (H) 0.450 - 4.500 uIU/mL  Basic metabolic panel  Result Value Ref Range   Glucose 82 65 - 99 mg/dL   BUN 22 8 - 27 mg/dL   Creatinine, Ser 0.55 (L) 0.57 - 1.00 mg/dL   GFR calc non Af Amer 97 >59 mL/min/1.73   GFR calc Af Amer 111 >59 mL/min/1.73   BUN/Creatinine Ratio  40 (H) 12 - 28   Sodium 137 134 - 144 mmol/L   Potassium 4.3 3.5 - 5.2 mmol/L   Chloride 98 96 - 106 mmol/L   CO2 26 20 - 29 mmol/L   Calcium 10.0 8.7 - 10.3 mg/dL  Lipid panel  Result Value Ref Range   Cholesterol, Total 175 100 - 199 mg/dL   Triglycerides 85 0 - 149 mg/dL   HDL 66 >39 mg/dL   VLDL Cholesterol Cal 16 5 - 40 mg/dL   LDL Chol Calc (NIH) 93 0 - 99 mg/dL   Chol/HDL Ratio 2.7 0.0 - 4.4 ratio  Ferritin  Result Value Ref Range   Ferritin 10 (L) 15 - 150 ng/mL     25 minutes was spent with the patient.  This statement verifies that 25 minutes was indeed spent with the patient.  More than 50% of this visit-total duration of the visit-was spent in counseling and coordination of care. The issues that the patient came in for today as reflected in the diagnosis (s)  please refer to documentation for further details.      Assessment & Plan:  1. Iron deficiency Check stool test for blood she does not want to do colonoscopy currently  2. Pelvic pain She relates pelvic fullness and discomfort we will go ahead with ultrasound of the pelvis - US PELVIS (TRANSABDOMINAL ONLY)  3. Screen for colon cancer Stool test for blood as discussed above - IFOBT POC (occult bld, rslt in office); Future  4. HTN (hypertension), benign Blood pressure good control continue current measures  5. Other specified hypothyroidism Resume thyroid medicine patient stopped it because she just felt overwhelmed she will go ahead and restart it  6. Iron deficiency anemia, unspecified iron deficiency anemia type See above ferritin is low The patient does not want to do colonoscopy currently but we will go ahead and do a stool test for blood if positive she will need to see gastroenterology  7. Chronic bilateral low back pain, unspecified whether sciatica present Pain medications prescribed patient takes these on a regular basis does not abuse of medications  The patient was seen in followup for chronic pain. A review over at their current pain status was discussed. Drug registry was checked. Prescriptions were given. Discussion was held regarding the importance of compliance with medication as well as pain medication contract.  Time for questions regarding pain management plan occurred. Importance of regular followup visits was discussed. Patient was informed that medication may cause drowsiness and should not be combined  with other medications/alcohol or street drugs. Patient was cautioned that medication could cause drowsiness. If the patient feels medication is causing altered alertness then do not drive or operate dangerous equipment.

## 2019-09-19 ENCOUNTER — Telehealth: Payer: Self-pay | Admitting: Family Medicine

## 2019-09-19 NOTE — Telephone Encounter (Signed)
ERROR

## 2019-09-20 ENCOUNTER — Telehealth: Payer: Self-pay | Admitting: *Deleted

## 2019-09-20 ENCOUNTER — Other Ambulatory Visit: Payer: Self-pay | Admitting: *Deleted

## 2019-09-20 DIAGNOSIS — Z1211 Encounter for screening for malignant neoplasm of colon: Secondary | ICD-10-CM

## 2019-09-20 LAB — IFOBT (OCCULT BLOOD): IFOBT: POSITIVE

## 2019-09-20 NOTE — Telephone Encounter (Signed)
Positive hemosure. See result

## 2019-09-23 NOTE — Telephone Encounter (Signed)
Please see result note I recommended referral to Dr.[name] regarding this issue thank you

## 2019-09-23 NOTE — Telephone Encounter (Signed)
Sent pt a Therapist, music. When I spoke with pt earlier she said to sent mychart message

## 2019-09-26 ENCOUNTER — Ambulatory Visit (HOSPITAL_COMMUNITY)
Admission: RE | Admit: 2019-09-26 | Discharge: 2019-09-26 | Disposition: A | Discharge status: Home / Self Care | Payer: Medicare Other | Source: Ambulatory Visit | Attending: Family Medicine | Admitting: Family Medicine

## 2019-09-26 ENCOUNTER — Other Ambulatory Visit: Payer: Self-pay

## 2019-09-26 DIAGNOSIS — R102 Pelvic and perineal pain: Secondary | ICD-10-CM | POA: Insufficient documentation

## 2019-09-26 MED ORDER — LEVOTHYROXINE SODIUM 25 MCG PO TABS: ORAL_TABLET | ORAL | 1 refills | Status: DC

## 2019-09-30 ENCOUNTER — Encounter: Payer: Self-pay | Admitting: Family Medicine

## 2019-10-01 ENCOUNTER — Encounter: Payer: Self-pay | Admitting: Family Medicine

## 2019-10-17 ENCOUNTER — Other Ambulatory Visit: Payer: Self-pay | Admitting: Family Medicine

## 2019-10-23 ENCOUNTER — Encounter: Payer: Self-pay | Admitting: Family Medicine

## 2019-10-23 ENCOUNTER — Other Ambulatory Visit: Payer: Self-pay

## 2019-10-23 ENCOUNTER — Ambulatory Visit (INDEPENDENT_AMBULATORY_CARE_PROVIDER_SITE_OTHER): Payer: Medicare Other | Admitting: Family Medicine

## 2019-10-23 DIAGNOSIS — Z20822 Contact with and (suspected) exposure to covid-19: Secondary | ICD-10-CM

## 2019-10-23 DIAGNOSIS — J329 Chronic sinusitis, unspecified: Secondary | ICD-10-CM | POA: Diagnosis not present

## 2019-10-23 DIAGNOSIS — J310 Chronic rhinitis: Secondary | ICD-10-CM

## 2019-10-23 DIAGNOSIS — Z20828 Contact with and (suspected) exposure to other viral communicable diseases: Secondary | ICD-10-CM

## 2019-10-23 MED ORDER — CEFDINIR 300 MG PO CAPS: 300.0000 mg | ORAL_CAPSULE | Freq: Two times a day (BID) | ORAL | 0 refills | Status: DC

## 2019-10-23 NOTE — Progress Notes (Signed)
   Subjective:  Audio only  Patient ID: Amanda Osborne, female    DOB: 04-18-1951, 68 y.o.   MRN: [id number]  Sinus Problem This is a new problem. The current episode started in the past 7 days. Associated symptoms include congestion, a hoarse voice and sinus pressure.   Low grade fever and teeth pain   Review of Systems  HENT: Positive for congestion, hoarse voice and sinus pressure.    Virtual Visit via Video Note  I connected with Amanda Osborne on 10/23/19 at 11:00 AM EST by a video enabled telemedicine application and verified that I am speaking with the correct person using two identifiers.  Location: Patient:  Provider:    I discussed the limitations of evaluation and management by telemedicine and the availability of in person appointments. The patient expressed understanding and agreed to proceed.  History of Present Illness:    Observations/Objective:   Assessment and Plan:   Follow Up Instructions:    I discussed the assessment and treatment plan with the patient. The patient was provided an opportunity to ask questions and all were answered. The patient agreed with the plan and demonstrated an understanding of the instructions.   The patient was advised to call back or seek an in-person evaluation if the symptoms worsen or if the condition fails to improve as anticipated.  I provided 18 minutes of non-face-to-face time during this encounter.  Takes claritin in fall  This sat noted some inflam in the upper teeth  Sun teeth was sensitive  Takes immune factor from a dr Roanna Raider   Swishing mouth out with saline  Running fever at night  Pt notes a bit hoarse  A little cough  No bad coughing smell  Not around anyone  t giving day  Did not get together      Objective:   Physical Exam  Virtual      Assessment & Plan:  Respiratory symptoms similar to prior sinus infections.  Antibiotics prescribed.  Symptom care discussed.  Potential  for Covid discussed.  Will do testing rationale discussed cautionary measures discussed

## 2019-10-24 ENCOUNTER — Encounter (INDEPENDENT_AMBULATORY_CARE_PROVIDER_SITE_OTHER): Payer: Self-pay | Admitting: Gastroenterology

## 2019-10-24 ENCOUNTER — Other Ambulatory Visit: Payer: Self-pay

## 2019-10-24 DIAGNOSIS — Z20822 Contact with and (suspected) exposure to covid-19: Secondary | ICD-10-CM

## 2019-10-26 LAB — NOVEL CORONAVIRUS, NAA: SARS-CoV-2, NAA: NOT DETECTED

## 2019-11-28 ENCOUNTER — Encounter (INDEPENDENT_AMBULATORY_CARE_PROVIDER_SITE_OTHER): Payer: Self-pay

## 2019-12-04 ENCOUNTER — Other Ambulatory Visit: Payer: Self-pay

## 2019-12-04 ENCOUNTER — Encounter (INDEPENDENT_AMBULATORY_CARE_PROVIDER_SITE_OTHER): Payer: Self-pay | Admitting: Gastroenterology

## 2019-12-04 ENCOUNTER — Ambulatory Visit (INDEPENDENT_AMBULATORY_CARE_PROVIDER_SITE_OTHER): Payer: Medicare Other | Admitting: Gastroenterology

## 2019-12-04 VITALS — Temp 99.2°F | Ht 66.5 in | Wt 188.0 lb

## 2019-12-04 DIAGNOSIS — D509 Iron deficiency anemia, unspecified: Secondary | ICD-10-CM

## 2019-12-04 NOTE — Progress Notes (Signed)
I connected with  FRITZIE LINDH on 12/04/19 by a video enabled telemedicine application and verified that I am speaking with the correct person using two identifiers.   I discussed the limitations of evaluation and management by telemedicine. The patient expressed understanding and agreed to proceed. 20 min on phone w/ patient.    Patient profile: Amanda Osborne is a 69 y.o. female seen for evaluation of positive iFOBT . Last seen in clinic 2016   She reports chronic GERD symptoms - usually well controlled w/ nexium 20mg  daily in AM. She takes pepcid 1-2x/day if she is symptomatic and feels she needs to burp and unable to, pepcid helps her burp and relieves her symptoms. She denies any nausea. She reports vomiting or spitting up a clear mucus fluid at times if she overeats-chronic issue since gastric bypass. Sometimes has mild dysphagia to steak in lower esopahgeal area, liquids and pills do okay. Denies esophageal burning or epigastric pain.   She reports her bowel habits are usually daily as long as she takes MiraLAX. She does have significant straining if she misses MiraLAX.  She reports 15 year history of hemorrhoid symptoms which are unchanged recently-tends to be painless bright red blood in stool.  She does not believe that she had blood in the stool that she collected that was positive for Hemoccult.  She denies any abdominal pain.  Feels her appetite is good.  She reports being on slow release iron for many years, remote history of needing iron infusions.  Takes ibuprofen 400mg  TID for joint symptoms to avoid opioids, does try to take w/ food.  Non-smoker no alcohol.  History of gastric bypass 1990   Wt Readings from Last 3 Encounters:  12/04/19 188 lb (85.3 kg)  09/17/19 187 lb (84.8 kg)  05/13/19 192 lb (87.1 kg)     Last Colonoscopy: per patient [place] 10 years ago.    Last Endoscopy: 2016--Impression: Healed esophagitis. High-grade stricture at GE junction was  dilated to 15 mm balloon dilator. Moderate size gastric pouch. Gastric ulcer is healed. Near complete healing of jejunal ulcers. Noncritical jejunal narrowing   Past Medical History:  Past Medical History:  Diagnosis Date  . Anxiety   . Arthritis   . Cervical disc herniation    c5 and c6  . Cervicogenic headache 05/13/2019  . Chronic pain   . Complication of anesthesia   . Fibromyalgia   . GERD (gastroesophageal reflux disease)   . Headache(784.0)    Hx: of migraines until age 32's  . Hyperlipidemia   . Hypertension   . Iron deficiency anemia 11/30/2012  . Left carpal tunnel syndrome 11/07/2018  . Narcolepsy   . Occipital neuralgia   . PONV (postoperative nausea and vomiting)    DURING FIRST NECK SURGERY ; BUT REPORT IT MAY HAVE BEEN THE DILAUDID THAT CAUSED THE VOITING I  . Unspecified vitamin D deficiency 03/26/2013  . Wrist fracture, bilateral 2005 and 2010   right then left    Problem List: Patient Active Problem List   Diagnosis Date Noted  . Cervicogenic headache 05/13/2019  . Left carpal tunnel syndrome 11/07/2018  . Incisional hernia 06/14/2018  . Hypothyroid 02/27/2018  . IDA (iron deficiency anemia) 08/10/2017  . GERD (gastroesophageal reflux disease) 05/01/2015  . Osteoporosis 12/22/2014  . Occipital neuralgia 09/16/2014  . Spasmodic torticollis 09/16/2014  . HTN (hypertension), benign 03/17/2014  . Thyroid nodule 03/17/2014  . Right ankle pain 12/20/2013  . Spondylosis, cervical, with myelopathy C5-6 08/06/2013  .  Kyphosis 05/02/2013  . Hyperlipidemia 05/02/2013  . Chronic back pain 03/29/2013  . Generalized anxiety disorder 03/29/2013  . Depression with anxiety 03/29/2013  . H/O gastric bypass 03/29/2013  . Unspecified vitamin D deficiency 03/26/2013  . Iron deficiency anemia 11/30/2012    Past Surgical History: Past Surgical History:  Procedure Laterality Date  . ANTERIOR CERVICAL DECOMP/DISCECTOMY FUSION  2000   C6-C7 DR Ellene Route   . ANTERIOR  CERVICAL DECOMP/DISCECTOMY FUSION N/A 08/05/2013   Procedure: Cervical five-six Anterior cervical decompression/diskectomy/fusion/ Synthes plate removal;  Surgeon: Kristeen Miss, MD;  Location: [facility];  Service: Neurosurgery;  Laterality: N/A;  . BONE GRAFT HIP ILIAC CREST  2006   to right wrist to correct fracture, DR. Reader ORTHO   . COLONOSCOPY     Hx; of  . DILATION AND CURETTAGE OF UTERUS    . ESOPHAGEAL DILATION N/A 10/22/2015   Procedure: ESOPHAGEAL DILATION;  Surgeon: Rogene Houston, MD;  Location: [facility];  Service: Endoscopy;  Laterality: N/A;  . ESOPHAGOGASTRODUODENOSCOPY N/A 08/28/2015   Procedure: ESOPHAGOGASTRODUODENOSCOPY (EGD);  Surgeon: Rogene Houston, MD;  Location: [facility];  Service: Endoscopy;  Laterality: N/A;  910am  . ESOPHAGOGASTRODUODENOSCOPY N/A 10/22/2015   Procedure: ESOPHAGOGASTRODUODENOSCOPY (EGD);  Surgeon: Rogene Houston, MD;  Location: [facility];  Service: Endoscopy;  Laterality: N/A;  12:00  . FRACTURE SURGERY     Hx: of right wrist surgery  . GASTRIC BYPASS  1981  . INCISIONAL HERNIA REPAIR N/A 06/14/2018   Procedure: LAPAROSCOPIC INCISIONAL HERNIA REPAIR WITH INSERTION OF MESH ERAS PATHWAY;  Surgeon: Kinsinger, Arta Bruce, MD;  Location: [facility];  Service: General;  Laterality: N/A;  . TONSILLECTOMY      Allergies: Allergies  Allergen Reactions  . Topamax [Topiramate] Anaphylaxis    Memory issues  . Celexa [Citalopram Hydrobromide] Nausea And Vomiting  . Fosamax [Alendronate Sodium]     Esophagitis   . Neurontin [Gabapentin]     Drowsy   . Soma [Carisoprodol]     dizzy      Home Medications:  Current Outpatient Medications:  .  ALPRAZolam (XANAX) 0.5 MG tablet, TAKE 1 TABLET BY MOUTH TWICE DAILY AS NEEDED FOR ANXIETY, Disp: 60 tablet, Rfl: 5 .  esomeprazole (NEXIUM) 20 MG capsule, Take 20 mg by mouth daily. , Disp: , Rfl:  .  famotidine (PEPCID) 20 MG tablet, TAKE 1 TABLET BY MOUTH TWICE DAILY, Disp: 60 tablet, Rfl:  2 .  fluticasone (FLONASE) 50 MCG/ACT nasal spray, INSTILL 2 SPRAYS INTO EACH NOSTRIL ONCE DAILY, Disp: 16 g, Rfl: 5 .  hydrochlorothiazide (HYDRODIURIL) 25 MG tablet, TAKE 1 TABLET(25 MG) BY MOUTH DAILY, Disp: 90 tablet, Rfl: 1 .  levothyroxine (SYNTHROID) 25 MCG tablet, Take 7 tablets po each Thursday, Disp: 90 tablet, Rfl: 1 .  lisinopril (ZESTRIL) 5 MG tablet, Take 0.5 tablets (2.5 mg total) by mouth daily., Disp: 90 tablet, Rfl: 1 .  loratadine (CLARITIN) 10 MG tablet, Take 10 mg by mouth daily as needed for allergies. , Disp: , Rfl:  .  nortriptyline (PAMELOR) 10 MG capsule, Take one capsule at night for 2 weeks, then take 2 capsules at night, Disp: 60 capsule, Rfl: 3 .  oxyCODONE (OXY IR/ROXICODONE) 5 MG immediate release tablet, Take 1 tablet (5 mg total) by mouth every 4 (four) hours as needed for severe pain., Disp: 180 tablet, Rfl: 0 .  oxyCODONE (ROXICODONE) 5 MG immediate release tablet, Take 1 tablet (5 mg total) by mouth every 4 (four) hours  as needed for severe pain, Disp: 180 tablet, Rfl: 0 .  oxyCODONE (ROXICODONE) 5 MG immediate release tablet, Take 1 tablet (5 mg total) by mouth every 4 (four) hours as needed for severe pain, Disp: 180 tablet, Rfl: 0 .  polyethylene glycol powder (GLYCOLAX/MIRALAX) powder, Take 17 g by mouth daily. , Disp: , Rfl:  .  pravastatin (PRAVACHOL) 20 MG tablet, TAKE 1 TABLET EVERY DAY ON MONDAY WEDNESDAYS AND FRIDAYS, Disp: 12 tablet, Rfl: 5 .  tiZANidine (ZANAFLEX) 4 MG tablet, TAKE 1/2 TO 1 TABLET BY MOUTH EVERY NIGHT AS NEEDED FOR MUSCLE SPASMS, Disp: 30 tablet, Rfl: 2 .  tobramycin-dexamethasone (TOBRADEX) ophthalmic solution, Place 1 drop into the right eye 4 (four) times daily. Up to 5 days, Disp: 5 mL, Rfl: 0 .  triamcinolone cream (KENALOG) 0.1 %, Apply 1 application topically 2 (two) times daily. (Patient taking differently: Apply 1 application topically daily as needed (rash). ), Disp: 15 g, Rfl: 4   Family History: family history includes  Cancer - Colon in her father; Diabetes in her mother; Heart disease in her father and mother; Other in her brother; Stroke in her mother.   Father colon cancer-age 61 Mother - diabetes, heart disease.    Social History:   reports that she has never smoked. She has never used smokeless tobacco. She reports that she does not drink alcohol or use drugs.     Review of Systems: Constitutional: Denies weight loss/weight gain  Eyes: No changes in vision. ENT: No oral lesions, sore throat.  GI: see HPI.  Heme/Lymph: No easy bruising.  CV: No chest pain.  GU: No hematuria.  Integumentary: No rashes.  Neuro: No headaches.  Psych: No depression/anxiety.  Endocrine: No heat/cold intolerance.  Allergic/Immunologic: No urticaria.  Resp: No cough, SOB.  Musculoskeletal: No joint swelling.    Physical Examination: Temp 99.2 F (37.3 C) Comment: per patient takes at night  Ht 5' 6.5" (1.689 m)   Wt 188 lb (85.3 kg)   BMI 29.89 kg/m  Gen: NAD, alert and oriented x 4 Virtual visit    Data Reviewed:  October 2020-TSH 7.27, free T4 normal, BMP normal except BUN creatinine ratio 40, ferritin 10, iFOBT positive.   Assessment/Plan: Ms. Batcheller is a 69 y.o. female    1. Positive fecal occult blood with ferritin of 10 in 08/2019, no recent CBC on file.  We will check a CBC and repeat her ferritin 3 months later, she is on iron supplement daily, reports needing IV iron in the past.  Would recommend endoscopy colonoscopy for further evaluation.  She has a history of gastric bypass and is using NSAIDs 3 times daily, did review increased risk for peptic ulcer disease which she has hx of, she prefers to continue current regimen for pain. She is on PPI without significant GERD sx.   2.  Family history of colon cancer-father, last colonoscopy 10 years ago, repeat EGD colonoscopy  3.  Chronic constipation-well-controlled with MiraLAX which she will continue.  She does report some chronic anal outlet  hemorrhoid bleeding unchanged over 15 years  Amanda Osborne was seen today for follow-up.  Diagnoses and all orders for this visit:  Iron deficiency anemia, unspecified iron deficiency anemia type -     CBC with Differential -     Fe+TIBC+Fer   EGD/Colonoscopy orders to be placed when able with Covid endoscopic restrictions.  Patient denies CP, SOB, and use of blood thinners. I discussed the risks and benefits of procedure including bleeding,  perforation, infection, missed lesions, medication reactions and possible hospitalization or surgery if complications. All questions answered.  Denies prior issues with sedation.    I personally performed the service, non-incident to. (WP)  Laurine Blazer, [facility]

## 2019-12-10 ENCOUNTER — Other Ambulatory Visit: Payer: Self-pay

## 2019-12-10 ENCOUNTER — Ambulatory Visit (INDEPENDENT_AMBULATORY_CARE_PROVIDER_SITE_OTHER): Payer: Medicare Other | Admitting: Family Medicine

## 2019-12-10 DIAGNOSIS — J019 Acute sinusitis, unspecified: Secondary | ICD-10-CM

## 2019-12-10 MED ORDER — AMOXICILLIN-POT CLAVULANATE 875-125 MG PO TABS: 1.0000 | ORAL_TABLET | Freq: Two times a day (BID) | ORAL | 0 refills | Status: DC

## 2019-12-10 NOTE — Progress Notes (Signed)
   Subjective:    Patient ID: Amanda Osborne, female    DOB: [birth year], 69 y.o.   MRN: [id number]  Sinusitis This is a new problem. Episode onset: 6 days. Associated symptoms include congestion and coughing. Pertinent negatives include no ear pain or shortness of breath. (Pain in teeth and right ear pain, eyes are crusty and has a film in the mornings) Treatments tried: loratadine, nasal spray, eye drops.  Relates a lot of head congestion drainage coughing no wheezing difficulty breathing Also has chronic pain but will be doing her follow-up visit in the near future Virtual Visit via Telephone Note  I connected with Amanda Osborne on 12/10/19 at  1:10 PM EST by telephone and verified that I am speaking with the correct person using two identifiers.  Location: Patient: home Provider: office   I discussed the limitations, risks, security and privacy concerns of performing an evaluation and management service by telephone and the availability of in person appointments. I also discussed with the patient that there may be a patient responsible charge related to this service. The patient expressed understanding and agreed to proceed.   History of Present Illness:    Observations/Objective:   Assessment and Plan:   Follow Up Instructions:    I discussed the assessment and treatment plan with the patient. The patient was provided an opportunity to ask questions and all were answered. The patient agreed with the plan and demonstrated an understanding of the instructions.   The patient was advised to call back or seek an in-person evaluation if the symptoms worsen or if the condition fails to improve as anticipated.  I provided 17 minutes of non-face-to-face time during this encounter.       Review of Systems  Constitutional: Negative for activity change and fever.  HENT: Positive for congestion and rhinorrhea. Negative for ear pain.   Eyes: Negative for discharge.    Respiratory: Positive for cough. Negative for shortness of breath and wheezing.   Cardiovascular: Negative for chest pain.       Objective:   Physical Exam   Today's visit was via telephone Physical exam was not possible for this visit  Covid testing discussed recommended patient will consider    Assessment & Plan:  Acute rhinosinusitis Antibiotics prescribed warning signs discussed Follow-up if progressive troubles or problems Keep regular visit coming up in the near future

## 2019-12-12 ENCOUNTER — Other Ambulatory Visit: Payer: Self-pay | Admitting: Family Medicine

## 2019-12-16 ENCOUNTER — Other Ambulatory Visit: Payer: Self-pay | Admitting: *Deleted

## 2019-12-16 MED ORDER — LEVOTHYROXINE SODIUM 25 MCG PO TABS: ORAL_TABLET | ORAL | 1 refills | Status: DC

## 2019-12-16 MED ORDER — FAMOTIDINE 20 MG PO TABS: 20.0000 mg | ORAL_TABLET | Freq: Two times a day (BID) | ORAL | 2 refills | Status: DC

## 2019-12-18 ENCOUNTER — Other Ambulatory Visit: Payer: Self-pay

## 2019-12-18 ENCOUNTER — Ambulatory Visit (INDEPENDENT_AMBULATORY_CARE_PROVIDER_SITE_OTHER): Payer: Medicare Other | Admitting: Family Medicine

## 2019-12-18 DIAGNOSIS — M4712 Other spondylosis with myelopathy, cervical region: Secondary | ICD-10-CM

## 2019-12-18 DIAGNOSIS — E038 Other specified hypothyroidism: Secondary | ICD-10-CM | POA: Diagnosis not present

## 2019-12-18 MED ORDER — OXYCODONE HCL 5 MG PO TABS: ORAL_TABLET | ORAL | 0 refills | Status: DC

## 2019-12-18 MED ORDER — OXYCODONE HCL 5 MG PO TABS: 5.0000 mg | ORAL_TABLET | ORAL | 0 refills | Status: DC | PRN

## 2019-12-18 MED ORDER — ALPRAZOLAM 0.5 MG PO TABS: ORAL_TABLET | ORAL | 5 refills | Status: DC

## 2019-12-18 NOTE — Progress Notes (Signed)
   Subjective:    Patient ID: Amanda Osborne, female    DOB: 03/12/1951, 69 y.o.   MRN: [id number]  HPI  This patient was seen today for chronic pain  The medication list was reviewed and updated.   -Compliance with medication: yes  - Number patient states they take daily: 6 a day  -when was the last dose patient took? today  The patient was advised the importance of maintaining medication and not using illegal substances with these.  Here for refills and follow up  The patient was educated that we can provide 3 monthly scripts for their medication, it is their responsibility to follow the instructions.  Side effects or complications from medications: none  Patient is aware that pain medications are meant to minimize the severity of the pain to allow their pain levels to improve to allow for better function. They are aware of that pain medications cannot totally remove their pain.  Patient states she has not been feeling well lately -trying to get over sinus infection. Patient has had a prolonged sinus infection head congestion drainage and coughing. Patient did do Covid testing toward the front of December She states she will finish out the Augmentin she has and if she still having trouble she will let us know to get an additional round  Review of Systems Virtual Visit via Video Note  I connected with NAYALEE RICKLEY on 12/18/19 at  1:40 PM EST by a video enabled telemedicine application and verified that I am speaking with the correct person using two identifiers.  Location: Patient: home Provider: office   I discussed the limitations of evaluation and management by telemedicine and the availability of in person appointments. The patient expressed understanding and agreed to proceed.  History of Present Illness:    Observations/Objective:   Assessment and Plan:   Follow Up Instructions:    I discussed the assessment and treatment plan with the patient. The  patient was provided an opportunity to ask questions and all were answered. The patient agreed with the plan and demonstrated an understanding of the instructions.   The patient was advised to call back or seek an in-person evaluation if the symptoms worsen or if the condition fails to improve as anticipated.  I provided 16 minutes of non-face-to-face time during this encounter.        Objective:   Physical Exam  Today's visit was via telephone Physical exam was not possible for this visit       Assessment & Plan:  1. Spondylosis, cervical, with myelopathy C5-6 She uses her pain medicine on a regular basis denies abusing it She states without her pain medicine she did have a hard time functioning.  2. Other specified hypothyroidism Takes her thyroid medicine regular basis  The patient was seen in followup for chronic pain. A review over at their current pain status was discussed. Drug registry was checked. Prescriptions were given. Discussion was held regarding the importance of compliance with medication as well as pain medication contract.  Time for questions regarding pain management plan occurred. Importance of regular followup visits was discussed. Patient was informed that medication may cause drowsiness and should not be combined  with other medications/alcohol or street drugs. Patient was cautioned that medication could cause drowsiness. If the patient feels medication is causing altered alertness then do not drive or operate dangerous equipment.

## 2019-12-19 ENCOUNTER — Encounter: Payer: Self-pay | Admitting: Family Medicine

## 2019-12-21 ENCOUNTER — Encounter: Payer: Self-pay | Admitting: Family Medicine

## 2019-12-23 ENCOUNTER — Other Ambulatory Visit: Payer: Self-pay | Admitting: Family Medicine

## 2019-12-23 MED ORDER — AMOXICILLIN-POT CLAVULANATE 875-125 MG PO TABS: 1.0000 | ORAL_TABLET | Freq: Two times a day (BID) | ORAL | 0 refills | Status: DC

## 2019-12-23 NOTE — Telephone Encounter (Signed)
See other mychart message from today.

## 2019-12-23 NOTE — Telephone Encounter (Signed)
Called and discussed with pt and she verbalized understanding. She states she will call back for appt if this antibiotic is not helping or if she is worse.

## 2019-12-23 NOTE — Telephone Encounter (Signed)
Nurses Please connect with patient I sent in a refill of her Augmentin this morning she should be able to pick it up at [facility] on [address] My apologies her message on Friday was overlooked by myself and that is why the delay If she is having significant troubles we can easily work her in or see her will be today or any other day at her discretion otherwise she should take the antibiotic twice daily for the next 10 days Have patient call us if any ongoing issues or problems thank

## 2019-12-26 ENCOUNTER — Encounter (INDEPENDENT_AMBULATORY_CARE_PROVIDER_SITE_OTHER): Payer: Self-pay | Admitting: *Deleted

## 2019-12-31 ENCOUNTER — Telehealth (INDEPENDENT_AMBULATORY_CARE_PROVIDER_SITE_OTHER): Payer: Self-pay | Admitting: *Deleted

## 2019-12-31 NOTE — Telephone Encounter (Signed)
I have made several attempts to contact patient to schedule TCS/EGD, her VM is full and I cannot leave a message. I have sent her a MyChart message asking her to call me to schedule. I have also mailed the letter to her

## 2020-01-02 ENCOUNTER — Other Ambulatory Visit: Payer: Self-pay

## 2020-01-02 ENCOUNTER — Ambulatory Visit (INDEPENDENT_AMBULATORY_CARE_PROVIDER_SITE_OTHER): Payer: Medicare Other | Admitting: Family Medicine

## 2020-01-02 DIAGNOSIS — J019 Acute sinusitis, unspecified: Secondary | ICD-10-CM | POA: Diagnosis not present

## 2020-01-02 MED ORDER — AMOXICILLIN-POT CLAVULANATE 875-125 MG PO TABS: 1.0000 | ORAL_TABLET | Freq: Two times a day (BID) | ORAL | 0 refills | Status: DC

## 2020-01-02 NOTE — Progress Notes (Signed)
   Subjective:    Patient ID: Amanda Osborne, female    DOB: [birth year], 69 y.o.   MRN: [id number]  Cough Episode onset: beginning of december. Associated symptoms include a fever and rhinorrhea. Pertinent negatives include no chest pain, ear pain, shortness of breath or wheezing. Associated symptoms comments: Congestion, temp 99.6 at night. Treatments tried: 2 antibiotics. took last dose of augmentin yesterday.  Patient with ongoing sinus pressure pain discomfort denies wheezing difficulty breathing relates a lot of coughing congestion drainage.  Intermittent low-grade fevers.  PMH benign Virtual Visit via Telephone Note  I connected with LAMESHIA PREISTER on 01/02/20 at  2:00 PM EST by telephone and verified that I am speaking with the correct person using two identifiers.  Location: Patient: home Provider: office   I discussed the limitations, risks, security and privacy concerns of performing an evaluation and management service by telephone and the availability of in person appointments. I also discussed with the patient that there may be a patient responsible charge related to this service. The patient expressed understanding and agreed to proceed.   History of Present Illness:    Observations/Objective:   Assessment and Plan:   Follow Up Instructions:    I discussed the assessment and treatment plan with the patient. The patient was provided an opportunity to ask questions and all were answered. The patient agreed with the plan and demonstrated an understanding of the instructions.   The patient was advised to call back or seek an in-person evaluation if the symptoms worsen or if the condition fails to improve as anticipated.  I provided 20 minutes of non-face-to-face time during this encounter.      Review of Systems  Constitutional: Positive for fever. Negative for activity change.  HENT: Positive for congestion and rhinorrhea. Negative for ear pain.   Eyes:  Negative for discharge.  Respiratory: Positive for cough. Negative for shortness of breath and wheezing.   Cardiovascular: Negative for chest pain.       Objective:     Virtual visit physical exam not possible     Assessment & Plan:  Acute rhinosinusitis antibiotics prescribed warning signs discussed follow-up if progressive troubles or problems.  Patient would need to do in person visit if ongoing troubles

## 2020-01-12 ENCOUNTER — Other Ambulatory Visit: Payer: Self-pay | Admitting: Family Medicine

## 2020-01-19 IMAGING — MG DIGITAL SCREENING BILATERAL MAMMOGRAM WITH TOMO AND CAD
8 series · 8 of 24 positions shown · non-contrast
Comparison: Previous exam(s).

CLINICAL DATA: Screening.

EXAM:
DIGITAL SCREENING BILATERAL MAMMOGRAM WITH TOMO AND CAD

[R CC synth-2D]
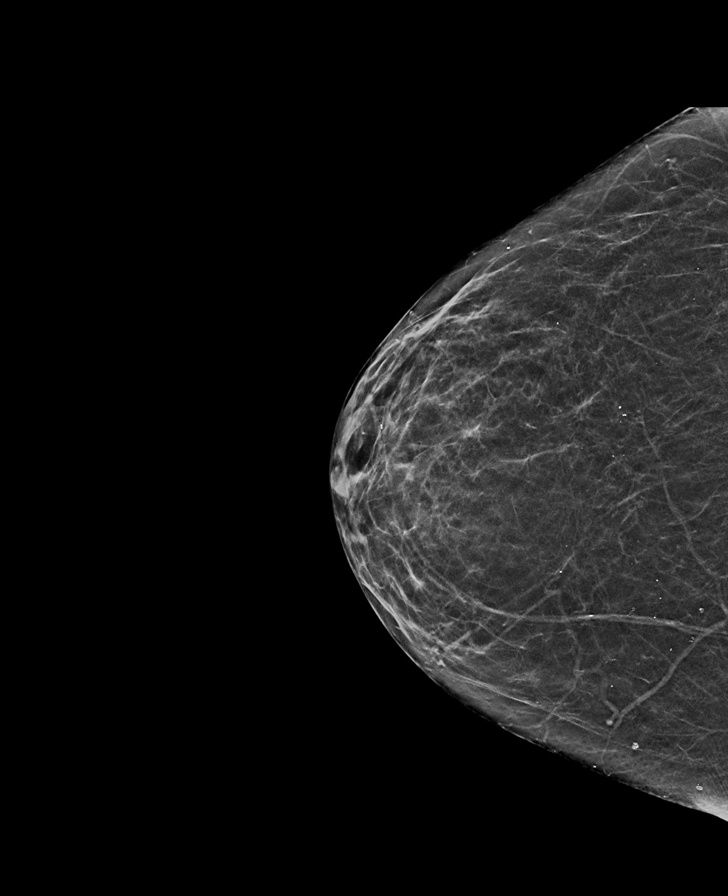

[L MLO synth-2D]
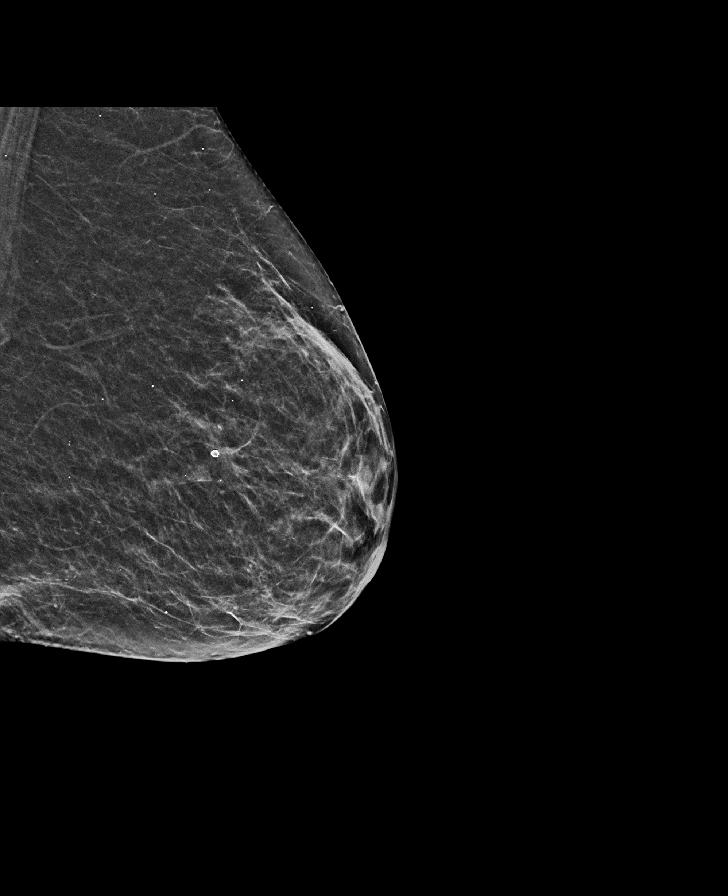

[L CC synth-2D]
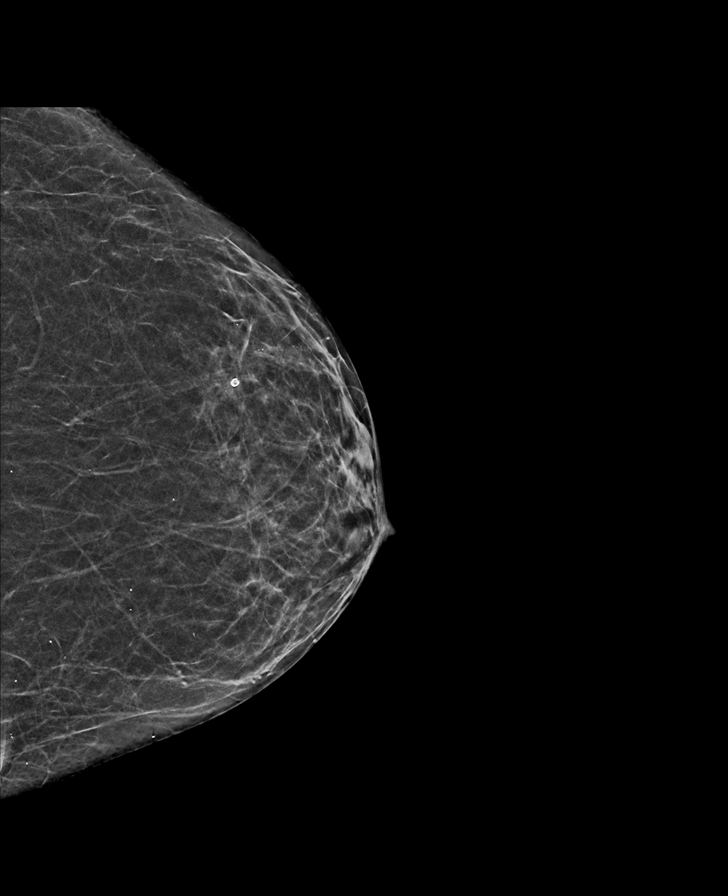

[R MLO synth-2D]
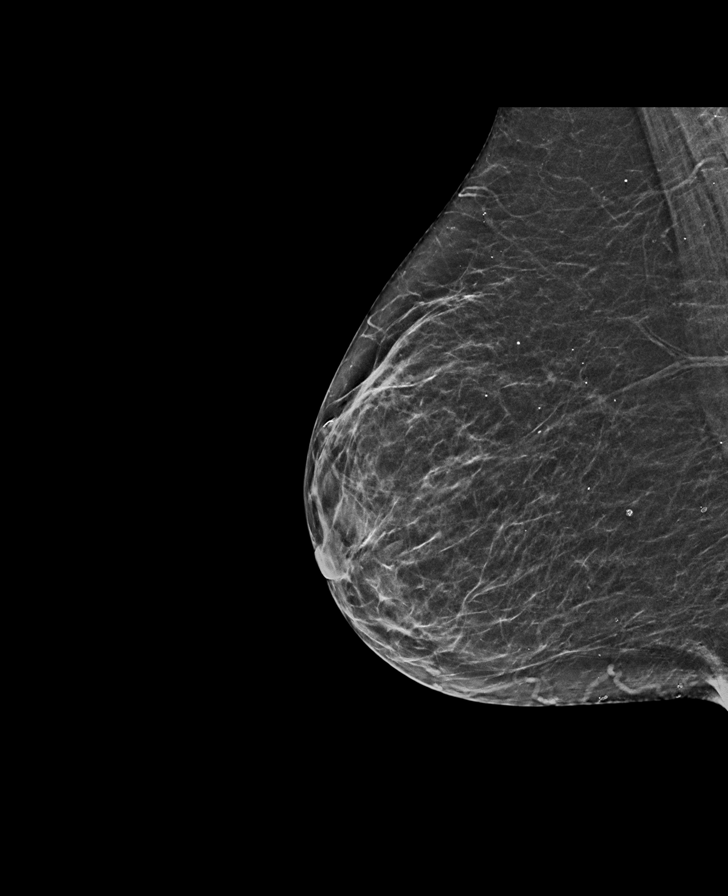

[L CC tomo · tomo slice 23/44.0]
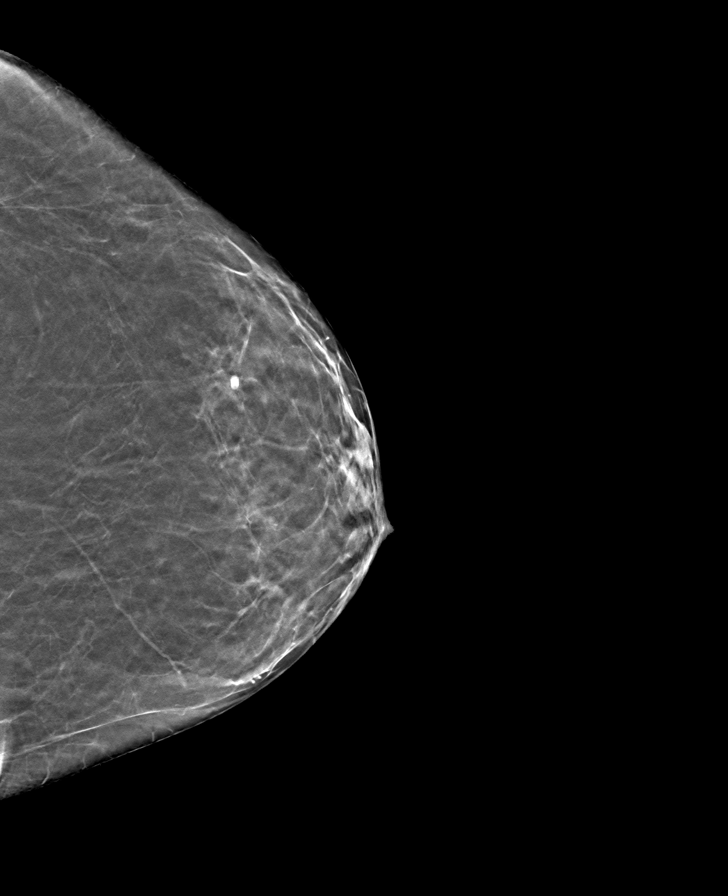

[R CC tomo · tomo slice 25/49.0]
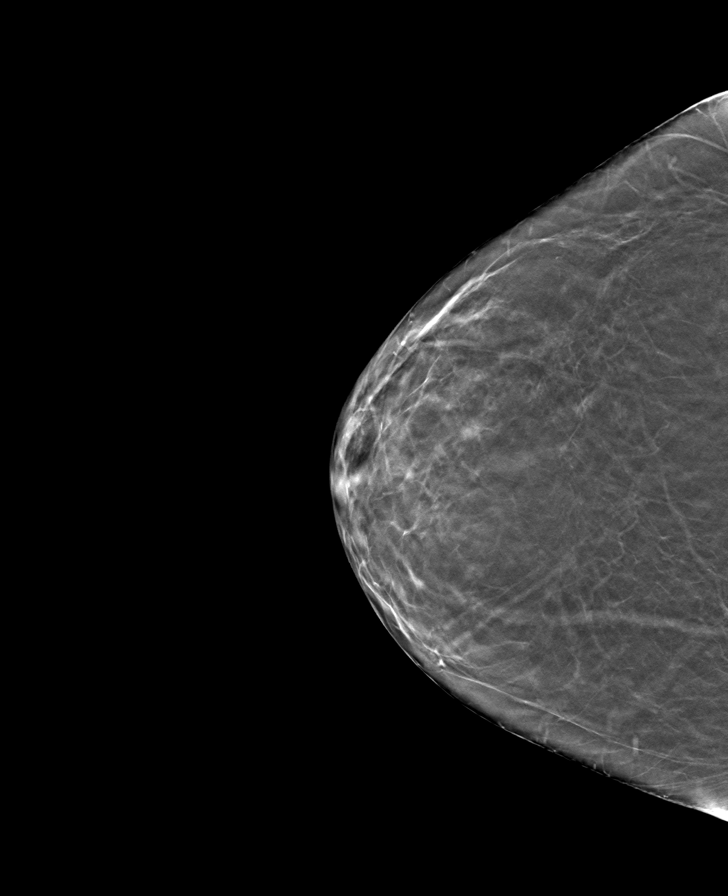

[L MLO tomo · tomo slice 27/52.0]
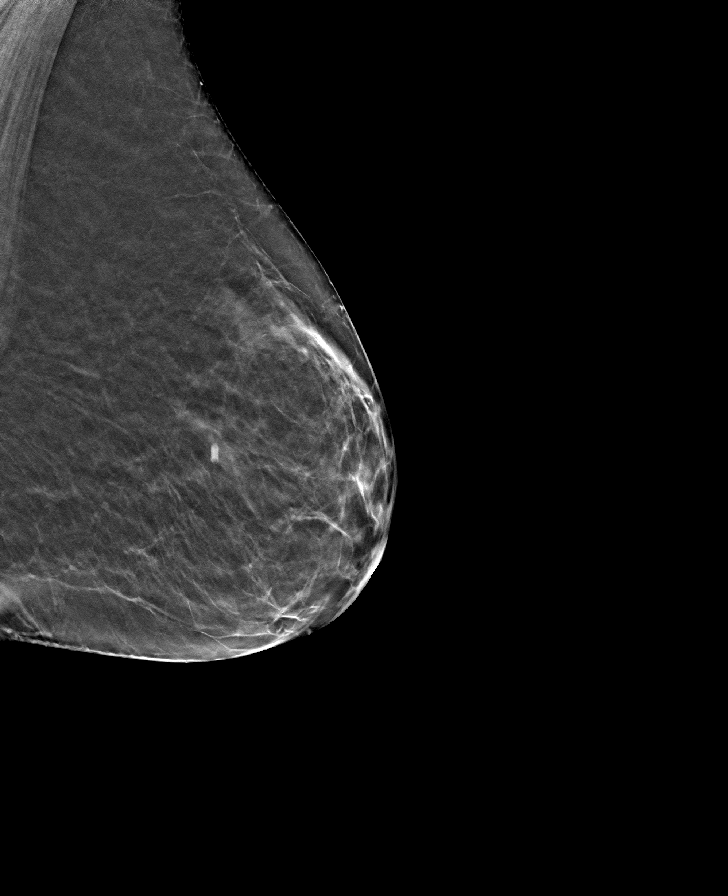

[R MLO tomo · tomo slice 28/55.0]
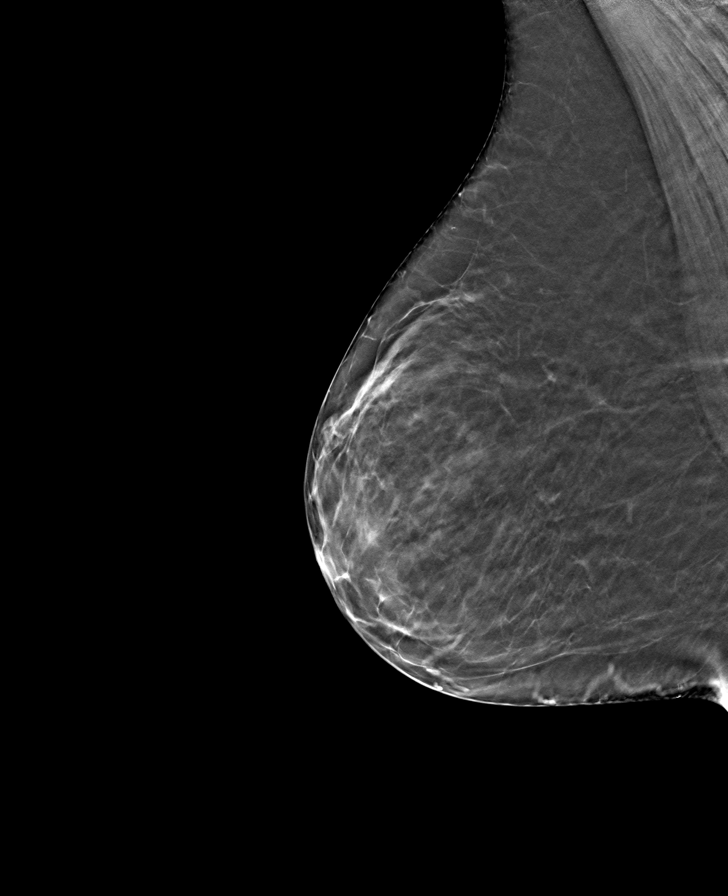

[8 of 24 positions shown; findings below may reference images not displayed]

ACR Breast Density Category b: There are scattered areas of
fibroglandular density.
FINDINGS: There are no findings suspicious for malignancy. Images were
processed with CAD.
IMPRESSION: No mammographic evidence of malignancy. A result letter of this
screening mammogram will be mailed directly to the patient.

RECOMMENDATION:
Screening mammogram in one year. (Code:[id number])

BI-RADS CATEGORY  1: Negative.

## 2020-01-31 ENCOUNTER — Encounter: Payer: Self-pay | Admitting: Family Medicine

## 2020-01-31 ENCOUNTER — Ambulatory Visit (INDEPENDENT_AMBULATORY_CARE_PROVIDER_SITE_OTHER): Payer: Medicare Other | Admitting: Family Medicine

## 2020-01-31 DIAGNOSIS — D509 Iron deficiency anemia, unspecified: Secondary | ICD-10-CM | POA: Diagnosis not present

## 2020-01-31 DIAGNOSIS — E7849 Other hyperlipidemia: Secondary | ICD-10-CM

## 2020-01-31 DIAGNOSIS — I10 Essential (primary) hypertension: Secondary | ICD-10-CM

## 2020-01-31 DIAGNOSIS — E038 Other specified hypothyroidism: Secondary | ICD-10-CM

## 2020-01-31 MED ORDER — LEVOTHYROXINE SODIUM 25 MCG PO TABS: ORAL_TABLET | ORAL | 1 refills | Status: DC

## 2020-01-31 MED ORDER — LISINOPRIL 2.5 MG PO TABS: 2.5000 mg | ORAL_TABLET | Freq: Every day | ORAL | 5 refills | Status: DC

## 2020-01-31 NOTE — Progress Notes (Signed)
   Subjective:    Patient ID: Amanda Osborne, female    DOB: 02/18/1951, 69 y.o.   MRN: [id number]  HPI  Patient wanted to discuss some prescriptions. Patient states she is taking a half of lisinopril and sent you a picture to show you how small they are and difficult it is.  She was on lisinopril 2.5 mg daily unable to cut her 5 mg in half effectively she is requesting that we prescribe the 2.5 or go back to the 5 mg   Patient states she is also still having sinus problems.  Patient relates repetitive problems with sinuses on antibiotics and still having sinus pressure and she is discovered black mold in her apartment she is try to get this taken care of  nortryptyline she is followed by specialist for this she is requesting that we send in the prescription know when she runs out because she feels like she can just follow-up with [place] currently  hypothyroidism she does stated she is taking her medicines on a regular basis denies any major setbacks  Virtual Visit via Video Note  I connected with [name] on 01/31/20 at  3:00 PM EST by a video enabled telemedicine application and verified that I am speaking with the correct person using two identifiers.  Location: Patient: home Provider: office   I discussed the limitations of evaluation and management by telemedicine and the availability of in person appointments. The patient expressed understanding and agreed to proceed.  History of Present Illness:    Observations/Objective:   Assessment and Plan:   Follow Up Instructions:    I discussed the assessment and treatment plan with the patient. The patient was provided an opportunity to ask questions and all were answered. The patient agreed with the plan and demonstrated an understanding of the instructions.   The patient was advised to call back or seek an in-person evaluation if the symptoms worsen or if the condition fails to improve as anticipated.  I provided 22  minutes of non-face-to-face time during this encounter.  Between discussing her case and reviewing her chart and documenting   Review of Systems Denies chest tightness pressure pain shortness of breath please see above    Objective:   Physical Exam  Virtual visit unable to do exam      Assessment & Plan:  1. HTN (hypertension), benign Blood pressure decent control check lab work before next visit has visit in April Patient unable to split the 5 mg tablet therefore we will send in the 2.5 mg tablet continue this - Hepatic function panel - Basic metabolic panel  2. Other specified hypothyroidism Thyroid will need to be checked continue current medication - TSH  3. Iron deficiency anemia, unspecified iron deficiency anemia type Patient with iron deficient anemia check lab work before next visit - CBC with Differential/Platelet - Ferritin  4. Other hyperlipidemia Hyperlipidemia check lab work before next visit - Lipid panel  Patient in the future will get [place] to send in the nortriptyline I told her when she is ready to notify us  Her sinus symptoms probably related to mold she is having her apartment manager address this

## 2020-02-12 ENCOUNTER — Encounter: Payer: Self-pay | Admitting: Family Medicine

## 2020-02-12 NOTE — Telephone Encounter (Signed)
Called and discussed with pt. She states she explained to dr scott why it was so hard for her to take the levothyroxine and he told her she could take all 7 tablets in one day. She said this is how she has been taking it since she started back on it. She then said make sure this is what dr Nicki Reaper wanted her to do. This is how rx was sent in. Pt has enough for tomorrow. I told her we would clarifywith dr Nicki Reaper and call her back tomorrow and I would  go ahead and send in the fanny cream and I called it in to [facility].

## 2020-02-12 NOTE — Telephone Encounter (Signed)
Left message to return call 

## 2020-02-12 NOTE — Telephone Encounter (Signed)
Nurses It is perfectly fine to take all thyroid medicine on 1 day She can get 28 tablets with 6 refills or  84 tablets with a refill Please send into the pharmacy and notify the patient thanks

## 2020-02-12 NOTE — Telephone Encounter (Signed)
Nurses Obviously there is a Public affairs consultant somewhere Refill was sent back on March 12 Please verify dosing go ahead and send him 28-month supply with 1 refill to the requested pharmacy  Please send in prescription for Fanny cream may have a tub-what ever large portion [facility] is willing to prescribe/fill apply to affected areas 4 times daily as needed with 1 year refill  Please send patient notification once this is completed

## 2020-02-13 ENCOUNTER — Other Ambulatory Visit: Payer: Self-pay | Admitting: *Deleted

## 2020-02-13 MED ORDER — PRAVASTATIN SODIUM 20 MG PO TABS: ORAL_TABLET | ORAL | 5 refills | Status: DC

## 2020-02-13 MED ORDER — LEVOTHYROXINE SODIUM 25 MCG PO TABS: ORAL_TABLET | ORAL | 1 refills | Status: DC

## 2020-02-13 NOTE — Addendum Note (Signed)
Addended by: Dairl Ponder on: 02/13/2020 02:14 PM   Modules accepted: Orders

## 2020-02-13 NOTE — Telephone Encounter (Signed)
Prescription sent electronically to pharmacy. Patient notified. 

## 2020-02-14 ENCOUNTER — Ambulatory Visit: Payer: Medicare Other | Attending: Internal Medicine

## 2020-02-14 DIAGNOSIS — Z23 Encounter for immunization: Secondary | ICD-10-CM

## 2020-02-14 NOTE — Progress Notes (Signed)
   Covid-19 Vaccination Clinic  Name:  Amanda Osborne    MRN: [id number] DOB: Dec 17, 1950  02/14/2020  [name] was observed post Covid-19 immunization for 15 minutes without incident. She was provided with Vaccine Information Sheet and instruction to access the V-Safe system.   [name] was instructed to call 911 with any severe reactions post vaccine: Marland Kitchen Difficulty breathing  . Swelling of face and throat  . A fast heartbeat  . A bad rash all over body  . Dizziness and weakness   Immunizations Administered    Name Date Dose VIS Date Route   Moderna COVID-19 Vaccine 02/14/2020  1:06 PM 0.5 mL 10/22/2019 Intramuscular   Manufacturer: Moderna   Lot: [id number]   [id number]

## 2020-03-13 ENCOUNTER — Telehealth: Payer: Self-pay | Admitting: Family Medicine

## 2020-03-13 NOTE — Telephone Encounter (Signed)
Appt Monday for check up. Has been working out in yard and having cough and congestion, itchy eyes. States she knows this is allergies and she has poison oak. No fever. States to just call back and leave a message on her voicemail either way

## 2020-03-13 NOTE — Telephone Encounter (Signed)
Pt is having cough, congestion. No fever. She has been working in the yard and thinks it is just allergies and wants to come to office for appt on Monday.

## 2020-03-14 NOTE — Telephone Encounter (Signed)
May keep her appointment

## 2020-03-16 ENCOUNTER — Encounter: Payer: Self-pay | Admitting: Family Medicine

## 2020-03-16 ENCOUNTER — Ambulatory Visit (INDEPENDENT_AMBULATORY_CARE_PROVIDER_SITE_OTHER): Payer: Medicare Other | Admitting: Family Medicine

## 2020-03-16 ENCOUNTER — Other Ambulatory Visit: Payer: Self-pay

## 2020-03-16 VITALS — BP 134/66 | Temp 95.4°F | Wt 189.4 lb

## 2020-03-16 DIAGNOSIS — F411 Generalized anxiety disorder: Secondary | ICD-10-CM

## 2020-03-16 DIAGNOSIS — R102 Pelvic and perineal pain: Secondary | ICD-10-CM

## 2020-03-16 DIAGNOSIS — D509 Iron deficiency anemia, unspecified: Secondary | ICD-10-CM | POA: Diagnosis not present

## 2020-03-16 DIAGNOSIS — E038 Other specified hypothyroidism: Secondary | ICD-10-CM | POA: Diagnosis not present

## 2020-03-16 DIAGNOSIS — I10 Essential (primary) hypertension: Secondary | ICD-10-CM | POA: Diagnosis not present

## 2020-03-16 DIAGNOSIS — E7849 Other hyperlipidemia: Secondary | ICD-10-CM

## 2020-03-16 MED ORDER — OXYCODONE HCL 5 MG PO TABS: 5.0000 mg | ORAL_TABLET | ORAL | 0 refills | Status: DC | PRN

## 2020-03-16 MED ORDER — OXYCODONE HCL 5 MG PO TABS: ORAL_TABLET | ORAL | 0 refills | Status: DC

## 2020-03-16 MED ORDER — ALPRAZOLAM 0.5 MG PO TABS: ORAL_TABLET | ORAL | 5 refills | Status: DC

## 2020-03-16 NOTE — Telephone Encounter (Signed)
Pt notified she can keep her appt for today at 1:10

## 2020-03-16 NOTE — Progress Notes (Addendum)
Subjective:    Patient ID: Amanda Osborne, female    DOB: 03/31/1951, 69 y.o.   MRN: [id number]  HPI This patient was seen today for chronic pain  The medication list was reviewed and updated.   -Compliance with medication: oxycodone 5 mg  - Number patient states they take daily: 6  -when was the last dose patient took? Noon today   The patient was advised the importance of maintaining medication and not using illegal substances with these.  Here for refills and follow up  The patient was educated that we can provide 3 monthly scripts for their medication, it is their responsibility to follow the instructions.  Side effects or complications from medications: none  Patient is aware that pain medications are meant to minimize the severity of the pain to allow their pain levels to improve to allow for better function. They are aware of that pain medications cannot totally remove their pain. The patient states that the pain medication allows her to function better helps keep the neck pain in check so that she can get through the day Due for UDT ( at least once per year) : attempted at beginning of visit, pt missed urine cup. Will try at end of visit Patient would do her urine drug screen on next visit Patient will be doing her lab work we will follow up on this over the next 24 to 48 hours Felt bad this past Friday Worked outside [place] worn out Had similar issues back in [place] nauseated feltbetter after burping  Some fluttering of the heart She denies substernal chest tightness pressure pain More than likely this was just over exertion and overactivity and should gradually get better Fall Risk  08/12/2019 12/25/2018 12/30/2016 10/20/2015 03/17/2014  Falls in the past year? 0 0 No No No  Number falls in past yr: 0 0 - - -  Injury with Fall? 0 0 - - -  Follow up Falls evaluation completed Falls evaluation completed - - -     Review of Systems  Constitutional: Negative for  activity change and appetite change.  HENT: Negative for congestion and rhinorrhea.   Respiratory: Negative for cough and shortness of breath.   Cardiovascular: Negative for chest pain and leg swelling.  Gastrointestinal: Negative for abdominal pain, nausea and vomiting.  Skin: Negative for color change.  Neurological: Negative for dizziness and weakness.  Psychiatric/Behavioral: Negative for agitation and confusion.       Objective:   Physical Exam Vitals reviewed.  Constitutional:      General: She is not in acute distress. HENT:     Head: Normocephalic.  Cardiovascular:     Rate and Rhythm: Normal rate and regular rhythm.     Heart sounds: Normal heart sounds. No murmur.  Pulmonary:     Effort: Pulmonary effort is normal.     Breath sounds: Normal breath sounds.  Lymphadenopathy:     Cervical: No cervical adenopathy.  Neurological:     Mental Status: She is alert.  Psychiatric:        Behavior: Behavior normal.   Blood pressure checked several times in good with standing  30 minutes spent with patient multiple issues addressed referral for pelvic pressure continuation of pain medications documentation and time with patient      Assessment & Plan:  1. Other specified hypothyroidism Continue take medication.  Lab work pending She will do the lab work today   2. HTN (hypertension), benign Blood pressure good control  with medications watch diet minimize salt  3. Generalized anxiety disorder May continue the Xanax but it is important not to overuse it and if feeling drowsy do not drive patient understands ideally we would not be on Xanax but she has been on this for years and states it is necessary for her severe underlying anxiety she understands 30 if she is ever feeling drowsy with the medicine not to take the oxycodone  4. Other hyperlipidemia Continue to take the cholesterol medicine watch diet closely stay physically active await lab work  5. Pelvic pressure in  female Patient relates intermittent pelvic pressure which seems to be getting worse she is interested in seeing gynecology for referral I think that is reasonable more than likely they will do an ultrasound - Ambulatory referral to Obstetrics / Gynecology  The patient was seen in followup for chronic pain. A review over at their current pain status was discussed. Drug registry was checked. Prescriptions were given.  Regular follow-up recommended. Discussion was held regarding the importance of compliance with medication as well as pain medication contract.  Patient was informed that medication may cause drowsiness and should not be combined  with other medications/alcohol or street drugs. If the patient feels medication is causing altered alertness then do not drive or operate dangerous equipment.  Drug registry checked 3 prescription sent in

## 2020-03-17 ENCOUNTER — Other Ambulatory Visit: Payer: Self-pay | Admitting: Family Medicine

## 2020-03-17 LAB — BASIC METABOLIC PANEL
BUN/Creatinine Ratio: 28 (ref 12–28)
BUN: 17 mg/dL (ref 8–27)
CO2: 23 mmol/L (ref 20–29)
Calcium: 9.7 mg/dL (ref 8.7–10.3)
Chloride: 98 mmol/L (ref 96–106)
Creatinine, Ser: 0.61 mg/dL (ref 0.57–1.00)
GFR calc Af Amer: 107 mL/min/{1.73_m2} (ref >59)
GFR calc non Af Amer: 93 mL/min/{1.73_m2} (ref >59)
Glucose: 93 mg/dL (ref 65–99)
Potassium: 3.8 mmol/L (ref 3.5–5.2)
Sodium: 137 mmol/L (ref 134–144)

## 2020-03-17 LAB — CBC WITH DIFFERENTIAL/PLATELET
Basophils Absolute: 0 10*3/uL (ref 0.0–0.2)
Basos: 1 %
EOS (ABSOLUTE): 0.3 10*3/uL (ref 0.0–0.4)
Eos: 6 %
Hematocrit: 38.1 % (ref 34.0–46.6)
Hemoglobin: 12.1 g/dL (ref 11.1–15.9)
Immature Grans (Abs): 0 10*3/uL (ref 0.0–0.1)
Immature Granulocytes: 0 %
Lymphocytes Absolute: 1.2 10*3/uL (ref 0.7–3.1)
Lymphs: 25 %
MCH: 24.2 pg — ABNORMAL LOW (ref 26.6–33.0)
MCHC: 31.8 g/dL (ref 31.5–35.7)
MCV: 76 fL — ABNORMAL LOW (ref 79–97)
Monocytes Absolute: 0.4 10*3/uL (ref 0.1–0.9)
Monocytes: 8 %
Neutrophils Absolute: 2.9 10*3/uL (ref 1.4–7.0)
Neutrophils: 60 %
Platelets: 439 10*3/uL (ref 150–450)
RBC: 4.99 x10E6/uL (ref 3.77–5.28)
RDW: 14.2 % (ref 11.7–15.4)
WBC: 4.9 10*3/uL (ref 3.4–10.8)

## 2020-03-17 LAB — LIPID PANEL
Chol/HDL Ratio: 3.5 ratio (ref 0.0–4.4)
Cholesterol, Total: 205 mg/dL — ABNORMAL HIGH (ref 100–199)
HDL: 58 mg/dL (ref >39)
LDL Chol Calc (NIH): 130 mg/dL — ABNORMAL HIGH (ref 0–99)
Triglycerides: 94 mg/dL (ref 0–149)
VLDL Cholesterol Cal: 17 mg/dL (ref 5–40)

## 2020-03-17 LAB — HEPATIC FUNCTION PANEL
ALT: 16 IU/L (ref 0–32)
AST: 24 IU/L (ref 0–40)
Albumin: 4.4 g/dL (ref 3.8–4.8)
Alkaline Phosphatase: 83 IU/L (ref 39–117)
Bilirubin Total: <0.2 mg/dL (ref 0.0–1.2)
Bilirubin, Direct: 0.05 mg/dL (ref 0.00–0.40)
Total Protein: 6.7 g/dL (ref 6.0–8.5)

## 2020-03-17 LAB — TSH: TSH: 2.2 u[IU]/mL (ref 0.450–4.500)

## 2020-03-17 LAB — FERRITIN: Ferritin: 10 ng/mL — ABNORMAL LOW (ref 15–150)

## 2020-03-18 ENCOUNTER — Ambulatory Visit: Payer: Medicare Other | Attending: Internal Medicine

## 2020-03-18 ENCOUNTER — Other Ambulatory Visit: Payer: Self-pay

## 2020-03-18 DIAGNOSIS — Z23 Encounter for immunization: Secondary | ICD-10-CM

## 2020-03-18 NOTE — Progress Notes (Signed)
   Covid-19 Vaccination Clinic  Name:  Amanda Osborne    MRN: [id number] DOB: Nov 25, 1950  03/18/2020  Amanda Osborne was observed post Covid-19 immunization for 15 minutes without incident. She was provided with Vaccine Information Sheet and instruction to access the V-Safe system.   Amanda Osborne was instructed to call 911 with any severe reactions post vaccine: Marland Kitchen Difficulty breathing  . Swelling of face and throat  . A fast heartbeat  . A bad rash all over body  . Dizziness and weakness   Immunizations Administered    Name Date Dose VIS Date Route   Moderna COVID-19 Vaccine 03/18/2020 10:33 AM 0.5 mL 10/2019 Intramuscular   Manufacturer: Moderna   Lot: [id number]   [place]BE:[id number]

## 2020-03-19 ENCOUNTER — Encounter (INDEPENDENT_AMBULATORY_CARE_PROVIDER_SITE_OTHER): Payer: Self-pay | Admitting: *Deleted

## 2020-03-19 NOTE — Telephone Encounter (Signed)
This encounter was created in error - please disregard.

## 2020-03-24 ENCOUNTER — Encounter: Payer: Self-pay | Admitting: Family Medicine

## 2020-03-24 ENCOUNTER — Other Ambulatory Visit: Payer: Self-pay | Admitting: Neurology

## 2020-03-25 ENCOUNTER — Other Ambulatory Visit: Payer: Self-pay

## 2020-03-25 MED ORDER — NORTRIPTYLINE HCL 10 MG PO CAPS: ORAL_CAPSULE | ORAL | 0 refills | Status: DC

## 2020-04-09 ENCOUNTER — Encounter: Payer: Medicare Other | Admitting: Obstetrics & Gynecology

## 2020-04-09 ENCOUNTER — Ambulatory Visit: Payer: Medicare Other | Admitting: Obstetrics & Gynecology

## 2020-04-09 ENCOUNTER — Encounter: Payer: Self-pay | Admitting: Obstetrics & Gynecology

## 2020-04-09 ENCOUNTER — Telehealth: Payer: Self-pay | Admitting: Family Medicine

## 2020-04-09 VITALS — BP 131/78 | HR 95 | Wt 185.0 lb

## 2020-04-09 DIAGNOSIS — R102 Pelvic and perineal pain: Secondary | ICD-10-CM | POA: Diagnosis not present

## 2020-04-09 DIAGNOSIS — N814 Uterovaginal prolapse, unspecified: Secondary | ICD-10-CM | POA: Diagnosis not present

## 2020-04-09 NOTE — Telephone Encounter (Signed)
Immunization have been put into chart.

## 2020-04-09 NOTE — Progress Notes (Signed)
Chief Complaint  Patient presents with  . vaginal pressure    referred by Dr Wolfgang Phoenix      69 y.o. No obstetric history on file. No LMP recorded. Patient is postmenopausal. The current method of family planning is menopause.  Outpatient Encounter Medications as of 04/09/2020  Medication Sig Note  . ALPRAZolam (XANAX) 0.5 MG tablet TAKE 1 TABLET BY MOUTH TWICE DAILY AS NEEDED FOR ANXIETY   . esomeprazole (NEXIUM) 20 MG capsule Take 20 mg by mouth daily.    . famotidine (PEPCID) 20 MG tablet TAKE 1 TABLET(20 MG) BY MOUTH TWICE DAILY   . fluticasone (FLONASE) 50 MCG/ACT nasal spray INSTILL 2 SPRAYS INTO EACH NOSTRIL ONCE DAILY 12/04/2019: As needed.  . hydrochlorothiazide (HYDRODIURIL) 25 MG tablet TAKE 1 TABLET(25 MG) BY MOUTH DAILY   . levothyroxine (SYNTHROID) 25 MCG tablet Take 7 tablets po each Thursday   . lisinopril (ZESTRIL) 2.5 MG tablet Take 1 tablet (2.5 mg total) by mouth daily.   Marland Kitchen loratadine (CLARITIN) 10 MG tablet Take 10 mg by mouth daily as needed for allergies.  12/04/2019: Currently taking daily.  . nortriptyline (PAMELOR) 10 MG capsule Take one capsule at night for 2 weeks, then take 2 capsules at night   . oxyCODONE (OXY IR/ROXICODONE) 5 MG immediate release tablet Take 1 tablet (5 mg total) by mouth every 4 (four) hours as needed for severe pain.   Marland Kitchen oxyCODONE (ROXICODONE) 5 MG immediate release tablet Take 1 tablet (5 mg total) by mouth every 4 (four) hours as needed for severe pain   . oxyCODONE (ROXICODONE) 5 MG immediate release tablet Take 1 tablet (5 mg total) by mouth every 4 (four) hours as needed for severe pain   . polyethylene glycol powder (GLYCOLAX/MIRALAX) powder Take 17 g by mouth daily.    . pravastatin (PRAVACHOL) 20 MG tablet TAKE 1 TABLET EVERY DAY ON MONDAY WEDNESDAYS AND FRIDAYS   . tiZANidine (ZANAFLEX) 4 MG tablet TAKE 1/2 TO 1 TABLET BY MOUTH EVERY NIGHT AS NEEDED FOR MUSCLE SPASMS   . tobramycin-dexamethasone (TOBRADEX) ophthalmic solution  Place 1 drop into the right eye 4 (four) times daily. Up to 5 days 12/04/2019: Per patient  she uses as needed.  . triamcinolone cream (KENALOG) 0.1 % Apply 1 application topically 2 (two) times daily. (Patient taking differently: Apply 1 application topically daily as needed (rash). )    No facility-administered encounter medications on file as of 04/09/2020.    Subjective Pt with feeling of episodic pelvic pressure started 3 years ago It is a pressure sensation, pulling However over the last 6 months has been constant and much worse No bleeding No incontinence No constipation No other associated complaints Past Medical History:  Diagnosis Date  . Anxiety   . Arthritis   . Cervical disc herniation    c5 and c6  . Cervicogenic headache 05/13/2019  . Chronic pain   . Complication of anesthesia   . Fibromyalgia   . GERD (gastroesophageal reflux disease)   . Headache(784.0)    Hx: of migraines until age 64's  . Hyperlipidemia   . Hypertension   . Iron deficiency anemia 11/30/2012  . Left carpal tunnel syndrome 11/07/2018  . Narcolepsy   . Occipital neuralgia   . PONV (postoperative nausea and vomiting)    DURING FIRST NECK SURGERY ; BUT REPORT IT MAY HAVE BEEN THE DILAUDID THAT CAUSED THE VOITING I  . Unspecified vitamin D deficiency 03/26/2013  . Wrist fracture, bilateral  2005 and 2010   right then left    Past Surgical History:  Procedure Laterality Date  . ANTERIOR CERVICAL DECOMP/DISCECTOMY FUSION  2000   C6-C7 DR Ellene Route   . ANTERIOR CERVICAL DECOMP/DISCECTOMY FUSION N/A 08/05/2013   Procedure: Cervical five-six Anterior cervical decompression/diskectomy/fusion/ Synthes plate removal;  Surgeon: Kristeen Miss, MD;  Location: [facility];  Service: Neurosurgery;  Laterality: N/A;  . BONE GRAFT HIP ILIAC CREST  2006   to right wrist to correct fracture, DR. [facility] [facility]   . COLONOSCOPY     Hx; of  . DILATION AND CURETTAGE OF UTERUS    . ESOPHAGEAL DILATION N/A 10/22/2015     Procedure: ESOPHAGEAL DILATION;  Surgeon: Rogene Houston, MD;  Location: [facility];  Service: Endoscopy;  Laterality: N/A;  . ESOPHAGOGASTRODUODENOSCOPY N/A 08/28/2015   Procedure: ESOPHAGOGASTRODUODENOSCOPY (EGD);  Surgeon: Rogene Houston, MD;  Location: [facility];  Service: Endoscopy;  Laterality: N/A;  910am  . ESOPHAGOGASTRODUODENOSCOPY N/A 10/22/2015   Procedure: ESOPHAGOGASTRODUODENOSCOPY (EGD);  Surgeon: Rogene Houston, MD;  Location: [facility];  Service: Endoscopy;  Laterality: N/A;  12:00  . FRACTURE SURGERY     Hx: of right wrist surgery  . GASTRIC BYPASS  1981  . INCISIONAL HERNIA REPAIR N/A 06/14/2018   Procedure: LAPAROSCOPIC INCISIONAL HERNIA REPAIR WITH INSERTION OF MESH ERAS PATHWAY;  Surgeon: Kinsinger, Arta Bruce, MD;  Location: [facility];  Service: General;  Laterality: N/A;  . TONSILLECTOMY      OB History   No obstetric history on file.     Allergies  Allergen Reactions  . Topamax [Topiramate] Anaphylaxis    Memory issues  . Celexa [Citalopram Hydrobromide] Nausea And Vomiting  . Fosamax [Alendronate Sodium]     Esophagitis   . Neurontin [Gabapentin]     Drowsy   . Soma [Carisoprodol]     dizzy    Social History   Socioeconomic History  . Marital status: Divorced    Spouse name: Not on file  . Number of children: 1  . Years of education: HS  . Highest education level: Not on file  Occupational History  . Occupation: retired  Tobacco Use  . Smoking status: Never Smoker  . Smokeless tobacco: Never Used  Substance and Sexual Activity  . Alcohol use: No  . Drug use: No  . Sexual activity: Not Currently    Birth control/protection: Post-menopausal  Other Topics Concern  . Not on file  Social History Narrative   Patient lives at home alone.   Caffeine Use: daily (BC podwder)   Social Determinants of Health   Financial Resource Strain:   . Difficulty of Paying Living Expenses:   Food Insecurity:   . Worried About Sales executive in the Last Year:   . Arboriculturist in the Last Year:   Transportation Needs:   . Film/video editor (Medical):   Marland Kitchen Lack of Transportation (Non-Medical):   Physical Activity:   . Days of Exercise per Week:   . Minutes of Exercise per Session:   Stress:   . Feeling of Stress :   Social Connections:   . Frequency of Communication with Friends and Family:   . Frequency of Social Gatherings with Friends and Family:   . Attends Religious Services:   . Active Member of Clubs or Organizations:   . Attends Archivist Meetings:   Marland Kitchen Marital Status:     Family History  Problem Relation Age of  Onset  . Stroke Mother   . Heart disease Mother   . Diabetes Mother   . Heart disease Father   . Cancer - Colon Father   . Other Brother     Medications:       Current Outpatient Medications:  .  ALPRAZolam (XANAX) 0.5 MG tablet, TAKE 1 TABLET BY MOUTH TWICE DAILY AS NEEDED FOR ANXIETY, Disp: 60 tablet, Rfl: 5 .  esomeprazole (NEXIUM) 20 MG capsule, Take 20 mg by mouth daily. , Disp: , Rfl:  .  famotidine (PEPCID) 20 MG tablet, TAKE 1 TABLET(20 MG) BY MOUTH TWICE DAILY, Disp: 60 tablet, Rfl: 2 .  fluticasone (FLONASE) 50 MCG/ACT nasal spray, INSTILL 2 SPRAYS INTO EACH NOSTRIL ONCE DAILY, Disp: 16 g, Rfl: 5 .  hydrochlorothiazide (HYDRODIURIL) 25 MG tablet, TAKE 1 TABLET(25 MG) BY MOUTH DAILY, Disp: 90 tablet, Rfl: 1 .  levothyroxine (SYNTHROID) 25 MCG tablet, Take 7 tablets po each Thursday, Disp: 90 tablet, Rfl: 1 .  lisinopril (ZESTRIL) 2.5 MG tablet, Take 1 tablet (2.5 mg total) by mouth daily., Disp: 30 tablet, Rfl: 5 .  loratadine (CLARITIN) 10 MG tablet, Take 10 mg by mouth daily as needed for allergies. , Disp: , Rfl:  .  nortriptyline (PAMELOR) 10 MG capsule, Take one capsule at night for 2 weeks, then take 2 capsules at night, Disp: 180 capsule, Rfl: 0 .  oxyCODONE (OXY IR/ROXICODONE) 5 MG immediate release tablet, Take 1 tablet (5 mg total) by mouth every 4 (four)  hours as needed for severe pain., Disp: 180 tablet, Rfl: 0 .  oxyCODONE (ROXICODONE) 5 MG immediate release tablet, Take 1 tablet (5 mg total) by mouth every 4 (four) hours as needed for severe pain, Disp: 180 tablet, Rfl: 0 .  oxyCODONE (ROXICODONE) 5 MG immediate release tablet, Take 1 tablet (5 mg total) by mouth every 4 (four) hours as needed for severe pain, Disp: 180 tablet, Rfl: 0 .  polyethylene glycol powder (GLYCOLAX/MIRALAX) powder, Take 17 g by mouth daily. , Disp: , Rfl:  .  pravastatin (PRAVACHOL) 20 MG tablet, TAKE 1 TABLET EVERY DAY ON MONDAY WEDNESDAYS AND FRIDAYS, Disp: 12 tablet, Rfl: 5 .  tiZANidine (ZANAFLEX) 4 MG tablet, TAKE 1/2 TO 1 TABLET BY MOUTH EVERY NIGHT AS NEEDED FOR MUSCLE SPASMS, Disp: 30 tablet, Rfl: 2 .  tobramycin-dexamethasone (TOBRADEX) ophthalmic solution, Place 1 drop into the right eye 4 (four) times daily. Up to 5 days, Disp: 5 mL, Rfl: 0 .  triamcinolone cream (KENALOG) 0.1 %, Apply 1 application topically 2 (two) times daily. (Patient taking differently: Apply 1 application topically daily as needed (rash). ), Disp: 15 g, Rfl: 4  Objective Blood pressure 131/78, pulse 95, weight 185 lb (83.9 kg).  General WDWN female NAD Vulva:  normal appearing vulva with no masses, tenderness or lesions atrophic Vagina:  normal mucosa, no discharge atrophic Cervix:  Normal no lesions Uterus:  normal size, contour, position, consistency, mobility, non-tender Grade 2 uterine prolapse, fit for a pessary today Adnexa: ovaries:present,  normal adnexa in size, nontender and no masses   Pertinent ROS No burning with urination, frequency or urgency No nausea, vomiting or diarrhea Nor fever chills or other constitutional symptoms   Labs or studies     Impression Diagnoses this Encounter::   ICD-10-CM   1. Uterine prolapse, Grade 2  N81.4    Fit for Milex ring with support #3 with good response  2. Pelvic pressure in female  R10.2     Established  relevant  diagnosis(es):   Plan/Recommendations: No orders of the defined types were placed in this encounter.   Labs or Scans Ordered: No orders of the defined types were placed in this encounter.   Management:: Milex ring with support #3 fit with good relief of chronic pressure sensation  Follow up Return in about 1 month (around 05/10/2020) for Follow up, with Dr Elonda Husky.      All questions were answered.

## 2020-04-09 NOTE — Telephone Encounter (Signed)
Pt called to let us know she received the moderna vaccine 3/26 and 4/28

## 2020-04-17 ENCOUNTER — Other Ambulatory Visit: Payer: Self-pay | Admitting: Family Medicine

## 2020-04-17 NOTE — Telephone Encounter (Signed)
Seen 03/16/20 for pain management

## 2020-05-08 ENCOUNTER — Other Ambulatory Visit: Payer: Self-pay

## 2020-05-08 ENCOUNTER — Telehealth: Payer: Self-pay | Admitting: *Deleted

## 2020-05-08 ENCOUNTER — Telehealth (INDEPENDENT_AMBULATORY_CARE_PROVIDER_SITE_OTHER): Payer: Medicare Other | Admitting: Family Medicine

## 2020-05-08 DIAGNOSIS — D509 Iron deficiency anemia, unspecified: Secondary | ICD-10-CM | POA: Diagnosis not present

## 2020-05-08 DIAGNOSIS — K219 Gastro-esophageal reflux disease without esophagitis: Secondary | ICD-10-CM | POA: Diagnosis not present

## 2020-05-08 DIAGNOSIS — I10 Essential (primary) hypertension: Secondary | ICD-10-CM

## 2020-05-08 DIAGNOSIS — E038 Other specified hypothyroidism: Secondary | ICD-10-CM

## 2020-05-08 DIAGNOSIS — F411 Generalized anxiety disorder: Secondary | ICD-10-CM

## 2020-05-08 DIAGNOSIS — M545 Low back pain, unspecified: Secondary | ICD-10-CM

## 2020-05-08 DIAGNOSIS — G8929 Other chronic pain: Secondary | ICD-10-CM

## 2020-05-08 MED ORDER — OXYCODONE HCL 5 MG PO TABS: 5.0000 mg | ORAL_TABLET | ORAL | 0 refills | Status: DC | PRN

## 2020-05-08 MED ORDER — OXYCODONE HCL 5 MG PO TABS: ORAL_TABLET | ORAL | 0 refills | Status: DC

## 2020-05-08 MED ORDER — PRAVASTATIN SODIUM 20 MG PO TABS: ORAL_TABLET | ORAL | 5 refills | Status: DC

## 2020-05-08 NOTE — Progress Notes (Signed)
   Subjective:    Patient ID: Amanda Osborne, female    DOB: 12/29/1950, 69 y.o.   MRN: [id number]  HPI  Patient calls to discuss medications. Patient states she has decided she does not want to see specialist or do the iron infusion. Extensive discussion Very nice patient Patient has difficult time with finances   Patient has also decided not to do colonoscopy.   Patient states she does not want to take the Crestor but would be willing to do Pravachol daily.   Virtual Visit via Video Note  I connected with [name] on 05/08/20 at 10:00 AM EDT by a video enabled telemedicine application and verified that I am speaking with the correct person using two identifiers.  Location: Patient: home Provider: office   I discussed the limitations of evaluation and management by telemedicine and the availability of in person appointments. The patient expressed understanding and agreed to proceed.  History of Present Illness:    Observations/Objective:   Assessment and Plan:   Follow Up Instructions:    I discussed the assessment and treatment plan with the patient. The patient was provided an opportunity to ask questions and all were answered. The patient agreed with the plan and demonstrated an understanding of the instructions.   The patient was advised to call back or seek an in-person evaluation if the symptoms worsen or if the condition fails to improve as anticipated.  I provided 20 minutes of non-face-to-face time during this encounter.      Review of Systems     Objective:   Physical Exam  Today's visit was via telephone Physical exam was not possible for this visit       Assessment & Plan:  1. Chronic bilateral low back pain, unspecified whether sciatica present Update on pain medicine given.  2 additional scripts follow-up in September drug registry checked patient is legitimate and how she takes her medicine  2. Iron deficiency anemia,  unspecified iron deficiency anemia type She cannot afford iron infusion because of the hospital charges therefore she will double up on iron tablets number recheck her lab work again in several months  3. Other specified hypothyroidism Thyroid continue current medication.  Check lab work later in the fall  4. HTN (hypertension), benign Blood pressure reportedly under good control  5. Gastroesophageal reflux disease without esophagitis Reflux under good control  6. GAD (generalized anxiety disorder) Denies anxiety related issues, patient has been on anxiety medicine for years.  She is not tolerated antidepressants well.  She does not want to go to a counselor or psychiatrist.

## 2020-05-08 NOTE — Telephone Encounter (Signed)
Amanda Osborne, Amanda Osborne are scheduled for a virtual visit with your provider today.    Just as we do with appointments in the office, we must obtain your consent to participate.  Your consent will be active for this visit and any virtual visit you may have with one of our providers in the next 365 days.    If you have a MyChart account, I can also send a copy of this consent to you electronically.  All virtual visits are billed to your insurance company just like a traditional visit in the office.  As this is a virtual visit, video technology does not allow for your provider to perform a traditional examination.  This may limit your provider's ability to fully assess your condition.  If your provider identifies any concerns that need to be evaluated in person or the need to arrange testing such as labs, EKG, etc, we will make arrangements to do so.    Although advances in technology are sophisticated, we cannot ensure that it will always work on either your end or our end.  If the connection with a video visit is poor, we may have to switch to a telephone visit.  With either a video or telephone visit, we are not always able to ensure that we have a secure connection.   I need to obtain your verbal consent now.   Are you willing to proceed with your visit today?   Amanda Osborne has provided verbal consent on 05/08/2020 for a virtual visit (video or telephone).   Mitzie Na, RN 05/08/2020

## 2020-05-11 ENCOUNTER — Encounter: Payer: Self-pay | Admitting: Obstetrics & Gynecology

## 2020-05-11 ENCOUNTER — Ambulatory Visit: Payer: Medicare Other | Admitting: Obstetrics & Gynecology

## 2020-05-11 VITALS — BP 121/74 | HR 84 | Ht 66.5 in | Wt 186.0 lb

## 2020-05-11 DIAGNOSIS — Z4689 Encounter for fitting and adjustment of other specified devices: Secondary | ICD-10-CM | POA: Diagnosis not present

## 2020-05-11 DIAGNOSIS — N814 Uterovaginal prolapse, unspecified: Secondary | ICD-10-CM | POA: Diagnosis not present

## 2020-05-11 NOTE — Progress Notes (Signed)
Chief Complaint  Patient presents with  . Follow-up    on pessary    Blood pressure 121/74, pulse 84, height 5' 6.5" (1.689 m), weight 186 lb (84.4 kg).  Amanda Osborne presents today for routine follow up related to her pessary.   She uses a Milex ring with support #3 She reports no vaginal discharge or vaginal bleeding.  Had 5 days of discharge which spontaneously resolved  Exam reveals no undue vaginal mucosal pressure of breakdown, no discharge and no vaginal bleeding.  The pessary is removed, cleaned and replaced without difficulty.      ICD-10-CM   1. Pessary maintenance, Milex ring with support #3, 5/21  Z46.89   2. Uterine prolapse, Grade 2  N81.4     Amanda Osborne will be sen back in 3 months for continued follow up.  Florian Buff, MD  05/11/2020 2:10 PM

## 2020-06-09 ENCOUNTER — Other Ambulatory Visit: Payer: Self-pay | Admitting: Family Medicine

## 2020-06-15 ENCOUNTER — Ambulatory Visit: Payer: Medicare Other | Admitting: Family Medicine

## 2020-06-29 ENCOUNTER — Encounter: Payer: Self-pay | Admitting: Family Medicine

## 2020-06-29 DIAGNOSIS — E038 Other specified hypothyroidism: Secondary | ICD-10-CM

## 2020-06-29 DIAGNOSIS — D509 Iron deficiency anemia, unspecified: Secondary | ICD-10-CM

## 2020-06-29 NOTE — Telephone Encounter (Signed)
Nurses Please order TSH, free T4, CBC, ferritin, TIBC Iron deficient anemia, hypothyroidism

## 2020-06-30 NOTE — Addendum Note (Signed)
Addended by: Vicente Males on: 06/30/2020 08:33 AM   Modules accepted: Orders

## 2020-07-01 DIAGNOSIS — D509 Iron deficiency anemia, unspecified: Secondary | ICD-10-CM | POA: Diagnosis not present

## 2020-07-01 DIAGNOSIS — E038 Other specified hypothyroidism: Secondary | ICD-10-CM | POA: Diagnosis not present

## 2020-07-02 ENCOUNTER — Ambulatory Visit (INDEPENDENT_AMBULATORY_CARE_PROVIDER_SITE_OTHER): Payer: Medicare Other | Admitting: Gastroenterology

## 2020-07-02 ENCOUNTER — Other Ambulatory Visit: Payer: Self-pay

## 2020-07-02 ENCOUNTER — Encounter (INDEPENDENT_AMBULATORY_CARE_PROVIDER_SITE_OTHER): Payer: Self-pay | Admitting: Gastroenterology

## 2020-07-02 ENCOUNTER — Ambulatory Visit
Admission: EM | Admit: 2020-07-02 | Discharge: 2020-07-02 | Disposition: A | Discharge status: Home / Self Care | Payer: Medicare Other | Attending: Emergency Medicine | Admitting: Emergency Medicine

## 2020-07-02 VITALS — BP 116/75 | HR 78 | Temp 97.4°F | Ht 66.5 in | Wt 190.6 lb

## 2020-07-02 DIAGNOSIS — N3001 Acute cystitis with hematuria: Secondary | ICD-10-CM

## 2020-07-02 DIAGNOSIS — D509 Iron deficiency anemia, unspecified: Secondary | ICD-10-CM

## 2020-07-02 DIAGNOSIS — R195 Other fecal abnormalities: Secondary | ICD-10-CM | POA: Diagnosis not present

## 2020-07-02 DIAGNOSIS — Z800 Family history of malignant neoplasm of digestive organs: Secondary | ICD-10-CM | POA: Diagnosis not present

## 2020-07-02 DIAGNOSIS — K219 Gastro-esophageal reflux disease without esophagitis: Secondary | ICD-10-CM

## 2020-07-02 DIAGNOSIS — R300 Dysuria: Secondary | ICD-10-CM | POA: Insufficient documentation

## 2020-07-02 LAB — IRON AND TIBC
Iron Saturation: 16 % (ref 15–55)
Iron: 68 ug/dL (ref 27–139)
Total Iron Binding Capacity: 413 ug/dL (ref 250–450)
UIBC: 345 ug/dL (ref 118–369)

## 2020-07-02 LAB — CBC WITH DIFFERENTIAL/PLATELET
Basophils Absolute: 0 10*3/uL (ref 0.0–0.2)
Basos: 1 %
EOS (ABSOLUTE): 0.4 10*3/uL (ref 0.0–0.4)
Eos: 7 %
Hematocrit: 38.3 % (ref 34.0–46.6)
Hemoglobin: 11.6 g/dL (ref 11.1–15.9)
Immature Grans (Abs): 0 10*3/uL (ref 0.0–0.1)
Immature Granulocytes: 0 %
Lymphocytes Absolute: 1.2 10*3/uL (ref 0.7–3.1)
Lymphs: 23 %
MCH: 23.9 pg — ABNORMAL LOW (ref 26.6–33.0)
MCHC: 30.3 g/dL — ABNORMAL LOW (ref 31.5–35.7)
MCV: 79 fL (ref 79–97)
Monocytes Absolute: 0.5 10*3/uL (ref 0.1–0.9)
Monocytes: 9 %
Neutrophils Absolute: 3.2 10*3/uL (ref 1.4–7.0)
Neutrophils: 60 %
Platelets: 292 10*3/uL (ref 150–450)
RBC: 4.85 x10E6/uL (ref 3.77–5.28)
RDW: 15.9 % — ABNORMAL HIGH (ref 11.7–15.4)
WBC: 5.3 10*3/uL (ref 3.4–10.8)

## 2020-07-02 LAB — T4, FREE: Free T4: 0.96 ng/dL (ref 0.82–1.77)

## 2020-07-02 LAB — TSH: TSH: 5.25 u[IU]/mL — ABNORMAL HIGH (ref 0.450–4.500)

## 2020-07-02 LAB — POCT URINALYSIS DIP (MANUAL ENTRY)
Bilirubin, UA: NEGATIVE
Glucose, UA: 250 mg/dL — AB
Nitrite, UA: POSITIVE — AB
Protein Ur, POC: 100 mg/dL — AB
Spec Grav, UA: 1.025 (ref 1.010–1.025)
Urobilinogen, UA: 2 E.U./dL — AB
pH, UA: 5 (ref 5.0–8.0)

## 2020-07-02 LAB — FERRITIN: Ferritin: 15 ng/mL (ref 15–150)

## 2020-07-02 MED ORDER — CEPHALEXIN 500 MG PO CAPS: 500.0000 mg | ORAL_CAPSULE | Freq: Two times a day (BID) | ORAL | 0 refills | Status: AC

## 2020-07-02 NOTE — Patient Instructions (Signed)
Please call us when ready to schedule colonoscopy

## 2020-07-02 NOTE — ED Triage Notes (Signed)
Pt presents with c/o dysuria for past couple of days  

## 2020-07-02 NOTE — Discharge Instructions (Signed)
Push fluids and get plenty of rest.   Take antibiotic as directed and to completion Follow up with PCP if symptoms persists Return here or go to ER if you have any new or worsening symptoms such as fever, worsening abdominal pain, nausea/vomiting, flank pain, etc... 

## 2020-07-02 NOTE — ED Provider Notes (Signed)
[facility]   CC: Burning with urination  SUBJECTIVE:  Amanda Osborne is a 69 y.o. female who complains of dysuria x 3-4 days.  Patient denies a precipitating event, recent sexual encounter, excessive caffeine intake.  Denies abdominal pain.  Has tried OTC AZO without relief.  Symptoms are made worse with urination.  Admits to similar symptoms in the past.  Denies fever, chills, nausea, vomiting, abdominal pain, flank pain, abnormal vaginal discharge or bleeding, hematuria.    LMP: No LMP recorded. Patient is postmenopausal.  ROS: As in HPI.  All other pertinent ROS negative.     Past Medical History:  Diagnosis Date   Anxiety    Arthritis    Cervical disc herniation    c5 and c6   Cervicogenic headache 05/13/2019   Chronic pain    Complication of anesthesia    Fibromyalgia    GERD (gastroesophageal reflux disease)    Headache(784.0)    Hx: of migraines until age 42's   Hyperlipidemia    Hypertension    Iron deficiency anemia 11/30/2012   Left carpal tunnel syndrome 11/07/2018   Narcolepsy    Occipital neuralgia    PONV (postoperative nausea and vomiting)    DURING FIRST NECK SURGERY ; BUT REPORT IT MAY HAVE BEEN THE DILAUDID THAT CAUSED THE VOITING I   Unspecified vitamin D deficiency 03/26/2013   Wrist fracture, bilateral 2005 and 2010   right then left   Past Surgical History:  Procedure Laterality Date   ANTERIOR CERVICAL DECOMP/DISCECTOMY FUSION  2000   C6-C7 DR [facility]    ANTERIOR CERVICAL DECOMP/DISCECTOMY FUSION N/A 08/05/2013   Procedure: Cervical five-six Anterior cervical decompression/diskectomy/fusion/ Synthes plate removal;  Surgeon: Kristeen Miss, MD;  Location: [facility];  Service: Neurosurgery;  Laterality: N/A;   BONE GRAFT HIP ILIAC CREST  2006   to right wrist to correct fracture, DR. [place] [facility]O    COLONOSCOPY     Hx; of   DILATION AND CURETTAGE OF UTERUS     ESOPHAGEAL DILATION N/A 10/22/2015   Procedure:  ESOPHAGEAL DILATION;  Surgeon: Rogene Houston, MD;  [facility]: [facility];  Service: Endoscopy;  Laterality: N/A;   ESOPHAGOGASTRODUODENOSCOPY N/A 08/28/2015   Procedure: ESOPHAGOGASTRODUODENOSCOPY (EGD);  Surgeon: Rogene Houston, MD;  [facility]: [facility];  Service: Endoscopy;  Laterality: N/A;  910am   ESOPHAGOGASTRODUODENOSCOPY N/A 10/22/2015   Procedure: ESOPHAGOGASTRODUODENOSCOPY (EGD);  Surgeon: Rogene Houston, MD;  [facility]: [facility];  Service: Endoscopy;  Laterality: N/A;  12:00   FRACTURE SURGERY     Hx: of right wrist surgery   GASTRIC BYPASS  1981   INCISIONAL HERNIA REPAIR N/A 06/14/2018   Procedure: LAPAROSCOPIC INCISIONAL HERNIA REPAIR WITH INSERTION OF MESH ERAS PATHWAY;  Surgeon: Kinsinger, Arta Bruce, MD;  Location: [facility];  Service: General;  Laterality: N/A;   TONSILLECTOMY     Allergies  Allergen Reactions   Topamax [Topiramate] Anaphylaxis    Memory issues   Celexa [Citalopram Hydrobromide] Nausea And Vomiting   Fosamax [Alendronate Sodium]     Esophagitis    Neurontin [Gabapentin]     Drowsy    Soma [Carisoprodol]     dizzy   No current facility-administered medications on file prior to encounter.   Current Outpatient Medications on File Prior to Encounter  Medication Sig Dispense Refill   ALPRAZolam (XANAX) 0.5 MG tablet TAKE 1 TABLET BY MOUTH TWICE DAILY AS NEEDED FOR ANXIETY 60 tablet 5   esomeprazole (NEXIUM) 20 MG capsule  Take 20 mg by mouth daily.      famotidine (PEPCID) 20 MG tablet TAKE 1 TABLET(20 MG) BY MOUTH TWICE DAILY 60 tablet 2   fluticasone (FLONASE) 50 MCG/ACT nasal spray INSTILL 2 SPRAYS INTO EACH NOSTRIL ONCE DAILY 16 g 5   hydrochlorothiazide (HYDRODIURIL) 25 MG tablet TAKE 1 TABLET(25 MG) BY MOUTH DAILY 90 tablet 0   levothyroxine (SYNTHROID) 25 MCG tablet Take 7 tablets po each Thursday 90 tablet 1   lisinopril (ZESTRIL) 2.5 MG tablet Take 1 tablet (2.5 mg total) by mouth daily. 30 tablet 5   loratadine  (CLARITIN) 10 MG tablet Take 10 mg by mouth daily as needed for allergies.      nortriptyline (PAMELOR) 10 MG capsule Take one capsule at night for 2 weeks, then take 2 capsules at night 180 capsule 0   oxyCODONE (OXY IR/ROXICODONE) 5 MG immediate release tablet Take 1 tablet (5 mg total) by mouth every 4 (four) hours as needed for severe pain. 180 tablet 0   oxyCODONE (ROXICODONE) 5 MG immediate release tablet Take 1 tablet (5 mg total) by mouth every 4 (four) hours as needed for severe pain (Patient not taking: Reported on 07/02/2020) 180 tablet 0   oxyCODONE (ROXICODONE) 5 MG immediate release tablet Take 1 tablet (5 mg total) by mouth every 4 (four) hours as needed for severe pain (Patient not taking: Reported on 07/02/2020) 180 tablet 0   polyethylene glycol powder (GLYCOLAX/MIRALAX) powder Take 17 g by mouth daily.      pravastatin (PRAVACHOL) 20 MG tablet TAKE 1 TABLET DAILY 30 tablet 5   tiZANidine (ZANAFLEX) 4 MG tablet TAKE 1/2 TO 1 TABLET BY MOUTH EVERY NIGHT AS NEEDED FOR MUSCLE SPASMS 30 tablet 2   tobramycin-dexamethasone (TOBRADEX) ophthalmic solution Place 1 drop into the right eye 4 (four) times daily. Up to 5 days 5 mL 0   triamcinolone cream (KENALOG) 0.1 % Apply 1 application topically 2 (two) times daily. (Patient taking differently: Apply 1 application topically daily as needed (rash). ) 15 g 4   UNABLE TO FIND Fanny cream-prn     Social History   Socioeconomic History   Marital status: Divorced    Spouse name: Not on file   Number of children: 1   Years of education: HS   Highest education level: Not on file  Occupational History   Occupation: retired  Tobacco Use   Smoking status: Never Smoker   Smokeless tobacco: Never Used  Scientific laboratory technician Use: Never used  Substance and Sexual Activity   Alcohol use: No   Drug use: No   Sexual activity: Not Currently    Birth control/protection: Post-menopausal  Other Topics Concern   Not on file    Social History Narrative   Patient lives at home alone.   Caffeine Use: daily (BC podwder)   Social Determinants of Health   Financial Resource Strain:    Difficulty of Paying Living Expenses:   Food Insecurity:    Worried About Charity fundraiser in the Last Year:    Arboriculturist in the Last Year:   Transportation Needs:    Film/video editor (Medical):    Lack of Transportation (Non-Medical):   Physical Activity:    Days of Exercise per Week:    Minutes of Exercise per Session:   Stress:    Feeling of Stress :   Social Connections:    Frequency of Communication with Friends and Family:  Frequency of Social Gatherings with Friends and Family:    Attends Religious Services:    Active Member of Clubs or Organizations:    Attends Music therapist:    Marital Status:   Intimate Partner Violence:    Fear of Current or Ex-Partner:    Emotionally Abused:    Physically Abused:    Sexually Abused:    Family History  Problem Relation Age of Onset   Stroke Mother    Heart disease Mother    Diabetes Mother    Heart disease Father    Cancer - Colon Father    Other Brother     OBJECTIVE:  Vitals:   07/02/20 1304  BP: 130/76  Pulse: 91  Resp: 16  Temp: 98.5 F (36.9 C)  TempSrc: Oral  SpO2: 97%   General appearance: Alert in no acute distress HEENT: NCAT.  Oropharynx clear.  Lungs: clear to auscultation bilaterally without adventitious breath sounds Heart: regular rate and rhythm.  Abdomen: soft; non-distended; no tenderness; bowel sounds present; no guarding Back: no CVA tenderness Extremities: no edema; symmetrical with no gross deformities Skin: warm and dry Neurologic: Ambulates from chair to exam table without difficulty Psychological: alert and cooperative; normal mood and affect  Labs Reviewed  POCT URINALYSIS DIP (MANUAL ENTRY) - Abnormal; Notable for the following components:      Result Value   Color, UA  orange (*)    Glucose, UA =250 (*)    Ketones, POC UA trace (5) (*)    Blood, UA large (*)    Protein Ur, POC =100 (*)    Urobilinogen, UA 2.0 (*)    Nitrite, UA Positive (*)    Leukocytes, UA Large (3+) (*)    All other components within normal limits  URINE CULTURE    ASSESSMENT & PLAN:  1. Dysuria   2. Acute cystitis with hematuria     Meds ordered this encounter  Medications   cephALEXin (KEFLEX) 500 MG capsule    Sig: Take 1 capsule (500 mg total) by mouth 2 (two) times daily for 10 days.    Dispense:  20 capsule    Refill:  0    Order Specific Question:   Supervising Provider    Answer:   Raylene Everts [[id number]]   Push fluids and get plenty of rest.   Take antibiotic as directed and to completion Follow up with PCP if symptoms persists Return here or go to ER if you have any new or worsening symptoms such as fever, worsening abdominal pain, nausea/vomiting, flank pain, etc...  Outlined signs and symptoms indicating need for more acute intervention. Patient verbalized understanding. After Visit Summary given.     Lestine Box, PA-C 07/02/20 1327

## 2020-07-02 NOTE — Progress Notes (Signed)
Patient profile: Amanda Osborne is a 69 y.o. female seen for follow-up, she was last seen January 2021  History of Present Illness: Amanda Osborne is seen today for follow up. She was last seen 11/2019 -at that visit she was recommended to have an endoscopy colonoscopy for evaluation of anemia and heme positive stool.  She was unable to arrange transportation for procedures. Returns to office to discuss rescheduling.   She is seen today in follow-up.  She reports overall feeling much better than she did when she had her visit in January.  She was previously using 6 ibuprofen daily on an empty stomach.  She has switched to taking these with food and feels that her reflux and epigastric pain have resolved.  She is no longer having any nausea vomiting.  She takes Nexium 20 mg each morning then Pepcid as needed if she eats a late meal.  She overall feels this controls her symptoms well.  She does have mild dysphagia, particularly foods such as meat, states that it can take her an hour to eat a hamburger.  Typically dysphagia occurs in her upper esophageal area.  She denies any liquid dysphagia.  Breaks large pills in half to prevent dysphagia.   Takes miralax 3x/week to prevent constipation.  Typically moving her bowels daily and denies any dark stools or rectal bleeding.  No lower abdominal pain.  Wt Readings from Last 3 Encounters:  07/02/20 190 lb 9.6 oz (86.5 kg)  05/11/20 186 lb (84.4 kg)  04/09/20 185 lb (83.9 kg)     Last Colonoscopy:  Last Endoscopy:    Past Medical History:  Past Medical History:  Diagnosis Date  . Anxiety   . Arthritis   . Cervical disc herniation    c5 and c6  . Cervicogenic headache 05/13/2019  . Chronic pain   . Complication of anesthesia   . Fibromyalgia   . GERD (gastroesophageal reflux disease)   . Headache(784.0)    Hx: of migraines until age 89's  . Hyperlipidemia   . Hypertension   . Iron deficiency anemia 11/30/2012  . Left carpal tunnel  syndrome 11/07/2018  . Narcolepsy   . Occipital neuralgia   . PONV (postoperative nausea and vomiting)    DURING FIRST NECK SURGERY ; BUT REPORT IT MAY HAVE BEEN THE DILAUDID THAT CAUSED THE VOITING I  . Unspecified vitamin D deficiency 03/26/2013  . Wrist fracture, bilateral 2005 and 2010   right then left    Problem List: Patient Active Problem List   Diagnosis Date Noted  . Cervicogenic headache 05/13/2019  . Left carpal tunnel syndrome 11/07/2018  . Incisional hernia 06/14/2018  . Hypothyroid 02/27/2018  . IDA (iron deficiency anemia) 08/10/2017  . GERD (gastroesophageal reflux disease) 05/01/2015  . Osteoporosis 12/22/2014  . Occipital neuralgia 09/16/2014  . Spasmodic torticollis 09/16/2014  . HTN (hypertension), benign 03/17/2014  . Thyroid nodule 03/17/2014  . Right ankle pain 12/20/2013  . Spondylosis, cervical, with myelopathy C5-6 08/06/2013  . Kyphosis 05/02/2013  . Hyperlipidemia 05/02/2013  . Chronic back pain 03/29/2013  . Generalized anxiety disorder 03/29/2013  . Depression with anxiety 03/29/2013  . H/O gastric bypass 03/29/2013  . Unspecified vitamin D deficiency 03/26/2013  . Iron deficiency anemia 11/30/2012    Past Surgical History: Past Surgical History:  Procedure Laterality Date  . ANTERIOR CERVICAL DECOMP/DISCECTOMY FUSION  2000   C6-C7 DR [place] Route   . ANTERIOR CERVICAL DECOMP/DISCECTOMY FUSION N/A 08/05/2013   Procedure: Cervical five-six Anterior cervical  decompression/diskectomy/fusion/ Synthes plate removal;  Surgeon: Kristeen Miss, MD;  Location: [place] [facility];  Service: Neurosurgery;  Laterality: N/A;  . BONE GRAFT HIP ILIAC CREST  2006   to right wrist to correct fracture, DR. [place] [facility]   . COLONOSCOPY     Hx; of  . DILATION AND CURETTAGE OF UTERUS    . ESOPHAGEAL DILATION N/A 10/22/2015   Procedure: ESOPHAGEAL DILATION;  Surgeon: Rogene Houston, MD;  [facility];  Service: Endoscopy;  Laterality: N/A;  .  ESOPHAGOGASTRODUODENOSCOPY N/A 08/28/2015   Procedure: ESOPHAGOGASTRODUODENOSCOPY (EGD);  Surgeon: Rogene Houston, MD;  [facility];  Service: Endoscopy;  Laterality: N/A;  910am  . ESOPHAGOGASTRODUODENOSCOPY N/A 10/22/2015   Procedure: ESOPHAGOGASTRODUODENOSCOPY (EGD);  Surgeon: Rogene Houston, MD;  [facility];  Service: Endoscopy;  Laterality: N/A;  12:00  . FRACTURE SURGERY     Hx: of right wrist surgery  . GASTRIC BYPASS  1981  . INCISIONAL HERNIA REPAIR N/A 06/14/2018   Procedure: LAPAROSCOPIC INCISIONAL HERNIA REPAIR WITH INSERTION OF MESH ERAS PATHWAY;  Surgeon: Kinsinger, Arta Bruce, MD;  Location: [facility];  Service: General;  Laterality: N/A;  . TONSILLECTOMY      Allergies: Allergies  Allergen Reactions  . Topamax [Topiramate] Anaphylaxis    Memory issues  . Celexa [Citalopram Hydrobromide] Nausea And Vomiting  . Fosamax [Alendronate Sodium]     Esophagitis   . Neurontin [Gabapentin]     Drowsy   . Soma [Carisoprodol]     dizzy      Home Medications:  Current Outpatient Medications:  .  ALPRAZolam (XANAX) 0.5 MG tablet, TAKE 1 TABLET BY MOUTH TWICE DAILY AS NEEDED FOR ANXIETY, Disp: 60 tablet, Rfl: 5 .  esomeprazole (NEXIUM) 20 MG capsule, Take 20 mg by mouth daily. , Disp: , Rfl:  .  famotidine (PEPCID) 20 MG tablet, TAKE 1 TABLET(20 MG) BY MOUTH TWICE DAILY, Disp: 60 tablet, Rfl: 2 .  fluticasone (FLONASE) 50 MCG/ACT nasal spray, INSTILL 2 SPRAYS INTO EACH NOSTRIL ONCE DAILY, Disp: 16 g, Rfl: 5 .  hydrochlorothiazide (HYDRODIURIL) 25 MG tablet, TAKE 1 TABLET(25 MG) BY MOUTH DAILY, Disp: 90 tablet, Rfl: 0 .  levothyroxine (SYNTHROID) 25 MCG tablet, Take 7 tablets po each Thursday, Disp: 90 tablet, Rfl: 1 .  lisinopril (ZESTRIL) 2.5 MG tablet, Take 1 tablet (2.5 mg total) by mouth daily., Disp: 30 tablet, Rfl: 5 .  loratadine (CLARITIN) 10 MG tablet, Take 10 mg by mouth daily as needed for allergies. , Disp: , Rfl:  .  nortriptyline (PAMELOR) 10  MG capsule, Take one capsule at night for 2 weeks, then take 2 capsules at night, Disp: 180 capsule, Rfl: 0 .  oxyCODONE (OXY IR/ROXICODONE) 5 MG immediate release tablet, Take 1 tablet (5 mg total) by mouth every 4 (four) hours as needed for severe pain., Disp: 180 tablet, Rfl: 0 .  polyethylene glycol powder (GLYCOLAX/MIRALAX) powder, Take 17 g by mouth daily. , Disp: , Rfl:  .  pravastatin (PRAVACHOL) 20 MG tablet, TAKE 1 TABLET DAILY, Disp: 30 tablet, Rfl: 5 .  tiZANidine (ZANAFLEX) 4 MG tablet, TAKE 1/2 TO 1 TABLET BY MOUTH EVERY NIGHT AS NEEDED FOR MUSCLE SPASMS, Disp: 30 tablet, Rfl: 2 .  tobramycin-dexamethasone (TOBRADEX) ophthalmic solution, Place 1 drop into the right eye 4 (four) times daily. Up to 5 days, Disp: 5 mL, Rfl: 0 .  triamcinolone cream (KENALOG) 0.1 %, Apply 1 application topically 2 (two) times daily. (Patient taking differently: Apply  1 application topically daily as needed (rash). ), Disp: 15 g, Rfl: 4 .  UNABLE TO FIND, Fanny cream-prn, Disp: , Rfl:  .  oxyCODONE (ROXICODONE) 5 MG immediate release tablet, Take 1 tablet (5 mg total) by mouth every 4 (four) hours as needed for severe pain (Patient not taking: Reported on 07/02/2020), Disp: 180 tablet, Rfl: 0 .  oxyCODONE (ROXICODONE) 5 MG immediate release tablet, Take 1 tablet (5 mg total) by mouth every 4 (four) hours as needed for severe pain (Patient not taking: Reported on 07/02/2020), Disp: 180 tablet, Rfl: 0   Family History: family history includes Cancer - Colon in her father; Diabetes in her mother; Heart disease in her father and mother; Other in her brother; Stroke in her mother.    Social History:   reports that she has never smoked. She has never used smokeless tobacco. She reports that she does not drink alcohol and does not use drugs.   Review of Systems: Constitutional: Denies weight loss/weight gain  Eyes: No changes in vision. ENT: No oral lesions, sore throat.  GI: see HPI.  Heme/Lymph: No easy  bruising.  CV: No chest pain.  GU: No hematuria.  Integumentary: No rashes.  Neuro: No headaches.  Psych: No depression/anxiety.  Endocrine: No heat/cold intolerance.  Allergic/Immunologic: No urticaria.  Resp: No cough, SOB.  Musculoskeletal: No joint swelling.    Physical Examination: BP 116/75 (BP Location: Right Arm, Patient Position: Sitting, Cuff Size: Normal)   Pulse 78   Temp (!) 97.4 F (36.3 C) (Oral)   Ht 5' 6.5" (1.689 m)   Wt 190 lb 9.6 oz (86.5 kg)   BMI 30.30 kg/m  Gen: NAD, alert and oriented x 4 HEENT: PEERLA, EOMI, Neck: supple, no JVD Chest: CTA bilaterally, no wheezes, crackles, or other adventitious sounds CV: RRR, no m/g/c/r Abd: soft, NT, ND, +BS in all four quadrants; no HSM, guarding, ridigity, or rebound tenderness Ext: no edema, well perfused with 2+ pulses, Skin: no rash or lesions noted on observed skin Lymph: no noted LAD  Data Reviewed:  Labs from yesterday reviewed-TSH elevated 5.2, ferritin 15 (up from 10 3 months ago), CBC with hemoglobin 11.6.  MCV 79.  Iron 68.   Assessment/Plan: Ms. Fors is a 69 y.o. female seen for follow-up  1.  Positive Hemoccult with history of mild iron deficiency anemia-we previously discussed recommendation for endoscopy/colonoscopy at her initial visit.  She now prefers work-up with only colonoscopy given her ferritin has improved with stopping NSAIDs on empty stomach.  She does have history of gastric bypass and has had chronic issues with iron absorption.   2.  Family history of colon cancer-father, last colonoscopy over 10 years ago, recommend repeat colonoscopy  3.  GERD-well-controlled on current regimen since she has avoided NSAIDs on empty stomach.  Continue low-dose PPI and Pepcid as needed. Diet modifications reviewed.   4. Dysphagia - discussed recommendation for upper endoscopy which she declines.  She reports having EGD years ago with dilation which she feels made her reflux symptoms  worse.  Patient has issues with transportation and will call to schedule colonoscopy when her son will be in town to bring her  Denies prior issues with sedation. Patient denies CP, SOB, and use of blood thinners. I discussed the risks and benefits of procedure including bleeding, perforation, infection, missed lesions, medication reactions and possible hospitalization or surgery if complications. All questions answered.   >24min total pt care time  Beena was seen today for follow-up.  Diagnoses and all orders for this visit:  Iron deficiency anemia, unspecified iron deficiency anemia type  Positive occult stool blood test  Family history of colon cancer  Chronic GERD      I personally performed the service, non-incident to. (WP)  Laurine Blazer, [facility]

## 2020-07-04 LAB — URINE CULTURE: Culture: >=100000 — AB

## 2020-07-18 ENCOUNTER — Other Ambulatory Visit: Payer: Self-pay | Admitting: Family Medicine

## 2020-07-21 ENCOUNTER — Other Ambulatory Visit: Payer: Self-pay

## 2020-07-21 ENCOUNTER — Encounter: Payer: Self-pay | Admitting: Family Medicine

## 2020-07-21 ENCOUNTER — Ambulatory Visit (INDEPENDENT_AMBULATORY_CARE_PROVIDER_SITE_OTHER): Payer: Medicare Other | Admitting: Family Medicine

## 2020-07-21 VITALS — BP 134/70 | HR 76 | Temp 94.8°F | Wt 188.4 lb

## 2020-07-21 DIAGNOSIS — N3090 Cystitis, unspecified without hematuria: Secondary | ICD-10-CM | POA: Insufficient documentation

## 2020-07-21 DIAGNOSIS — R300 Dysuria: Secondary | ICD-10-CM

## 2020-07-21 LAB — POCT GLUCOSE (DEVICE FOR HOME USE): Glucose Fasting, POC: 109 mg/dL — AB (ref 70–99)

## 2020-07-21 LAB — POCT URINALYSIS DIPSTICK
Spec Grav, UA: 1.015 (ref 1.010–1.025)
pH, UA: 5 (ref 5.0–8.0)

## 2020-07-21 MED ORDER — SULFAMETHOXAZOLE-TRIMETHOPRIM 400-80 MG PO TABS: 1.0000 | ORAL_TABLET | Freq: Two times a day (BID) | ORAL | 0 refills | Status: DC

## 2020-07-21 NOTE — Progress Notes (Signed)
Patient ID: Amanda Osborne, female    DOB: [birth year], 69 y.o.   MRN: [id number]   CC: urinary tract infection recently, now with continued pressure.  Subjective:    HPI Pt here for a few things. Pt would like glucose checked due to having sugar in urine. Pt would also like to discuss possible kidney infection. Pt states she went to Urgent Care and they diagnosed her with kidney stone. No burning but does have limited stream and issues with flow starting and stopping. Problem with shoulders; muscles have become tight and the muscles pull her over forward. Pt states she recently loss the ability to swallow but has become better.     Medical History Lavra has a past medical history of Anxiety, Arthritis, Cervical disc herniation, Cervicogenic headache (05/13/2019), Chronic pain, Complication of anesthesia, Fibromyalgia, GERD (gastroesophageal reflux disease), Headache(784.0), Hyperlipidemia, Hypertension, Iron deficiency anemia (11/30/2012), Left carpal tunnel syndrome (11/07/2018), Narcolepsy, Occipital neuralgia, PONV (postoperative nausea and vomiting), Unspecified vitamin D deficiency (03/26/2013), and Wrist fracture, bilateral (2005 and 2010).   Outpatient Encounter Medications as of 07/21/2020  Medication Sig  . ALPRAZolam (XANAX) 0.5 MG tablet TAKE 1 TABLET BY MOUTH TWICE DAILY AS NEEDED FOR ANXIETY  . esomeprazole (NEXIUM) 20 MG capsule Take 20 mg by mouth daily.   . famotidine (PEPCID) 20 MG tablet TAKE 1 TABLET(20 MG) BY MOUTH TWICE DAILY  . fluticasone (FLONASE) 50 MCG/ACT nasal spray INSTILL 2 SPRAYS INTO EACH NOSTRIL ONCE DAILY  . hydrochlorothiazide (HYDRODIURIL) 25 MG tablet TAKE 1 TABLET(25 MG) BY MOUTH DAILY  . levothyroxine (SYNTHROID) 25 MCG tablet Take 7 tablets po each Thursday  . lisinopril (ZESTRIL) 2.5 MG tablet Take 1 tablet (2.5 mg total) by mouth daily.  Marland Kitchen loratadine (CLARITIN) 10 MG tablet Take 10 mg by mouth daily as needed for allergies.   Marland Kitchen nortriptyline  (PAMELOR) 10 MG capsule Take one capsule at night for 2 weeks, then take 2 capsules at night  . oxyCODONE (OXY IR/ROXICODONE) 5 MG immediate release tablet Take 1 tablet (5 mg total) by mouth every 4 (four) hours as needed for severe pain.  Marland Kitchen oxyCODONE (ROXICODONE) 5 MG immediate release tablet Take 1 tablet (5 mg total) by mouth every 4 (four) hours as needed for severe pain  . oxyCODONE (ROXICODONE) 5 MG immediate release tablet Take 1 tablet (5 mg total) by mouth every 4 (four) hours as needed for severe pain  . polyethylene glycol powder (GLYCOLAX/MIRALAX) powder Take 17 g by mouth daily.   . pravastatin (PRAVACHOL) 20 MG tablet TAKE 1 TABLET DAILY  . tiZANidine (ZANAFLEX) 4 MG tablet TAKE 1/2 TO 1 TABLET BY MOUTH EVERY NIGHT AS NEEDED FOR MUSCLE SPASMS  . tobramycin-dexamethasone (TOBRADEX) ophthalmic solution Place 1 drop into the right eye 4 (four) times daily. Up to 5 days  . triamcinolone cream (KENALOG) 0.1 % Apply 1 application topically 2 (two) times daily. (Patient taking differently: Apply 1 application topically daily as needed (rash). )  . UNABLE TO FIND Fanny cream-prn  . sulfamethoxazole-trimethoprim (BACTRIM) 400-80 MG tablet Take 1 tablet by mouth 2 (two) times daily.   No facility-administered encounter medications on file as of 07/21/2020.     Review of Systems  Constitutional: Negative for chills and fever.  Respiratory: Negative.   Genitourinary: Positive for dysuria.       Pressure similar to uterine prolapse pressure. Sees GYN.   Neurological: Negative.   Hematological: Negative for adenopathy.     Vitals BP 134/70   Pulse  76   Temp (!) 94.8 F (34.9 C)   Wt 188 lb 6.4 oz (85.5 kg)   SpO2 98%   BMI 29.95 kg/m   Objective:   Physical Exam Constitutional:      Appearance: Normal appearance.  Cardiovascular:     Rate and Rhythm: Normal rate and regular rhythm.     Pulses: Normal pulses.     Heart sounds: Normal heart sounds.  Pulmonary:     Effort:  Pulmonary effort is normal.     Breath sounds: Normal breath sounds.  Genitourinary:    Comments: Describes as "pressure down there" has uterine prolapse: See GYN: has pessary. Neurological:     Mental Status: She is alert.      Assessment and Plan   1. Dysuria - POCT Glucose (Device for Home Use) - POCT Urinalysis Dipstick - sulfamethoxazole-trimethoprim (BACTRIM) 400-80 MG tablet; Take 1 tablet by mouth 2 (two) times daily.  Dispense: 14 tablet; Refill: 0  2. Cystitis - sulfamethoxazole-trimethoprim (BACTRIM) 400-80 MG tablet; Take 1 tablet by mouth 2 (two) times daily.  Dispense: 14 tablet; Refill: 0   Treatment failure: on Keflex 8/12: completed course of antibiotics, still with symptoms.   Results for orders placed or performed in visit on 07/21/20  POCT Glucose (Device for Home Use)  Result Value Ref Range   Glucose Fasting, POC 109 (A) 70 - 99 mg/dL   POC Glucose    POCT Urinalysis Dipstick  Result Value Ref Range   Color, UA     Clarity, UA     Glucose, UA     Bilirubin, UA     Ketones, UA     Spec Grav, UA 1.015 1.010 - 1.025   Blood, UA     pH, UA 5.0 5.0 - 8.0   Protein, UA     Urobilinogen, UA     Nitrite, UA     Leukocytes, UA Large (3+) (A) Negative   Appearance     Odor       Refer to GYN sooner for uterine prolapse and pessary assessment. Patient already has appointment in September and will attempt to move this appointment up.  Discuss with Dr. Sallee Lange concerns with shoulder tightness - has neuro appointment in Nov. Has appointment with Dr. Wolfgang Phoenix 9/13.  Able to drink water in the office without difficulty. Face is symmetric.    Agrees with plan of care discussed today. Understands warning signs to seek further care: inability to urinate, fever,flank pain, swallowing issues don't return to normal.  Understands to follow-up if symptoms do not improve or if anything changes.  Change GYN appointment for sooner than September.    Chalmers Guest, NP 07/21/2020

## 2020-07-21 NOTE — Patient Instructions (Signed)
You still have a bladder infection Start Bactrim as instructed I think the pressure you are feeling is also related Pelvic Organ Prolapse Pelvic organ prolapse is the stretching, bulging, or dropping of pelvic organs into an abnormal position. It happens when the muscles and tissues that surround and support pelvic structures become weak or stretched. Pelvic organ prolapse can involve the:  Vagina (vaginal prolapse).  Uterus (uterine prolapse).  Bladder (cystocele).  Rectum (rectocele).  Intestines (enterocele). When organs other than the vagina are involved, they often bulge into the vagina or protrude from the vagina, depending on how severe the prolapse is. What are the causes? This condition may be caused by:  Pregnancy, labor, and childbirth.  Past pelvic surgery.  Decreased production of the hormone estrogen associated with menopause.  Consistently lifting more than 50 lb (23 kg).  Obesity.  Long-term inability to pass stool (chronic constipation).  A cough that lasts a long time (chronic).  Buildup of fluid in the abdomen due to certain diseases and other conditions. What are the signs or symptoms? Symptoms of this condition include:  Passing a little urine (loss of bladder control) when you cough, sneeze, strain, and exercise (stress incontinence). This may be worse immediately after childbirth. It may gradually improve over time.  Feeling pressure in your pelvis or vagina. This pressure may increase when you cough or when you are passing stool.  A bulge that protrudes from the opening of your vagina.  Difficulty passing urine or stool.  Pain in your lower back.  Pain, discomfort, or disinterest in sex.  Repeated bladder infections (urinary tract infections).  Difficulty inserting a tampon. In some people, this condition causes no symptoms. How is this diagnosed? This condition may be diagnosed based on a vaginal and rectal exam. During the exam, you may  be asked to cough and strain while you are lying down, sitting, and standing up. Your health care provider will determine if other tests are required, such as bladder function tests. How is this treated? Treatment for this condition may depend on your symptoms. Treatment may include:  Lifestyle changes, such as changes to your diet.  Emptying your bladder at scheduled times (bladder training therapy). This can help reduce or avoid urinary incontinence.  Estrogen. Estrogen may help mild prolapse by increasing the strength and tone of pelvic floor muscles.  Kegel exercises. These may help mild cases of prolapse by strengthening and tightening the muscles of the pelvic floor.  A soft, flexible device that helps support the vaginal walls and keep pelvic organs in place (pessary). This is inserted into your vagina by your health care provider.  Surgery. This is often the only form of treatment for severe prolapse. Follow these instructions at home:  Avoid drinking beverages that contain caffeine or alcohol.  Increase your intake of high-fiber foods. This can help decrease constipation and straining during bowel movements.  Lose weight if recommended by your health care provider.  Wear a sanitary pad or adult diapers if you have urinary incontinence.  Avoid heavy lifting and straining with exercise and work. Do not hold your breath when you perform mild to moderate lifting and exercise activities. Limit your activities as directed by your health care provider.  Do Kegel exercises as directed by your health care provider. To do this: ? Squeeze your pelvic floor muscles tight. You should feel a tight lift in your rectal area and a tightness in your vaginal area. Keep your stomach, buttocks, and legs relaxed. ? Hold  the muscles tight for up to 10 seconds. ? Relax your muscles. ? Repeat this exercise 50 times a day, or as many times as told by your health care provider. Continue to do this  exercise for at least 4-6 weeks, or for as long as told by your health care provider.  Take over-the-counter and prescription medicines only as told by your health care provider.  If you have a pessary, take care of it as told by your health care provider.  Keep all follow-up visits as told by your health care provider. This is important. Contact a health care provider if you:  Have symptoms that interfere with your daily activities or sex life.  Need medicine to help with the discomfort.  Notice bleeding from your vagina that is not related to your period.  Have a fever.  Have pain or bleeding when you urinate.  Have bleeding when you pass stool.  Pass urine when you have sex.  Have chronic constipation.  Have a pessary that falls out.  Have bad smelling vaginal discharge.  Have an unusual, low pain in your abdomen. Summary  Pelvic organ prolapse is the stretching, bulging, or dropping of pelvic organs into an abnormal position. It happens when the muscles and tissues that surround and support pelvic structures become weak or stretched.  When organs other than the vagina are involved, they often bulge into the vagina or protrude from the vagina, depending on how severe the prolapse is.  In most cases, this condition needs to be treated only if it produces symptoms. Treatment may include lifestyle changes, estrogen, Kegel exercises, pessary insertion, or surgery.  Avoid heavy lifting and straining with exercise and work. Do not hold your breath when you perform mild to moderate lifting and exercise activities. Limit your activities as directed by your health care provider. This information is not intended to replace advice given to you by your health care provider. Make sure you discuss any questions you have with your health care provider. Document Revised: 11/29/2017 Document Reviewed: 11/29/2017 Elsevier Patient Education  [employer].  to the uterus prolapse Try  to see GYN sooner than September

## 2020-07-24 ENCOUNTER — Encounter: Payer: Self-pay | Admitting: Obstetrics & Gynecology

## 2020-07-24 ENCOUNTER — Ambulatory Visit: Payer: Medicare Other | Admitting: Obstetrics & Gynecology

## 2020-07-24 VITALS — BP 116/72 | HR 77 | Wt 188.0 lb

## 2020-07-24 DIAGNOSIS — N814 Uterovaginal prolapse, unspecified: Secondary | ICD-10-CM | POA: Diagnosis not present

## 2020-07-24 DIAGNOSIS — Z4689 Encounter for fitting and adjustment of other specified devices: Secondary | ICD-10-CM

## 2020-07-24 NOTE — Progress Notes (Signed)
Chief Complaint  Patient presents with  . Pessary Check    Blood pressure 116/72, pulse 77, weight 188 lb (85.3 kg).  Amanda Osborne presents today for routine follow up related to her pessary.   She uses a Milex ring with support #3 She reports no vaginal discharge or vaginal bleeding.  Exam reveals no undue vaginal mucosal pressure of breakdown, no discharge and no vaginal bleeding.  The pessary is removed, cleaned and replaced without difficulty.      ICD-10-CM   1. Pessary maintenance, Milex ring with support #3, 5/21  Z46.89   2. Uterine prolapse, Grade 2  N81.4      Devanny D Sami will be sen back in 4 months for continued follow up.  Florian Buff, MD  07/24/2020 12:23 PM

## 2020-08-03 ENCOUNTER — Other Ambulatory Visit: Payer: Self-pay

## 2020-08-03 ENCOUNTER — Encounter: Payer: Self-pay | Admitting: Family Medicine

## 2020-08-03 ENCOUNTER — Ambulatory Visit (INDEPENDENT_AMBULATORY_CARE_PROVIDER_SITE_OTHER): Payer: Medicare Other | Admitting: Family Medicine

## 2020-08-03 VITALS — BP 116/72 | HR 78 | Temp 95.5°F | Wt 185.6 lb

## 2020-08-03 DIAGNOSIS — I10 Essential (primary) hypertension: Secondary | ICD-10-CM

## 2020-08-03 DIAGNOSIS — E7849 Other hyperlipidemia: Secondary | ICD-10-CM

## 2020-08-03 DIAGNOSIS — E038 Other specified hypothyroidism: Secondary | ICD-10-CM | POA: Diagnosis not present

## 2020-08-03 DIAGNOSIS — Z79891 Long term (current) use of opiate analgesic: Secondary | ICD-10-CM | POA: Diagnosis not present

## 2020-08-03 MED ORDER — OXYCODONE HCL 5 MG PO TABS: ORAL_TABLET | ORAL | 0 refills | Status: DC

## 2020-08-03 MED ORDER — OXYCODONE HCL 5 MG PO TABS: 5.0000 mg | ORAL_TABLET | ORAL | 0 refills | Status: DC | PRN

## 2020-08-03 MED ORDER — LEVOTHYROXINE SODIUM 25 MCG PO TABS: ORAL_TABLET | ORAL | 1 refills | Status: DC

## 2020-08-03 MED ORDER — LISINOPRIL 2.5 MG PO TABS: 2.5000 mg | ORAL_TABLET | Freq: Every day | ORAL | 1 refills | Status: DC

## 2020-08-03 MED ORDER — HYDROCHLOROTHIAZIDE 25 MG PO TABS: ORAL_TABLET | ORAL | 1 refills | Status: DC

## 2020-08-03 MED ORDER — ALPRAZOLAM 0.5 MG PO TABS: ORAL_TABLET | ORAL | 5 refills | Status: DC

## 2020-08-03 NOTE — Progress Notes (Signed)
Subjective:    Patient ID: Amanda Osborne, female    DOB: 10/12/1951, 69 y.o.   MRN: [id number]  HPI Pt here for follow up. Pt was here on 8/31 for kidney infection but was seen by gyn and that issue has resolved.  Denies any current dysuria urinary frequency Reflux under decent control with medication Blood pressure under good control with current medication watching diet Fall Risk  05/11/2020 03/16/2020 08/12/2019 12/25/2018 12/30/2016  Falls in the past year? 0 0 0 0 No  Number falls in past yr: - - 0 0 -  Injury with Fall? - - 0 0 -  Follow up - Falls evaluation completed Falls evaluation completed Falls evaluation completed -    This patient was seen today for chronic pain  The medication list was reviewed and updated.   -Compliance with medication: Relates compliance with medicine.  - Number patient states they take daily: Takes 6/day.  -when was the last dose patient took?  Earlier today.  The patient was advised the importance of maintaining medication and not using illegal substances with these.  Here for refills and follow up  The patient was educated that we can provide 3 monthly scripts for their medication, it is their responsibility to follow the instructions.  Side effects or complications from medications: Denies any side effects with medicine.  No drowsiness.  Patient is aware that pain medications are meant to minimize the severity of the pain to allow their pain levels to improve to allow for better function. They are aware of that pain medications cannot totally remove their pain.  Due for UDT ( at least once per year) : Gets this on a yearly basis.  Scale of 1 to 10 ( 1 is least 10 is most) Your pain level without the medicine: Patient relates pain level without the medicine is approximately 7 Your pain level with medication with the medicine four  Scale 1 to 10 ( 1-helps very little, 10 helps very well) How well does your pain medication reduce your pain  so you can function better through out the day?  8      Blood pressure doing ok as far as patient is aware.  Encounter for long-term opiate analgesic use  HTN (hypertension), benign  Other specified hypothyroidism  Other hyperlipidemia    Review of Systems  Constitutional: Negative for activity change and appetite change.  HENT: Negative for congestion and rhinorrhea.   Respiratory: Negative for cough and shortness of breath.   Cardiovascular: Negative for chest pain and leg swelling.  Gastrointestinal: Negative for abdominal pain, nausea and vomiting.  Skin: Negative for color change.  Neurological: Negative for dizziness and weakness.  Psychiatric/Behavioral: Negative for agitation and confusion.       Objective:   Physical Exam Vitals reviewed.  Constitutional:      General: She is not in acute distress. HENT:     Head: Normocephalic and atraumatic.  Eyes:     General:        Right eye: No discharge.        Left eye: No discharge.  Neck:     Trachea: No tracheal deviation.  Cardiovascular:     Rate and Rhythm: Normal rate and regular rhythm.     Heart sounds: Normal heart sounds. No murmur heard.   Pulmonary:     Effort: Pulmonary effort is normal. No respiratory distress.     Breath sounds: Normal breath sounds.  Lymphadenopathy:     Cervical:  No cervical adenopathy.  Skin:    General: Skin is warm and dry.  Neurological:     Mental Status: She is alert.     Coordination: Coordination normal.  Psychiatric:        Behavior: Behavior normal.     Results for orders placed or performed in visit on 07/21/20  POCT Glucose (Device for Home Use)  Result Value Ref Range   Glucose Fasting, POC 109 (A) 70 - 99 mg/dL   POC Glucose    POCT Urinalysis Dipstick  Result Value Ref Range   Color, UA     Clarity, UA     Glucose, UA     Bilirubin, UA     Ketones, UA     Spec Grav, UA 1.015 1.010 - 1.025   Blood, UA     pH, UA 5.0 5.0 - 8.0   Protein, UA      Urobilinogen, UA     Nitrite, UA     Leukocytes, UA Large (3+) (A) Negative   Appearance     Odor           Assessment & Plan:  1. HTN (hypertension), benign Blood pressure good control continue current measures  2. Other specified hypothyroidism Takes her thyroid medicine regular basis Patient had recent thyroid testing and did well with that except TSH slightly elevated therefore we increased the dosage to 8 tablets once weekly 3. Other hyperlipidemia Cholesterols been under decent control can only tolerate 20 mg pravastatin continue this currently  4. Encounter for long-term opiate analgesic use The patient was seen in followup for chronic pain. A review over at their current pain status was discussed. Drug registry was checked. Prescriptions were given.  Regular follow-up recommended. Discussion was held regarding the importance of compliance with medication as well as pain medication contract.  Patient was informed that medication may cause drowsiness and should not be combined  with other medications/alcohol or street drugs. If the patient feels medication is causing altered alertness then do not drive or operate dangerous equipment.  Drug registry was checked 3 prescription sent in  She has chronic longstanding anxiety has been on Xanax for years unfortunately has not done well will try to taper off of this currently she is adjusted with this with her pain medicine so therefore we will continue her Xanax she has been cautioned as she feels drowsy not to drive  Virtual visit in 3 months

## 2020-08-04 ENCOUNTER — Other Ambulatory Visit (INDEPENDENT_AMBULATORY_CARE_PROVIDER_SITE_OTHER): Payer: Self-pay | Admitting: Gastroenterology

## 2020-08-04 ENCOUNTER — Telehealth (INDEPENDENT_AMBULATORY_CARE_PROVIDER_SITE_OTHER): Payer: Self-pay | Admitting: *Deleted

## 2020-08-04 NOTE — Telephone Encounter (Signed)
You saw patient back in January 2021 - she didn't have anyone to bring her to procedure so I couldn't schedule then - she has called today and her son will be able to bring her 11/11/20 - is it ok to go ahead and schedule - if so can you put order in - it will need to be with propofol due to date she can come. thanks

## 2020-08-04 NOTE — Progress Notes (Signed)
Left message to return call 

## 2020-08-04 NOTE — Telephone Encounter (Signed)
Orders for surgery completed in orders only encounter. Thanks!

## 2020-08-04 NOTE — Progress Notes (Signed)
Orders for colonoscopy.

## 2020-08-05 ENCOUNTER — Other Ambulatory Visit: Payer: Self-pay

## 2020-08-05 ENCOUNTER — Other Ambulatory Visit: Payer: Medicare Other | Admitting: *Deleted

## 2020-08-05 DIAGNOSIS — Z79891 Long term (current) use of opiate analgesic: Secondary | ICD-10-CM | POA: Diagnosis not present

## 2020-08-05 LAB — MED LIST OPTION NOT SELECTED

## 2020-08-05 NOTE — Progress Notes (Signed)
Pt came in for a nurse visit today to give urine for yearly drug screen. Urine sent to lab.

## 2020-08-06 ENCOUNTER — Other Ambulatory Visit: Payer: Self-pay | Admitting: Family Medicine

## 2020-08-07 LAB — SPECIMEN STATUS REPORT

## 2020-08-07 LAB — TOXASSURE SELECT 13 (MW), URINE

## 2020-08-11 ENCOUNTER — Ambulatory Visit: Payer: Medicare Other | Admitting: Obstetrics & Gynecology

## 2020-08-17 ENCOUNTER — Encounter: Payer: Self-pay | Admitting: Orthopedic Surgery

## 2020-08-17 ENCOUNTER — Ambulatory Visit (INDEPENDENT_AMBULATORY_CARE_PROVIDER_SITE_OTHER): Payer: Medicare Other | Admitting: Orthopedic Surgery

## 2020-08-17 ENCOUNTER — Other Ambulatory Visit: Payer: Self-pay

## 2020-08-17 VITALS — BP 118/67 | HR 85 | Ht 66.5 in

## 2020-08-17 DIAGNOSIS — M1711 Unilateral primary osteoarthritis, right knee: Secondary | ICD-10-CM

## 2020-08-17 DIAGNOSIS — M1712 Unilateral primary osteoarthritis, left knee: Secondary | ICD-10-CM | POA: Diagnosis not present

## 2020-08-17 NOTE — Progress Notes (Signed)
Chief Complaint  Patient presents with  . Knee Pain    bilateral knee pain, requesting injections    Encounter Diagnoses  Name Primary?  . Primary osteoarthritis of left knee Yes  . Arthritis of knee, right     Procedure note for bilateral knee injections  Procedure note left knee injection verbal consent was obtained to inject left knee joint  Timeout was completed to confirm the site of injection  The medications used were 40 mg of Depo-Medrol and 1% lidocaine 3 cc  Anesthesia was provided by ethyl chloride and the skin was prepped with alcohol.  After cleaning the skin with alcohol a 20-gauge needle was used to inject the left knee joint. There were no complications. A sterile bandage was applied.   Procedure note right knee injection verbal consent was obtained to inject right knee joint  Timeout was completed to confirm the site of injection  The medications used were 40 mg of Depo-Medrol and 1% lidocaine 3 cc  Anesthesia was provided by ethyl chloride and the skin was prepped with alcohol.  After cleaning the skin with alcohol a 20-gauge needle was used to inject the right knee joint. There were no complications. A sterile bandage was applied.

## 2020-08-17 NOTE — Patient Instructions (Signed)

## 2020-08-18 ENCOUNTER — Ambulatory Visit: Payer: Medicare Other | Admitting: Obstetrics & Gynecology

## 2020-09-04 ENCOUNTER — Telehealth: Payer: Medicare Other | Admitting: Emergency Medicine

## 2020-09-04 DIAGNOSIS — M549 Dorsalgia, unspecified: Secondary | ICD-10-CM

## 2020-09-04 MED ORDER — PREDNISONE 20 MG PO TABS
40.0000 mg | ORAL_TABLET | Freq: Every day | ORAL | 0 refills | Status: DC
Start: 2020-09-04 — End: 2020-09-30

## 2020-09-04 NOTE — Progress Notes (Signed)
We are sorry that you are not feeling well.  Here is how we plan to help!  Based on what you have shared with me it looks like you mostly have acute back pain.  Acute back pain is defined as musculoskeletal pain that can resolve in 1-3 weeks with conservative treatment.  I have prescribed Prednisone 5 day burst. Some patients experience stomach irritation or in increased heartburn with anti-inflammatory drugs.  Please keep in mind that muscle relaxer's can cause fatigue and should not be taken while at work or driving.  Back pain is very common.  The pain often gets better over time.  The cause of back pain is usually not dangerous.  Most people can learn to manage their back pain on their own.  Home Care  Stay active.  Start with short walks on flat ground if you can.  Try to walk farther each day.  Do not sit, drive or stand in one place for more than 30 minutes.  Do not stay in bed.  Do not avoid exercise or work.  Activity can help your back heal faster.  Be careful when you bend or lift an object.  Bend at your knees, keep the object close to you, and do not twist.  Sleep on a firm mattress.  Lie on your side, and bend your knees.  If you lie on your back, put a pillow under your knees.  Only take medicines as told by your doctor.  Put ice on the injured area.  Put ice in a plastic bag  Place a towel between your skin and the bag  Leave the ice on for 15-20 minutes, 3-4 times a day for the first 2-3 days. 210 After that, you can switch between ice and heat packs.  Ask your doctor about back exercises or massage.  Avoid feeling anxious or stressed.  Find good ways to deal with stress, such as exercise.  Get Help Right Way If:  Your pain does not go away with rest or medicine.  Your pain does not go away in 1 week.  You have new problems.  You do not feel well.  The pain spreads into your legs.  You cannot control when you poop (bowel movement) or pee (urinate)  You  feel sick to your stomach (nauseous) or throw up (vomit)  You have belly (abdominal) pain.  You feel like you may pass out (faint).  If you develop a fever.  Make Sure you:  Understand these instructions.  Will watch your condition  Will get help right away if you are not doing well or get worse.  Your e-visit answers were reviewed by a board certified advanced clinical practitioner to complete your personal care plan.  Depending on the condition, your plan could have included both over the counter or prescription medications.  If there is a problem please reply  once you have received a response from your provider.  Your safety is important to [place].  If you have drug allergies check your prescription carefully.    You can use MyChart to ask questions about today's visit, request a non-urgent call back, or ask for a work or school excuse for 24 hours related to this e-Visit. If it has been greater than 24 hours you will need to follow up with your provider, or enter a new e-Visit to address those concerns.  You will get an e-mail in the next two days asking about your experience.  I hope that your  e-visit has been valuable and will speed your recovery. Thank you for using e-visits.  **Please do not respond to this message unless you have follow up questions.** Greater than 5 but less than 10 minutes spent researching, coordinating, and implementing care for this patient today

## 2020-09-22 ENCOUNTER — Other Ambulatory Visit: Payer: Self-pay | Admitting: Family Medicine

## 2020-09-23 NOTE — Telephone Encounter (Signed)
05/08/20 last med check up

## 2020-09-30 ENCOUNTER — Encounter: Payer: Self-pay | Admitting: Neurology

## 2020-09-30 ENCOUNTER — Ambulatory Visit: Payer: Medicare Other | Admitting: Neurology

## 2020-09-30 VITALS — BP 145/70 | HR 75 | Ht 66.5 in | Wt 185.5 lb

## 2020-09-30 DIAGNOSIS — G4486 Cervicogenic headache: Secondary | ICD-10-CM

## 2020-09-30 MED ORDER — PREDNISONE 10 MG PO TABS
ORAL_TABLET | ORAL | 0 refills | Status: DC
Start: 2020-09-30 — End: 2020-10-19

## 2020-09-30 MED ORDER — TRIAMCINOLONE ACETONIDE 0.1 % EX CREA
1.0000 | TOPICAL_CREAM | Freq: Every day | CUTANEOUS | Status: DC | PRN
Start: 2020-09-30 — End: 2020-10-19

## 2020-09-30 MED ORDER — TIZANIDINE HCL 4 MG PO TABS: ORAL_TABLET | ORAL | 2 refills | Status: DC

## 2020-09-30 MED ORDER — NORTRIPTYLINE HCL 10 MG PO CAPS: 10.0000 mg | ORAL_CAPSULE | Freq: Every day | ORAL | 3 refills | Status: DC

## 2020-09-30 NOTE — Patient Instructions (Signed)
We will go up on the tizanidine to 1/2 tablet three times during the day and one tablet at night

## 2020-09-30 NOTE — Progress Notes (Signed)
Reason for visit: Cervicogenic headache  Amanda Osborne is an 69 y.o. female  History of present illness:  Amanda Osborne is a 69 year old right-handed white female with a history of cervical spine surgery at the C6-7 level and then later at the C5-6 level.  The patient has had chronic neck pain since that time, about 3 times a year she will have severe spasms involving the neck and only thing that seems to help her is a 6-day course of prednisone.  The patient indicates that stretching exercises worsens the pain.  She tends to have a trigger point in the left upper shoulder or base of the neck.  The pain is worse at the skull base.  When last seen through this office, she was placed on nortriptyline, she only took 10 mg at night, never went up on the dose.  The 10 mg dosing completed eliminated her muscle tension headaches, but she continues to have neck pain.  The patient has had EMG and nerve conduction study that showed evidence of a mild left ulnar neuropathy and mild left carpal tunnel syndrome, occasionally she will get tingly sensations into the left hand.  The patient takes oxycodone 5 mg, she takes 1 tablet every 4 hours.  She is on low-dose tizanidine, only taking 4 mg at night, she does not take the medication during the day.  The patient has been seen through pain management in the past, trigger point injections seem to help.  Botox therapy in the past did not help her.  The patient returns for further evaluation.  Past Medical History:  Diagnosis Date  . Anxiety   . Arthritis   . Cervical disc herniation    c5 and c6  . Cervicogenic headache 05/13/2019  . Chronic pain   . Complication of anesthesia   . Fibromyalgia   . GERD (gastroesophageal reflux disease)   . Headache(784.0)    Hx: of migraines until age 81's  . Hyperlipidemia   . Hypertension   . Iron deficiency anemia 11/30/2012  . Left carpal tunnel syndrome 11/07/2018  . Narcolepsy   . Occipital neuralgia   . PONV  (postoperative nausea and vomiting)    DURING FIRST NECK SURGERY ; BUT REPORT IT MAY HAVE BEEN THE DILAUDID THAT CAUSED THE VOITING I  . Unspecified vitamin D deficiency 03/26/2013  . Wrist fracture, bilateral 2005 and 2010   right then left    Past Surgical History:  Procedure Laterality Date  . ANTERIOR CERVICAL DECOMP/DISCECTOMY FUSION  2000   C6-C7 DR Ellene Route   . ANTERIOR CERVICAL DECOMP/DISCECTOMY FUSION N/A 08/05/2013   Procedure: Cervical five-six Anterior cervical decompression/diskectomy/fusion/ Synthes plate removal;  Surgeon: Kristeen Miss, MD;  Location: [facility];  Service: Neurosurgery;  Laterality: N/A;  . BONE GRAFT HIP ILIAC CREST  2006   to right wrist to correct fracture, DR. [place] [facility]TH[facility]   . COLONOSCOPY     Hx; of  . DILATION AND CURETTAGE OF UTERUS    . ESOPHAGEAL DILATION N/A 10/22/2015   Procedure: ESOPHAGEAL DILATION;  Surgeon: Rogene Houston, MD;  Location: [facility];  Service: Endoscopy;  Laterality: N/A;  . ESOPHAGOGASTRODUODENOSCOPY N/A 08/28/2015   Procedure: ESOPHAGOGASTRODUODENOSCOPY (EGD);  Surgeon: Rogene Houston, MD;  Location: [facility];  Service: Endoscopy;  Laterality: N/A;  910am  . ESOPHAGOGASTRODUODENOSCOPY N/A 10/22/2015   Procedure: ESOPHAGOGASTRODUODENOSCOPY (EGD);  Surgeon: Rogene Houston, MD;  Location: [facility];  Service: Endoscopy;  Laterality: N/A;  12:00  .  FRACTURE SURGERY     Hx: of right wrist surgery  . GASTRIC BYPASS  1981  . INCISIONAL HERNIA REPAIR N/A 06/14/2018   Procedure: LAPAROSCOPIC INCISIONAL HERNIA REPAIR WITH INSERTION OF MESH ERAS PATHWAY;  Surgeon: Kinsinger, Arta Bruce, MD;  Location: [facility];  Service: General;  Laterality: N/A;  . TONSILLECTOMY      Family History  Problem Relation Age of Onset  . Stroke Mother   . Heart disease Mother   . Diabetes Mother   . Heart disease Father   . Cancer - Colon Father   . Other Brother     Social history:  reports that she has never smoked. She has never  used smokeless tobacco. She reports that she does not drink alcohol and does not use drugs.    Allergies  Allergen Reactions  . Topamax [Topiramate] Anaphylaxis    Memory issues  . Celexa [Citalopram Hydrobromide] Nausea And Vomiting  . Fosamax [Alendronate Sodium]     Esophagitis   . Neurontin [Gabapentin]     Drowsy   . Soma [Carisoprodol]     dizzy    Medications:  Prior to Admission medications   Medication Sig Start Date End Date Taking? Authorizing Provider  ALPRAZolam Duanne Moron) 0.5 MG tablet TAKE 1 TABLET BY MOUTH TWICE DAILY AS NEEDED FOR ANXIETY 08/03/20  Yes Luking, Scott A, MD  esomeprazole (NEXIUM) 20 MG capsule Take 20 mg by mouth daily.    Yes [provider]  famotidine (PEPCID) 20 MG tablet TAKE 1 TABLET(20 MG) BY MOUTH TWICE DAILY 03/17/20  Yes Luking, Scott A, MD  fluticasone (FLONASE) 50 MCG/ACT nasal spray INSTILL 2 SPRAYS INTO EACH NOSTRIL ONCE DAILY 03/21/19  Yes Luking, Scott A, MD  hydrochlorothiazide (HYDRODIURIL) 25 MG tablet TAKE 1 TABLET(25 MG) BY MOUTH DAILY 08/03/20  Yes Kathyrn Drown, MD  levothyroxine (SYNTHROID) 25 MCG tablet Take 8 tablets po each Thursday 08/03/20  Yes Luking, Scott A, MD  lisinopril (ZESTRIL) 2.5 MG tablet Take 1 tablet (2.5 mg total) by mouth daily. 08/03/20  Yes Kathyrn Drown, MD  loratadine (CLARITIN) 10 MG tablet Take 10 mg by mouth daily as needed for allergies.    Yes [provider]  nortriptyline (PAMELOR) 10 MG capsule Take one capsule at night for 2 weeks, then take 2 capsules at night 03/25/20  Yes Kathrynn Ducking, MD  oxyCODONE (OXY IR/ROXICODONE) 5 MG immediate release tablet Take 1 tablet (5 mg total) by mouth every 4 (four) hours as needed for severe pain. 08/03/20  Yes Kathyrn Drown, MD  oxyCODONE (ROXICODONE) 5 MG immediate release tablet Take 1 tablet (5 mg total) by mouth every 4 (four) hours as needed for severe pain 08/03/20  Yes Luking, Elayne Snare, MD  oxyCODONE (ROXICODONE) 5 MG immediate release  tablet Take 1 tablet (5 mg total) by mouth every 4 (four) hours as needed for severe pain 08/03/20  Yes Luking, Scott A, MD  polyethylene glycol powder (GLYCOLAX/MIRALAX) powder Take 17 g by mouth daily.    Yes [provider]  pravastatin (PRAVACHOL) 20 MG tablet TAKE 1 TABLET DAILY 05/08/20  Yes Luking, Scott A, MD  predniSONE (DELTASONE) 20 MG tablet Take 2 tablets (40 mg total) by mouth daily. 09/04/20  Yes Harris, Abigail, PA-C  sulfamethoxazole-trimethoprim (BACTRIM) 400-80 MG tablet Take 1 tablet by mouth 2 (two) times daily. 07/21/20  Yes Chalmers Guest, NP  tiZANidine (ZANAFLEX) 4 MG tablet TAKE 1/2 TO 1 TABLET BY MOUTH EVERY NIGHT AS  NEEDED FOR MUSCLE SPASMS 09/23/20  Yes Kathyrn Drown, MD  tobramycin-dexamethasone [facility]) ophthalmic solution Place 1 drop into the right eye 4 (four) times daily. Up to 5 days 08/10/18  Yes Luking, Elayne Snare, MD  triamcinolone cream (KENALOG) 0.1 % Apply 1 application topically 2 (two) times daily. Patient taking differently: Apply 1 application topically daily as needed (rash).  01/08/16  Yes Kathyrn Drown, MD  UNABLE TO [place] cream-prn   Yes [provider]    ROS:  Out of a complete 14 system review of symptoms, the patient complains only of the following symptoms, and all other reviewed systems are negative.  Neck pain, muscle spasm Headache  Blood pressure (!) 145/70, pulse 75, height 5' 6.5" (1.689 m), weight 185 lb 8 oz (84.1 kg).  Physical Exam  General: The patient is alert and cooperative at the time of the examination.  Neuromuscular: The patient has fairly good range of movement of the cervical spine rotating to the right, but the patient is restricted 35 degrees when turning to the left.  Skin: No significant peripheral edema is noted.   Neurologic Exam  Mental status: The patient is alert and oriented x 3 at the time of the examination. The patient has apparent normal recent and remote memory, with an  apparently normal attention span and concentration ability.   Cranial nerves: Facial symmetry is present. Speech is normal, no aphasia or dysarthria is noted. Extraocular movements are full. Visual fields are full.  Motor: The patient has good strength in all 4 extremities.  Sensory examination: Soft touch sensation is symmetric on the face, arms, and legs.  Coordination: The patient has good finger-nose-finger and heel-to-shin bilaterally.  Gait and station: The patient has a normal gait. Tandem gait is normal. Romberg is negative. No drift is seen.  Reflexes: Deep tendon reflexes are symmetric.   Assessment/Plan:  1.  History of cervical spine surgery, chronic neck pain  2.  Cervicogenic headache  The patient is having ongoing daily neck pain, she is on chronic opiate therapy.  The patient will go up on her tizanidine dose as this is a short acting drug.  She will take 2 mg 3 times during the day and 4 mg at night.  She will be given a prednisone Dosepak to take when she gets into another episode of muscle spasm.  The patient has done well with a very low dose of nortriptyline, she will continue to 10 mg at night and a prescription was sent in for this.  She will follow up here in 6 months.  She claims that neuromuscular therapy with stretching actually worsens her discomfort.  Jill Alexanders MD 09/30/2020 8:00 AM  [facility] [address] [place], [place] [address]  Phone [phone] Fax [phone]

## 2020-10-02 ENCOUNTER — Telehealth (INDEPENDENT_AMBULATORY_CARE_PROVIDER_SITE_OTHER): Payer: Self-pay | Admitting: *Deleted

## 2020-10-02 ENCOUNTER — Encounter (INDEPENDENT_AMBULATORY_CARE_PROVIDER_SITE_OTHER): Payer: Self-pay | Admitting: *Deleted

## 2020-10-02 NOTE — Telephone Encounter (Signed)
Patient needs Plenvu (copay card) ° °

## 2020-10-05 MED ORDER — PLENVU 140 G PO SOLR
1.0000 | Freq: Once | ORAL | 0 refills | Status: AC
Start: 2020-10-05 — End: 2020-10-05

## 2020-10-14 ENCOUNTER — Other Ambulatory Visit: Payer: Self-pay | Admitting: Family Medicine

## 2020-10-18 ENCOUNTER — Other Ambulatory Visit: Payer: Self-pay

## 2020-10-18 ENCOUNTER — Observation Stay (HOSPITAL_COMMUNITY)
Admission: EM | Admit: 2020-10-18 | Discharge: 2020-10-19 | Disposition: A | Discharge status: Home / Self Care | Payer: Medicare Other | Attending: Orthopedic Surgery | Admitting: Orthopedic Surgery

## 2020-10-18 ENCOUNTER — Emergency Department (HOSPITAL_COMMUNITY): Payer: Medicare Other

## 2020-10-18 ENCOUNTER — Encounter (HOSPITAL_COMMUNITY): Payer: Self-pay | Admitting: Emergency Medicine

## 2020-10-18 ENCOUNTER — Ambulatory Visit
Admission: EM | Admit: 2020-10-18 | Discharge: 2020-10-18 | Disposition: A | Discharge status: Home / Self Care | Payer: Medicare Other

## 2020-10-18 DIAGNOSIS — M549 Dorsalgia, unspecified: Secondary | ICD-10-CM | POA: Diagnosis not present

## 2020-10-18 DIAGNOSIS — E871 Hypo-osmolality and hyponatremia: Secondary | ICD-10-CM | POA: Diagnosis not present

## 2020-10-18 DIAGNOSIS — I10 Essential (primary) hypertension: Secondary | ICD-10-CM | POA: Diagnosis not present

## 2020-10-18 DIAGNOSIS — R9431 Abnormal electrocardiogram [ECG] [EKG]: Secondary | ICD-10-CM

## 2020-10-18 DIAGNOSIS — E785 Hyperlipidemia, unspecified: Secondary | ICD-10-CM | POA: Diagnosis not present

## 2020-10-18 DIAGNOSIS — M797 Fibromyalgia: Secondary | ICD-10-CM | POA: Diagnosis not present

## 2020-10-18 DIAGNOSIS — M1990 Unspecified osteoarthritis, unspecified site: Secondary | ICD-10-CM | POA: Diagnosis not present

## 2020-10-18 DIAGNOSIS — E7849 Other hyperlipidemia: Secondary | ICD-10-CM | POA: Diagnosis not present

## 2020-10-18 DIAGNOSIS — I309 Acute pericarditis, unspecified: Principal | ICD-10-CM

## 2020-10-18 DIAGNOSIS — R079 Chest pain, unspecified: Secondary | ICD-10-CM | POA: Diagnosis not present

## 2020-10-18 DIAGNOSIS — Z743 Need for continuous supervision: Secondary | ICD-10-CM | POA: Diagnosis not present

## 2020-10-18 DIAGNOSIS — Z20822 Contact with and (suspected) exposure to covid-19: Secondary | ICD-10-CM | POA: Diagnosis not present

## 2020-10-18 DIAGNOSIS — K219 Gastro-esophageal reflux disease without esophagitis: Secondary | ICD-10-CM | POA: Diagnosis not present

## 2020-10-18 DIAGNOSIS — R072 Precordial pain: Secondary | ICD-10-CM | POA: Diagnosis not present

## 2020-10-18 DIAGNOSIS — R0789 Other chest pain: Secondary | ICD-10-CM | POA: Diagnosis not present

## 2020-10-18 DIAGNOSIS — I499 Cardiac arrhythmia, unspecified: Secondary | ICD-10-CM | POA: Diagnosis not present

## 2020-10-18 DIAGNOSIS — R739 Hyperglycemia, unspecified: Secondary | ICD-10-CM | POA: Diagnosis not present

## 2020-10-18 DIAGNOSIS — Z79899 Other long term (current) drug therapy: Secondary | ICD-10-CM | POA: Diagnosis not present

## 2020-10-18 LAB — CBC
HCT: 42.9 % (ref 36.0–46.0)
HCT: 42.2 % (ref 36.0–46.0)
Hemoglobin: 13.6 g/dL (ref 12.0–15.0)
Hemoglobin: 13.3 g/dL (ref 12.0–15.0)
MCH: 26.9 pg (ref 26.0–34.0)
MCH: 26.8 pg (ref 26.0–34.0)
MCHC: 31.7 g/dL (ref 30.0–36.0)
MCHC: 31.5 g/dL (ref 30.0–36.0)
MCV: 85 fL (ref 80.0–100.0)
MCV: 85.1 fL (ref 80.0–100.0)
Platelets: 356 10*3/uL (ref 150–400)
Platelets: 312 10*3/uL (ref 150–400)
RBC: 5.05 MIL/uL (ref 3.87–5.11)
RBC: 4.96 MIL/uL (ref 3.87–5.11)
RDW: 14.5 % (ref 11.5–15.5)
RDW: 14.5 % (ref 11.5–15.5)
WBC: 9.1 10*3/uL (ref 4.0–10.5)
WBC: 7.6 10*3/uL (ref 4.0–10.5)
nRBC: 0 % (ref 0.0–0.2)
nRBC: 0 % (ref 0.0–0.2)

## 2020-10-18 LAB — CREATININE, SERUM
Creatinine, Ser: 0.48 mg/dL (ref 0.44–1.00)
GFR, Estimated: >60 mL/min (ref >60)

## 2020-10-18 LAB — BASIC METABOLIC PANEL
Anion gap: 10 (ref 5–15)
BUN: 21 mg/dL (ref 8–23)
CO2: 30 mmol/L (ref 22–32)
Calcium: 9.5 mg/dL (ref 8.9–10.3)
Chloride: 91 mmol/L — ABNORMAL LOW (ref 98–111)
Creatinine, Ser: 0.58 mg/dL (ref 0.44–1.00)
GFR, Estimated: >60 mL/min (ref >60)
Glucose, Bld: 110 mg/dL — ABNORMAL HIGH (ref 70–99)
Potassium: 3.2 mmol/L — ABNORMAL LOW (ref 3.5–5.1)
Sodium: 131 mmol/L — ABNORMAL LOW (ref 135–145)

## 2020-10-18 LAB — RESP PANEL BY RT-PCR (FLU A&B, COVID) ARPGX2
Influenza A by PCR: NEGATIVE
Influenza B by PCR: NEGATIVE
SARS Coronavirus 2 by RT PCR: NEGATIVE

## 2020-10-18 LAB — TROPONIN I (HIGH SENSITIVITY)
Troponin I (High Sensitivity): 6 ng/L (ref <18)
Troponin I (High Sensitivity): 6 ng/L (ref <18)

## 2020-10-18 LAB — C-REACTIVE PROTEIN: CRP: 17 mg/dL — ABNORMAL HIGH (ref <1.0)

## 2020-10-18 MED ORDER — PANTOPRAZOLE SODIUM 40 MG PO TBEC
40.0000 mg | DELAYED_RELEASE_TABLET | Freq: Every day | ORAL | Status: DC
  Administered 2020-10-19: 40 mg via ORAL
  Filled 2020-10-18 (×2): qty 1

## 2020-10-18 MED ORDER — INDOMETHACIN 25 MG PO CAPS
50.0000 mg | ORAL_CAPSULE | Freq: Two times a day (BID) | ORAL | Status: DC
  Administered 2020-10-19: 50 mg via ORAL
  Filled 2020-10-18 (×2): qty 2

## 2020-10-18 MED ORDER — NORTRIPTYLINE HCL 10 MG PO CAPS
10.0000 mg | ORAL_CAPSULE | Freq: Every day | ORAL | Status: DC
  Administered 2020-10-18: 10 mg via ORAL
  Filled 2020-10-18 (×2): qty 1

## 2020-10-18 MED ORDER — POTASSIUM CHLORIDE 20 MEQ PO PACK
40.0000 meq | PACK | Freq: Two times a day (BID) | ORAL | Status: DC
  Administered 2020-10-18 – 2020-10-19 (×2): 40 meq via ORAL
  Filled 2020-10-18 (×2): qty 2

## 2020-10-18 MED ORDER — ACETAMINOPHEN 500 MG PO TABS
1000.0000 mg | ORAL_TABLET | Freq: Once | ORAL | Status: AC
  Administered 2020-10-18: 1000 mg via ORAL
  Filled 2020-10-18: qty 2

## 2020-10-18 MED ORDER — ALUM & MAG HYDROXIDE-SIMETH 200-200-20 MG/5ML PO SUSP
30.0000 mL | Freq: Once | ORAL | Status: AC
  Administered 2020-10-18: 30 mL via ORAL
  Filled 2020-10-18: qty 30

## 2020-10-18 MED ORDER — LIDOCAINE VISCOUS HCL 2 % MT SOLN
15.0000 mL | Freq: Once | OROMUCOSAL | Status: AC
  Administered 2020-10-18: 15 mL via ORAL
  Filled 2020-10-18: qty 15

## 2020-10-18 MED ORDER — HEPARIN SODIUM (PORCINE) 5000 UNIT/ML IJ SOLN
5000.0000 [IU] | Freq: Three times a day (TID) | INTRAMUSCULAR | Status: DC
  Administered 2020-10-18 – 2020-10-19 (×2): 5000 [IU] via SUBCUTANEOUS
  Filled 2020-10-18 (×2): qty 1

## 2020-10-18 MED ORDER — COLCHICINE 0.6 MG PO TABS
0.6000 mg | ORAL_TABLET | Freq: Every day | ORAL | Status: DC
  Administered 2020-10-18 – 2020-10-19 (×2): 0.6 mg via ORAL
  Filled 2020-10-18 (×2): qty 1

## 2020-10-18 NOTE — ED Triage Notes (Addendum)
Pt presents to ED BIB [facility] from [facility]. EKG STEMI at [facility], paged to cards and says no STEMI. UC given 4 - 81 mg ASA. Pt states pain is alleviated. Pt reports that she has had flare ups of this same pain for years  HR - 100 118/70 CBG - 133

## 2020-10-18 NOTE — ED Provider Notes (Signed)
[facility] EMERGENCY DEPARTMENT Provider Note   CSN: [id number] Arrival date & time: 10/18/20  1748     History Chief Complaint  Patient presents with  . Chest Pain    Kaneesha Constantino Sather is a 69 y.o. female with a PMHx occipital neuralgia, cervicogenic headache, fibromyalgia, migraines, hyperlipidemia, and hypertension presents with chest pain.  Patient states that she has trigger points and 4 months ago had a left shoulder trigger point (where she has had pain before) that worsened.  She was seen by her neurologist and prescribed prednisone which usually helps.  However, she stated that in the past couple days, it started radiating to her between her shoulder blades with which new and then last night the sensation spread to her chest.  There are radiation to between her shoulder blades in her chest was new for her trigger point pain which concerned her prompting her to present to an urgent care in order to obtain another prescription for prednisone.  However, at the urgent care they were concerned for STEMI given diffuse ST elevations on EKG and activated her as a code STEMI but cardiology did not feel her EKG was consistent with this.  The history is provided by the patient.  Chest Pain Chest pain location: across entire chest. Pain quality: burning   Radiates to: started in shoulders, then moved to between her shoulder blades, now radiates to chest. Pain severity:  Moderate Onset quality:  Gradual Duration:  1 day Timing:  Constant Progression:  Worsening Chronicity:  New Relieved by:  Nothing Worsened by:  Nothing Associated symptoms: back pain, fatigue and headache   Associated symptoms: no abdominal pain, no cough, no diaphoresis, no fever, no heartburn, no lower extremity edema, no nausea, no numbness, no palpitations, no shortness of breath and no vomiting        Past Medical History:  Diagnosis Date  . Anxiety   . Arthritis   . Cervical disc  herniation    c5 and c6  . Cervicogenic headache 05/13/2019  . Chronic pain   . Complication of anesthesia   . Fibromyalgia   . GERD (gastroesophageal reflux disease)   . Headache(784.0)    Hx: of migraines until age 53's  . Hyperlipidemia   . Hypertension   . Iron deficiency anemia 11/30/2012  . Left carpal tunnel syndrome 11/07/2018  . Narcolepsy   . Occipital neuralgia   . PONV (postoperative nausea and vomiting)    DURING FIRST NECK SURGERY ; BUT REPORT IT MAY HAVE BEEN THE DILAUDID THAT CAUSED THE VOITING I  . Unspecified vitamin D deficiency 03/26/2013  . Wrist fracture, bilateral 2005 and 2010   right then left    Patient Active Problem List   Diagnosis Date Noted  . Precordial chest pain 10/18/2020  . Dysuria 07/21/2020  . Cystitis 07/21/2020  . Cervicogenic headache 05/13/2019  . Left carpal tunnel syndrome 11/07/2018  . Incisional hernia 06/14/2018  . Hypothyroid 02/27/2018  . IDA (iron deficiency anemia) 08/10/2017  . GERD (gastroesophageal reflux disease) 05/01/2015  . Osteoporosis 12/22/2014  . Occipital neuralgia 09/16/2014  . Spasmodic torticollis 09/16/2014  . HTN (hypertension), benign 03/17/2014  . Thyroid nodule 03/17/2014  . Right ankle pain 12/20/2013  . Spondylosis, cervical, with myelopathy C5-6 08/06/2013  . Kyphosis 05/02/2013  . Hyperlipidemia 05/02/2013  . Chronic back pain 03/29/2013  . Generalized anxiety disorder 03/29/2013  . Depression with anxiety 03/29/2013  . H/O gastric bypass 03/29/2013  . Unspecified vitamin D deficiency 03/26/2013  .  Iron deficiency anemia 11/30/2012    Past Surgical History:  Procedure Laterality Date  . ANTERIOR CERVICAL DECOMP/DISCECTOMY FUSION  2000   C6-C7 DR Ellene Route   . ANTERIOR CERVICAL DECOMP/DISCECTOMY FUSION N/A 08/05/2013   Procedure: Cervical five-six Anterior cervical decompression/diskectomy/fusion/ Synthes plate removal;  Surgeon: Kristeen Miss, MD;  Location: [facility];  Service: Neurosurgery;   Laterality: N/A;  . BONE GRAFT HIP ILIAC CREST  2006   to right wrist to correct fracture, DR. Hardy ORTHO   . COLONOSCOPY     Hx; of  . DILATION AND CURETTAGE OF UTERUS    . ESOPHAGEAL DILATION N/A 10/22/2015   Procedure: ESOPHAGEAL DILATION;  Surgeon: Rogene Houston, MD;  Location: [facility];  Service: Endoscopy;  Laterality: N/A;  . ESOPHAGOGASTRODUODENOSCOPY N/A 08/28/2015   Procedure: ESOPHAGOGASTRODUODENOSCOPY (EGD);  Surgeon: Rogene Houston, MD;  Location: [facility];  Service: Endoscopy;  Laterality: N/A;  910am  . ESOPHAGOGASTRODUODENOSCOPY N/A 10/22/2015   Procedure: ESOPHAGOGASTRODUODENOSCOPY (EGD);  Surgeon: Rogene Houston, MD;  Location: [facility];  Service: Endoscopy;  Laterality: N/A;  12:00  . FRACTURE SURGERY     Hx: of right wrist surgery  . GASTRIC BYPASS  1981  . INCISIONAL HERNIA REPAIR N/A 06/14/2018   Procedure: LAPAROSCOPIC INCISIONAL HERNIA REPAIR WITH INSERTION OF MESH ERAS PATHWAY;  Surgeon: Kinsinger, Arta Bruce, MD;  Location: [facility];  Service: General;  Laterality: N/A;  . TONSILLECTOMY       OB History    Gravida  3   Para  1   Term  1   Preterm      AB  2   Living  1     SAB  1   TAB      Ectopic      Multiple      Live Births  1           Family History  Problem Relation Age of Onset  . Stroke Mother   . Heart disease Mother   . Diabetes Mother   . Heart disease Father   . Cancer - Colon Father   . Other Brother     Social History   Tobacco Use  . Smoking status: Never Smoker  . Smokeless tobacco: Never Used  Vaping Use  . Vaping Use: Never used  Substance Use Topics  . Alcohol use: No  . Drug use: No    Home Medications Prior to Admission medications   Medication Sig Start Date End Date Taking? Authorizing Provider  ALPRAZolam (XANAX) 0.5 MG tablet TAKE 1 TABLET BY MOUTH TWICE DAILY AS NEEDED FOR ANXIETY Patient taking differently: Take 0.25-0.5 mg by mouth 2 (two) times daily as needed for  anxiety.  08/03/20  Yes Luking, Elayne Snare, MD  esomeprazole (NEXIUM) 20 MG capsule Take 20 mg by mouth daily.    Yes [provider]  famotidine (PEPCID) 20 MG tablet TAKE 1 TABLET(20 MG) BY MOUTH TWICE DAILY Patient taking differently: Take 20 mg by mouth 2 (two) times daily as needed for heartburn or indigestion.  03/17/20  Yes Luking, Elayne Snare, MD  fluticasone (FLONASE) 50 MCG/ACT nasal spray INSTILL 2 SPRAYS INTO EACH NOSTRIL ONCE DAILY Patient taking differently: Place 1 spray into both nostrils daily as needed for allergies.  03/21/19  Yes Luking, Elayne Snare, MD  hydrochlorothiazide (HYDRODIURIL) 25 MG tablet TAKE 1 TABLET(25 MG) BY MOUTH DAILY Patient taking differently: Take 25 mg by mouth daily. TAKE 1 TABLET(25 MG) BY MOUTH  DAILY 10/14/20  Yes Kathyrn Drown, MD  ibuprofen (ADVIL) 200 MG tablet Take 400 mg by mouth 3 (three) times daily.   Yes [provider]  levothyroxine (SYNTHROID) 25 MCG tablet Take 8 tablets po each Thursday Patient taking differently: Take 200 mcg by mouth once a week. Take 8 tablets=200 mcg po each Saturday 08/03/20  Yes Luking, Elayne Snare, MD  lisinopril (ZESTRIL) 2.5 MG tablet Take 1 tablet (2.5 mg total) by mouth daily. 08/03/20  Yes Kathyrn Drown, MD  loratadine (CLARITIN) 10 MG tablet Take 10 mg by mouth daily.    Yes [provider]  nortriptyline (PAMELOR) 10 MG capsule Take 1 capsule (10 mg total) by mouth at bedtime. 09/30/20  Yes Kathrynn Ducking, MD  oxyCODONE (OXY IR/ROXICODONE) 5 MG immediate release tablet Take 1 tablet (5 mg total) by mouth every 4 (four) hours as needed for severe pain. Patient taking differently: Take 5 mg by mouth every 4 (four) hours.  08/03/20  Yes Luking, Scott A, MD  polyethylene glycol powder (GLYCOLAX/MIRALAX) powder Take 17 g by mouth daily.    Yes [provider]  pravastatin (PRAVACHOL) 20 MG tablet TAKE 1 TABLET DAILY 05/08/20  Yes Luking, Elayne Snare, MD  tiZANidine (ZANAFLEX) 4 MG tablet 1/2 tablet  three times during the day, one tablet at night Patient taking differently: Take 2-4 mg by mouth 3 (three) times daily. Take 1/2 tablet (2 mg) BID then Take 1 tablet (4 mg) at night 09/30/20  Yes Kathrynn Ducking, MD  predniSONE (DELTASONE) 10 MG tablet Begin taking 6 tablets daily, taper by one tablet daily until off the medication. Patient not taking: Reported on 10/18/2020 09/30/20   Kathrynn Ducking, MD  sulfamethoxazole-trimethoprim (BACTRIM) 400-80 MG tablet Take 1 tablet by mouth 2 (two) times daily. Patient not taking: Reported on 10/18/2020 07/21/20   Chalmers Guest, NP  tobramycin-dexamethasone [facility]) ophthalmic solution Place 1 drop into the right eye 4 (four) times daily. Up to 5 days Patient not taking: Reported on 10/18/2020 08/10/18   Kathyrn Drown, MD  triamcinolone cream (KENALOG) 0.1 % Apply 1 application topically daily as needed (rash). Patient not taking: Reported on 10/18/2020 09/30/20   Kathrynn Ducking, MD    Allergies    Topamax [topiramate], Celexa [citalopram hydrobromide], Fosamax [alendronate sodium], Neurontin [gabapentin], and Soma [carisoprodol]  Review of Systems   Review of Systems  Constitutional: Positive for fatigue. Negative for chills, diaphoresis and fever.  HENT: Negative for ear pain and sore throat.   Eyes: Negative for pain and visual disturbance.  Respiratory: Negative for cough and shortness of breath.   Cardiovascular: Positive for chest pain. Negative for palpitations.  Gastrointestinal: Positive for constipation (chronic). Negative for abdominal pain, diarrhea, heartburn, nausea and vomiting.  Genitourinary: Negative for dysuria and hematuria.  Musculoskeletal: Positive for back pain. Negative for arthralgias.  Skin: Negative for color change and rash.  Neurological: Positive for headaches. Negative for seizures, syncope and numbness.  All other systems reviewed and are negative.   Physical Exam Updated Vital Signs BP (!) 101/52    Pulse 92   Temp 98.5 F (36.9 C) (Oral)   Resp 12   SpO2 100%   Physical Exam Vitals and nursing note reviewed.  Constitutional:      General: She is not in acute distress.    Appearance: She is well-developed.  HENT:     Head: Normocephalic and atraumatic.  Eyes:     Extraocular Movements: Extraocular movements  intact.     Conjunctiva/sclera: Conjunctivae normal.     Pupils: Pupils are equal, round, and reactive to light.  Cardiovascular:     Rate and Rhythm: Normal rate and regular rhythm.  Pulmonary:     Effort: Pulmonary effort is normal. No tachypnea or respiratory distress.  Abdominal:     Palpations: Abdomen is soft.     Tenderness: There is no abdominal tenderness.  Musculoskeletal:     Cervical back: Neck supple.  Skin:    General: Skin is warm and dry.  Neurological:     General: No focal deficit present.     Mental Status: She is alert and oriented to person, place, and time.     Cranial Nerves: No cranial nerve deficit.     Motor: No weakness.     ED Results / Procedures / Treatments   Labs (all labs ordered are listed, but only abnormal results are displayed) Labs Reviewed  BASIC METABOLIC PANEL - Abnormal; Notable for the following components:      Result Value   Sodium 131 (*)    Potassium 3.2 (*)    Chloride 91 (*)    Glucose, Bld 110 (*)    All other components within normal limits  RESP PANEL BY RT-PCR (FLU A&B, COVID) ARPGX2  CBC  CBC  CREATININE, SERUM  COMPREHENSIVE METABOLIC PANEL  CBC  SEDIMENTATION RATE  ANTINUCLEAR ANTIBODIES, IFA  C-REACTIVE PROTEIN  HIV ANTIBODY (ROUTINE TESTING W REFLEX)  TROPONIN I (HIGH SENSITIVITY)  TROPONIN I (HIGH SENSITIVITY)    EKG EKG Interpretation  Date/Time:  Sunday October 18 2020 17:52:29 EST Ventricular Rate:  99 PR Interval:  146 QRS Duration: 82 QT Interval:  334 QTC Calculation: 428 R Axis:   14 Text Interpretation: Normal sinus rhythm Possible Acute pericarditis Abnormal ECG Since  last tracing diffuse ST elevation is now present Confirmed by Calvert Cantor [phone]) on 10/18/2020 9:10:54 PM   Radiology DG Chest 2 View  Result Date: 10/18/2020 CLINICAL DATA:  Chest pain EXAM: CHEST - 2 VIEW COMPARISON:  Chest radiograph dated 09/25/2014. FINDINGS: The heart size and mediastinal contours are within normal limits. Both lungs are clear. Degenerative changes are seen in the spine. There is diffuse osseous demineralization. IMPRESSION: No active cardiopulmonary disease. Electronically Signed   By: Zerita Boers M.D.   On: 10/18/2020 18:55    Procedures Procedures (including critical care time)  Medications Ordered in ED Medications  potassium chloride (KLOR-CON) packet 40 mEq (40 mEq Oral Given 10/18/20 2235)  heparin injection 5,000 Units (5,000 Units Subcutaneous Given 10/18/20 2236)  nortriptyline (PAMELOR) capsule 10 mg (10 mg Oral Given 10/18/20 2231)  colchicine tablet 0.6 mg (0.6 mg Oral Given 10/18/20 2317)  indomethacin (INDOCIN) capsule 50 mg (has no administration in time range)  pantoprazole (PROTONIX) EC tablet 40 mg (40 mg Oral Refused 10/18/20 2233)  alum & mag hydroxide-simeth (MAALOX/MYLANTA) 200-200-20 MG/5ML suspension 30 mL (30 mLs Oral Given 10/18/20 2234)    And  lidocaine (XYLOCAINE) 2 % viscous mouth solution 15 mL (15 mLs Oral Given 10/18/20 2234)  acetaminophen (TYLENOL) tablet 1,000 mg (1,000 mg Oral Given 10/18/20 2231)    ED Course  I have reviewed the triage vital signs and the nursing notes.  Pertinent labs & imaging results that were available during my care of the patient were reviewed by me and considered in my medical decision making (see chart for details).    MDM Rules/Calculators/A&P  MDM: Katelee Schupp is a 69 y.o. female who presents with chest pain as per above. I have reviewed the nursing documentation for past medical history, family history, and social history. Pertinent previous records  reviewed. She is awake, alert. HDS. Afebrile. Physical exam is most notable for overall well-appearing 69 year old female with equal pulses in all 4 extremities.  Labs: Troponin negative x2.  CBC unremarkable.  BMP with potassium 3.2, sodium 131. EKG: Normal sinus rhythm with diffuse ST elevations concerning for pericarditis, changed from prior EKG.  No reciprocal changes or ST depressions.  Imaging: CXR demonstrating no acute cardiopulmonary abnormality Consults: Cardiology Tx: Home nortriptyline, 1000 g Tylenol, Maalox and viscous lidocaine  Differential Dx: I am most concerned for pericarditis. Given history, physical exam, and work-up, I do not think she has STEMI/NSTEMI, PE, myocarditis, perimyocarditis, esophageal pathology, pneumothorax, pneumonia, aortic dissection/aneurysm, pancreatitis, bowel perf, or trauma.  MDM: XAN SPARKMAN is a 69 y.o. female presents from an urgent care with initial concern for STEMI due to diffuse ST elevations.  Per cardiology, no STEMI but there is concern for pericarditis.  Unclear etiology as patient has not had any recent viral illnesses.  Troponins negative x2 so doubt myocarditis.  Patient no acute distress on exam.  Requesting home nortriptyline for her migraine.  Cardiology consulted who will admit the patient for pericarditis.  Patient updated on thought process and plan for admission, patient amenable to this plan.  Admitted in stable condition.  Handoff given to cardiologist.  The plan for this patient was discussed with Dr. Karle Starch, who voiced agreement and who oversaw evaluation and treatment of this patient.   Final Clinical Impression(s) / ED Diagnoses Final diagnoses:  None    Rx / DC Orders ED Discharge Orders    None       Quante Pettry, MD 10/18/20 8768    Truddie Hidden, MD 10/19/20 1046

## 2020-10-18 NOTE — ED Provider Notes (Signed)
HPI  SUBJECTIVE:  Amanda Osborne is a 69 y.o. female who presents with bilateral diffuse chest pain described as "inflammation" and as burning with radiation between her shoulder blades that started last night.  States it has been constant, waxing and waning.  She states that she has had identical bilateral burning shoulder pain for the past 20 years due to cervical neuralgia and attributed her chest pain to that.  She reports shortness of breath, states it feels as if "my lungs are being squeezed".  No exertional or positional component.  No nausea, diaphoresis, palpitations, dizziness.  No aggravating or alleviating factors.  She has not tried anything for this.  She finished a prednisone taper 4 days ago for cervical neuralgia.  No recent vaccines or recent viral illnesses.  She has a past medical history of cervical neuralgia, hypertension, hypercholesterolemia, GERD.  No history of diabetes, coronary artery disease, smoking.  Family history negative for MI.  HGD:JMEQAS, Elayne Snare, MD   Past Medical History:  Diagnosis Date  . Anxiety   . Arthritis   . Cervical disc herniation    c5 and c6  . Cervicogenic headache 05/13/2019  . Chronic pain   . Complication of anesthesia   . Fibromyalgia   . GERD (gastroesophageal reflux disease)   . Headache(784.0)    Hx: of migraines until age 62's  . Hyperlipidemia   . Hypertension   . Iron deficiency anemia 11/30/2012  . Left carpal tunnel syndrome 11/07/2018  . Narcolepsy   . Occipital neuralgia   . PONV (postoperative nausea and vomiting)    DURING FIRST NECK SURGERY ; BUT REPORT IT MAY HAVE BEEN THE DILAUDID THAT CAUSED THE VOITING I  . Unspecified vitamin D deficiency 03/26/2013  . Wrist fracture, bilateral 2005 and 2010   right then left    Past Surgical History:  Procedure Laterality Date  . ANTERIOR CERVICAL DECOMP/DISCECTOMY FUSION  2000   C6-C7 DR Ellene Route   . ANTERIOR CERVICAL DECOMP/DISCECTOMY FUSION N/A 08/05/2013   Procedure:  Cervical five-six Anterior cervical decompression/diskectomy/fusion/ Synthes plate removal;  Surgeon: Kristeen Miss, MD;  Location: [facility];  Service: Neurosurgery;  Laterality: N/A;  . BONE GRAFT HIP ILIAC CREST  2006   to right wrist to correct fracture, DR.  ORTHO   . COLONOSCOPY     Hx; of  . DILATION AND CURETTAGE OF UTERUS    . ESOPHAGEAL DILATION N/A 10/22/2015   Procedure: ESOPHAGEAL DILATION;  Surgeon: Rogene Houston, MD;  [facility];  Service: Endoscopy;  Laterality: N/A;  . ESOPHAGOGASTRODUODENOSCOPY N/A 08/28/2015   Procedure: ESOPHAGOGASTRODUODENOSCOPY (EGD);  Surgeon: Rogene Houston, MD;  Location: [facility];  Service: Endoscopy;  Laterality: N/A;  910am  . ESOPHAGOGASTRODUODENOSCOPY N/A 10/22/2015   Procedure: ESOPHAGOGASTRODUODENOSCOPY (EGD);  Surgeon: Rogene Houston, MD;  [facility]: [facility];  Service: Endoscopy;  Laterality: N/A;  12:00  . FRACTURE SURGERY     Hx: of right wrist surgery  . GASTRIC BYPASS  1981  . INCISIONAL HERNIA REPAIR N/A 06/14/2018   Procedure: LAPAROSCOPIC INCISIONAL HERNIA REPAIR WITH INSERTION OF MESH ERAS PATHWAY;  Surgeon: Kinsinger, Arta Bruce, MD;  Location: [facility];  Service: General;  Laterality: N/A;  . TONSILLECTOMY      Family History  Problem Relation Age of Onset  . Stroke Mother   . Heart disease Mother   . Diabetes Mother   . Heart disease Father   . Cancer - Colon Father   . Other Brother  Social History   Tobacco Use  . Smoking status: Never Smoker  . Smokeless tobacco: Never Used  Vaping Use  . Vaping Use: Never used  Substance Use Topics  . Alcohol use: No  . Drug use: No    No current facility-administered medications for this encounter.  Current Outpatient Medications:  .  ALPRAZolam (XANAX) 0.5 MG tablet, TAKE 1 TABLET BY MOUTH TWICE DAILY AS NEEDED FOR ANXIETY, Disp: 60 tablet, Rfl: 5 .  esomeprazole (NEXIUM) 20 MG capsule, Take 20 mg by mouth daily. , Disp: , Rfl:  .   famotidine (PEPCID) 20 MG tablet, TAKE 1 TABLET(20 MG) BY MOUTH TWICE DAILY, Disp: 60 tablet, Rfl: 2 .  fluticasone (FLONASE) 50 MCG/ACT nasal spray, INSTILL 2 SPRAYS INTO EACH NOSTRIL ONCE DAILY, Disp: 16 g, Rfl: 5 .  hydrochlorothiazide (HYDRODIURIL) 25 MG tablet, TAKE 1 TABLET(25 MG) BY MOUTH DAILY, Disp: 90 tablet, Rfl: 0 .  levothyroxine (SYNTHROID) 25 MCG tablet, Take 8 tablets po each Thursday, Disp: 90 tablet, Rfl: 1 .  lisinopril (ZESTRIL) 2.5 MG tablet, Take 1 tablet (2.5 mg total) by mouth daily., Disp: 90 tablet, Rfl: 1 .  loratadine (CLARITIN) 10 MG tablet, Take 10 mg by mouth daily as needed for allergies. , Disp: , Rfl:  .  nortriptyline (PAMELOR) 10 MG capsule, Take 1 capsule (10 mg total) by mouth at bedtime., Disp: 90 capsule, Rfl: 3 .  oxyCODONE (OXY IR/ROXICODONE) 5 MG immediate release tablet, Take 1 tablet (5 mg total) by mouth every 4 (four) hours as needed for severe pain., Disp: 180 tablet, Rfl: 0 .  polyethylene glycol powder (GLYCOLAX/MIRALAX) powder, Take 17 g by mouth daily. , Disp: , Rfl:  .  pravastatin (PRAVACHOL) 20 MG tablet, TAKE 1 TABLET DAILY, Disp: 30 tablet, Rfl: 5 .  predniSONE (DELTASONE) 10 MG tablet, Begin taking 6 tablets daily, taper by one tablet daily until off the medication., Disp: 21 tablet, Rfl: 0 .  sulfamethoxazole-trimethoprim (BACTRIM) 400-80 MG tablet, Take 1 tablet by mouth 2 (two) times daily., Disp: 14 tablet, Rfl: 0 .  tiZANidine (ZANAFLEX) 4 MG tablet, 1/2 tablet three times during the day, one tablet at night, Disp: 30 tablet, Rfl: 2 .  tobramycin-dexamethasone (TOBRADEX) ophthalmic solution, Place 1 drop into the right eye 4 (four) times daily. Up to 5 days, Disp: 5 mL, Rfl: 0 .  triamcinolone cream (KENALOG) 0.1 %, Apply 1 application topically daily as needed (rash)., Disp: , Rfl:  .  UNABLE TO FIND, Fanny cream-prn, Disp: , Rfl:   Allergies  Allergen Reactions  . Topamax [Topiramate] Anaphylaxis    Memory issues  . Celexa  [Citalopram Hydrobromide] Nausea And Vomiting  . Fosamax [Alendronate Sodium]     Esophagitis   . Neurontin [Gabapentin]     Drowsy   . Soma [Carisoprodol]     dizzy     ROS  As noted in HPI.   Physical Exam  BP (!) 154/87 (BP Location: Right Arm)   Pulse (!) 107   Temp 98.6 F (37 C) (Oral)   Resp 16   SpO2 98%   Constitutional: Well developed, well nourished, no acute distress Eyes:  EOMI, conjunctiva normal bilaterally HENT: Normocephalic, atraumatic,mucus membranes moist Respiratory: Normal inspiratory effort, lungs clear bilaterally Cardiovascular: Regular tachycardia no murmurs rubs or gallops. RP 2+ and equal bilaterally.   GI: nondistended, soft, nontender, active bowel sounds. skin: No rash, skin intact Musculoskeletal: no deformities Neurologic: Alert & oriented x 3, no focal neuro deficits Psychiatric: Speech  and behavior appropriate   ED Course   Medications - No data to display  No orders of the defined types were placed in this encounter.   No results found for this or any previous visit (from the past 24 hour(s)). No results found.  ED Clinical Impression  1. ST elevation   2. Chest pain, unspecified type      ED Assessment/Plan  ED: Normal sinus rhythm, rate 97.  Normal axis, normal intervals.  Diffuse ST elevation in all leads with no reciprocal changes.  Unable to see previous EKG for comparison.  Primary concern for pericarditis given the diffuse ST elevation, however, with the ST elevation treating as a code STEMI for now.  Patient was given aspirin 324 mg p.o., line and fluids started.  EMS called.    No orders of the defined types were placed in this encounter.   *This clinic note was created using Dragon dictation software. Therefore, there may be occasional mistakes despite careful proofreading.   ?    Melynda Ripple, MD 10/18/20 1710

## 2020-10-18 NOTE — ED Triage Notes (Signed)
Pt presents with c/o chronic pain from occipital neuralgia, pt recently completed prednisone dose pack and felt a little better but then pain returned in shoulder and chest a couple days ago

## 2020-10-18 NOTE — ED Triage Notes (Signed)
Patient is being discharged from the Urgent Care and sent to the Emergency Department via ems  . Per Dr Alphonzo Cruise, patient is in need of higher level of care due to STEMI. Patient is aware and verbalizes understanding of plan of care.  Vitals:   10/18/20 1615  BP: (!) 154/87  Pulse: (!) 107  Resp: 16  Temp: 98.6 F (37 C)  SpO2: 98%

## 2020-10-18 NOTE — ED Notes (Signed)
Pt placed on 2 L o2 4 baby asa given po , ems starting IV

## 2020-10-18 NOTE — H&P (Signed)
Referring Physician: [name] is an 69 y.o. female.                       Chief Complaint: Chest pain  HPI: 69 years old white female with PMH of HTN, GERD, Fibromyalgia, Neck pain, arthritis and hyperlipidemia has burning type chest pain radiating to upper back between shoulder blades since yesterday. She had tapering course of prednisone for inflammation and after last dose of the pack she started feeling this chest pain. Her EKG shows diffuse ST elevation with PR drop suggestive of acute pericarditis. She had similar pain on and off for several years now. She denies nausea, shortness of breath or sweating spell. Some of the chest pain is reproducible on palpation over mid and low sternum.  Her HS-troponin I levels are normal x 2. BMET shows mild hypokalemia. CBC is normal. Chest x-ray is unremarkable.  Past Medical History:  Diagnosis Date  . Anxiety   . Arthritis   . Cervical disc herniation    c5 and c6  . Cervicogenic headache 05/13/2019  . Chronic pain   . Complication of anesthesia   . Fibromyalgia   . GERD (gastroesophageal reflux disease)   . Headache(784.0)    Hx: of migraines until age 23's  . Hyperlipidemia   . Hypertension   . Iron deficiency anemia 11/30/2012  . Left carpal tunnel syndrome 11/07/2018  . Narcolepsy   . Occipital neuralgia   . PONV (postoperative nausea and vomiting)    DURING FIRST NECK SURGERY ; BUT REPORT IT MAY HAVE BEEN THE DILAUDID THAT CAUSED THE VOITING I  . Unspecified vitamin D deficiency 03/26/2013  . Wrist fracture, bilateral 2005 and 2010   right then left      Past Surgical History:  Procedure Laterality Date  . ANTERIOR CERVICAL DECOMP/DISCECTOMY FUSION  2000   C6-C7 DR Ellene Route   . ANTERIOR CERVICAL DECOMP/DISCECTOMY FUSION N/A 08/05/2013   Procedure: Cervical five-six Anterior cervical decompression/diskectomy/fusion/ Synthes plate removal;  Surgeon: Kristeen Miss, MD;  Location: [facility];  Service: Neurosurgery;   Laterality: N/A;  . BONE GRAFT HIP ILIAC CREST  2006   to right wrist to correct fracture, DR. Albert ORTHO   . COLONOSCOPY     Hx; of  . DILATION AND CURETTAGE OF UTERUS    . ESOPHAGEAL DILATION N/A 10/22/2015   Procedure: ESOPHAGEAL DILATION;  Surgeon: Rogene [place], MD;  Location: [facility];  Service: Endoscopy;  Laterality: N/A;  . ESOPHAGOGASTRODUODENOSCOPY N/A 08/28/2015   Procedure: ESOPHAGOGASTRODUODENOSCOPY (EGD);  Surgeon: Rogene [place], MD;  Location: [facility];  Service: Endoscopy;  Laterality: N/A;  910am  . ESOPHAGOGASTRODUODENOSCOPY N/A 10/22/2015   Procedure: ESOPHAGOGASTRODUODENOSCOPY (EGD);  Surgeon: Rogene [place], MD;  Location: [facility];  Service: Endoscopy;  Laterality: N/A;  12:00  . FRACTURE SURGERY     Hx: of right wrist surgery  . GASTRIC BYPASS  1981  . INCISIONAL HERNIA REPAIR N/A 06/14/2018   Procedure: LAPAROSCOPIC INCISIONAL HERNIA REPAIR WITH INSERTION OF MESH ERAS PATHWAY;  Surgeon: Kinsinger, Arta Bruce, MD;  Location: [facility];  Service: General;  Laterality: N/A;  . TONSILLECTOMY      Family History  Problem Relation Age of Onset  . Stroke Mother   . Heart disease Mother   . Diabetes Mother   . Heart disease Father   . Cancer - Colon Father   . Other Brother    Social History:  reports  that she has never smoked. She has never used smokeless tobacco. She reports that she does not drink alcohol and does not use drugs.  Allergies:  Allergies  Allergen Reactions  . Topamax [Topiramate] Anaphylaxis    Memory issues  . Celexa [Citalopram Hydrobromide] Nausea And Vomiting  . Fosamax [Alendronate Sodium]     Esophagitis   . Neurontin [Gabapentin]     Drowsy   . Soma [Carisoprodol]     dizzy    (Not in a hospital admission)   Results for orders placed or performed during the hospital encounter of 10/18/20 (from the past 48 hour(s))  Basic metabolic panel     Status: Abnormal   Collection Time: 10/18/20  6:01 PM  Result  Value Ref Range   Sodium 131 (L) 135 - 145 mmol/L   Potassium 3.2 (L) 3.5 - 5.1 mmol/L   Chloride 91 (L) 98 - 111 mmol/L   CO2 30 22 - 32 mmol/L   Glucose, Bld 110 (H) 70 - 99 mg/dL    Comment: Glucose reference range applies only to samples taken after fasting for at least 8 hours.   BUN 21 8 - 23 mg/dL   Creatinine, Ser 0.58 0.44 - 1.00 mg/dL   Calcium 9.5 8.9 - 10.3 mg/dL   GFR, Estimated >60 >60 mL/min    Comment: (NOTE) Calculated using the CKD-EPI Creatinine Equation (2021)    Anion gap 10 5 - 15    Comment: Performed at [facility] [address], [place] [address]  CBC     Status: None   Collection Time: 10/18/20  6:01 PM  Result Value Ref Range   WBC 9.1 4.0 - 10.5 K/uL   RBC 5.05 3.87 - 5.11 MIL/uL   Hemoglobin 13.6 12.0 - 15.0 g/dL   HCT 42.9 36 - 46 %   MCV 85.0 80.0 - 100.0 fL   MCH 26.9 26.0 - 34.0 pg   MCHC 31.7 30.0 - 36.0 g/dL   RDW 14.5 11.5 - 15.5 %   Platelets 356 150 - 400 K/uL   nRBC 0.0 0.0 - 0.2 %    Comment: Performed at [facility], [place] [address] [place], [place] [address]  Troponin I (High Sensitivity)     Status: None   Collection Time: 10/18/20  6:01 PM  Result Value Ref Range   Troponin I (High Sensitivity) 6 <18 ng/L    Comment: (NOTE) Elevated high sensitivity troponin I (hsTnI) values and significant  changes across serial measurements may suggest ACS but many other  chronic and acute conditions are known to elevate hsTnI results.  Refer to the "Links" section for chest pain algorithms and additional  guidance. Performed at [facility], [place] [address] [place], [place] [address]   Troponin I (High Sensitivity)     Status: None   Collection Time: 10/18/20  8:11 PM  Result Value Ref Range   Troponin I (High Sensitivity) 6 <18 ng/L    Comment: (NOTE) Elevated high sensitivity troponin I (hsTnI) values and significant  changes across serial measurements may suggest ACS but many other  chronic and acute  conditions are known to elevate hsTnI results.  Refer to the "Links" section for chest pain algorithms and additional  guidance. Performed at [facility], [place] [address] [place], [place] [address]    DG Chest 2 View  Result Date: 10/18/2020 CLINICAL DATA:  Chest pain EXAM: CHEST - 2 VIEW COMPARISON:  Chest  radiograph dated 09/25/2014. FINDINGS: The heart size and mediastinal contours are within normal limits. Both lungs are clear. Degenerative changes are seen in the spine. There is diffuse osseous demineralization. IMPRESSION: No active cardiopulmonary disease. Electronically Signed   By: Zerita Boers M.D.   On: 10/18/2020 18:55    Review Of Systems Constitutional: No fever, chills, weight loss or gain. Eyes: No vision change, wears glasses. No discharge or pain. Ears: No hearing loss, No tinnitus. Respiratory: No asthma, COPD, pneumonias. No shortness of breath. No hemoptysis. Cardiovascular: Positive chest pain, palpitation, leg edema. Gastrointestinal: No nausea, vomiting, diarrhea, constipation. No GI bleed. No hepatitis. Genitourinary: No dysuria, hematuria, kidney stone. No incontinance. Neurological: No headache, stroke, seizures.  Psychiatry: No psych facility admission for anxiety, depression, suicide. No detox. Skin: No rash. Musculoskeletal: Positive joint pain, fibromyalgia, neck pain, back pain. Lymphadenopathy: No lymphadenopathy. Hematology: Positive anemia, no easy bruising.   Blood pressure 114/65, pulse 89, temperature 98.5 F (36.9 C), temperature source Oral, resp. rate 11, SpO2 95 %. There is no height or weight on file to calculate BMI. General appearance: alert, cooperative, appears stated age and no distress Head: Normocephalic, atraumatic. Eyes: Blue eyes, pink conjunctiva, corneas clear. PERRL, EOM's intact. Neck: No adenopathy, no carotid bruit, no JVD, supple, symmetrical, trachea midline and thyroid not enlarged. Resp: Clear to auscultation  bilaterally. Cardio: Regular rate and rhythm, S1, S2 normal, II/VI systolic murmur, no click, positive faint pericardial rub. No gallop GI: Soft, non-tender; bowel sounds normal; no organomegaly. Extremities: No edema, cyanosis or clubbing. Skin: Warm and dry.  Neurologic: Alert and oriented X 3, normal strength. Normal coordination and gait.  Assessment/Plan Chest pain Acute pericarditis HTN Arthritis GERD Hyperlipidemia  Start Colchicine, Indomethacin. Echocardiogram, r/o pericarditis/Effusion ESR, C-reactive protein, ANA. No cultures in absence of fever or leukocytosis  Time spent: Review of old records, Lab, x-rays, EKG, other cardiac tests, examination, discussion with patient/Physician over 70 minutes.  Amanda Riddle, MD  10/18/2020, 9:30 PM

## 2020-10-19 ENCOUNTER — Observation Stay (HOSPITAL_COMMUNITY): Payer: Medicare Other

## 2020-10-19 DIAGNOSIS — I10 Essential (primary) hypertension: Secondary | ICD-10-CM | POA: Diagnosis not present

## 2020-10-19 DIAGNOSIS — I309 Acute pericarditis, unspecified: Secondary | ICD-10-CM | POA: Diagnosis not present

## 2020-10-19 DIAGNOSIS — R072 Precordial pain: Secondary | ICD-10-CM | POA: Diagnosis not present

## 2020-10-19 DIAGNOSIS — E7849 Other hyperlipidemia: Secondary | ICD-10-CM | POA: Diagnosis not present

## 2020-10-19 LAB — COMPREHENSIVE METABOLIC PANEL
ALT: 17 U/L (ref 0–44)
AST: 19 U/L (ref 15–41)
Albumin: 3.5 g/dL (ref 3.5–5.0)
Alkaline Phosphatase: 50 U/L (ref 38–126)
Anion gap: 12 (ref 5–15)
BUN: 18 mg/dL (ref 8–23)
CO2: 27 mmol/L (ref 22–32)
Calcium: 9.3 mg/dL (ref 8.9–10.3)
Chloride: 93 mmol/L — ABNORMAL LOW (ref 98–111)
Creatinine, Ser: 0.64 mg/dL (ref 0.44–1.00)
GFR, Estimated: >60 mL/min (ref >60)
Glucose, Bld: 121 mg/dL — ABNORMAL HIGH (ref 70–99)
Potassium: 3.8 mmol/L (ref 3.5–5.1)
Sodium: 132 mmol/L — ABNORMAL LOW (ref 135–145)
Total Bilirubin: 0.8 mg/dL (ref 0.3–1.2)
Total Protein: 6.5 g/dL (ref 6.5–8.1)

## 2020-10-19 LAB — CBC
HCT: 44.3 % (ref 36.0–46.0)
Hemoglobin: 13.9 g/dL (ref 12.0–15.0)
MCH: 26.8 pg (ref 26.0–34.0)
MCHC: 31.4 g/dL (ref 30.0–36.0)
MCV: 85.5 fL (ref 80.0–100.0)
Platelets: 309 10*3/uL (ref 150–400)
RBC: 5.18 MIL/uL — ABNORMAL HIGH (ref 3.87–5.11)
RDW: 14.5 % (ref 11.5–15.5)
WBC: 6.2 10*3/uL (ref 4.0–10.5)
nRBC: 0 % (ref 0.0–0.2)

## 2020-10-19 LAB — HIV ANTIBODY (ROUTINE TESTING W REFLEX): HIV Screen 4th Generation wRfx: NONREACTIVE

## 2020-10-19 LAB — ECHOCARDIOGRAM COMPLETE
Area-P 1/2: 3.12 cm2
P 1/2 time: 467 msec
S' Lateral: 2.4 cm

## 2020-10-19 LAB — SEDIMENTATION RATE: Sed Rate: 17 mm/hr (ref 0–22)

## 2020-10-19 MED ORDER — LEVOTHYROXINE SODIUM 25 MCG PO TABS
25.0000 ug | ORAL_TABLET | Freq: Every day | ORAL | Status: DC
Start: 2020-10-19 — End: 2021-01-12

## 2020-10-19 MED ORDER — COLCHICINE 0.6 MG PO TABS
0.6000 mg | ORAL_TABLET | Freq: Every day | ORAL | 3 refills | Status: DC
Start: 2020-10-20 — End: 2021-03-18

## 2020-10-19 MED ORDER — POTASSIUM CHLORIDE ER 10 MEQ PO TBCR: 10.0000 meq | EXTENDED_RELEASE_TABLET | Freq: Every day | ORAL | Status: DC

## 2020-10-19 MED ORDER — POTASSIUM CHLORIDE ER 10 MEQ PO TBCR
10.0000 meq | EXTENDED_RELEASE_TABLET | Freq: Every day | ORAL | 3 refills | Status: DC
Start: 2020-10-19 — End: 2020-11-02

## 2020-10-19 MED ORDER — INDOMETHACIN 50 MG PO CAPS
50.0000 mg | ORAL_CAPSULE | Freq: Two times a day (BID) | ORAL | 2 refills | Status: DC
Start: 2020-10-19 — End: 2021-09-27

## 2020-10-19 NOTE — ED Notes (Addendum)
Patient unhooked self from monitoring equipment, up and ambulating in halls. Gait steady. RN provided patient with recliner chair in room. She denies further needs.

## 2020-10-19 NOTE — ED Notes (Signed)
Patient reporting a lot of anxiety with being in the hospital. Patient calmed with update on plan and status of awaiting ECHO and results with cards. Patient verbalized understanding and has no further questions at this time.

## 2020-10-19 NOTE — ED Notes (Signed)
AVS given to patient. She verbalized understanding of f/u, medications, and instructions. PIV DC and pressure and dressing applied. No c/o pain at this time. No distress. Patient dressed and ambulated to the exit where her ride was waiting. All belongings with patient.

## 2020-10-19 NOTE — Discharge Summary (Signed)
Physician Discharge Summary  Patient ID: Amanda Osborne MRN: [id number] DOB/AGE: 1951-09-11 69 y.o.  Admit date: 10/18/2020 Discharge date: 10/19/2020  Admission Diagnoses: Chest pain Acute pericarditis Hypertension Arthritis GERD Hyperlipidemia  Discharge Diagnoses:  Principle problem: Acute pericarditis Active Problems:   Precordial chest pain   Hypertension   GERD   Arthritis   Hyperlipidemia   Hypokalemia, corrected   Hyperglycemia   Hyponatremia   Fibromyalgia  Discharged Condition: fair  Hospital Course: 69 years old white female with PMH of HTN, GERD, Fibromyalgia, Neck pain, hyperlipidemia and arthritis had burning type chest pain radiating to upper back between shoulder blades after tapering prednisone for inflammation. She had EKG changes of diffuse ST elevations with PR drop suggestive of acute pericarditis. Her echocardiogram showed trivial pericardial effusion. Her C-reactive protein was very high and ESR ws also high. ANA test result was pending. She was started on Colchicine and indomethacin with good relief. Her hypokalemia was corrected with potassium supplement. She will have further investigation of chest pain on OP basis in future. She will see primary care in 1 week and see me in 2 weeks. She understood she will need colchicine for 3-6 months or more and Indomethacin for 1-3 months.  Consults: cardiology  Significant Diagnostic Studies: labs: Near normal CBC, BMET, LFT, HIV test. Elevated CRP of 17 mg. ESR was 17 mm/hr. Sodium 131 meq and glucose was 110 to 121 mg.  EKG showed diffuse ST elevations with PR drop suggestive of acute pericarditis  Echocardiogram showed normal LV systolic function, mild MR and trivial pericardial effusion.  Treatments: Colchicine, indomethacin and potassium.  Discharge Exam: Blood pressure 132/79, pulse 94, temperature 98.4 F (36.9 C), temperature source Oral, resp. rate 15, SpO2 93 %. General appearance: alert,  cooperative and appears stated age. Head: Normocephalic, atraumatic. Eyes: Blue eyes, pink conjunctiva, corneas clear. PERRL, EOM's intact.  Neck: No adenopathy, no carotid bruit, no JVD, supple, symmetrical, trachea midline and thyroid not enlarged. Resp: Clear to auscultation bilaterally. Cardio: Regular rate and rhythm, S1, S2 normal, II/VI systolic murmur, no click, rub or gallop. GI: Soft, non-tender; bowel sounds normal; no organomegaly. Extremities: No edema, cyanosis or clubbing. Skin: Warm and dry.  Neurologic: Alert and oriented X 3, normal strength and tone. Normal coordination and slow gait.  Disposition: Discharge disposition: 01-Home or Self Care        Allergies as of 10/19/2020      Reactions   Topamax [topiramate] Anaphylaxis   Memory issues   Celexa [citalopram Hydrobromide] Nausea And Vomiting   Fosamax [alendronate Sodium]    Esophagitis    Neurontin [gabapentin]    Drowsy    Soma [carisoprodol]    dizzy      Medication List    STOP taking these medications   ibuprofen 200 MG tablet Commonly known as: ADVIL   predniSONE 10 MG tablet Commonly known as: DELTASONE   sulfamethoxazole-trimethoprim 400-80 MG tablet Commonly known as: BACTRIM   tobramycin-dexamethasone ophthalmic solution Commonly known as: TOBRADEX   triamcinolone 0.1 % Commonly known as: KENALOG     TAKE these medications   ALPRAZolam 0.5 MG tablet Commonly known as: XANAX TAKE 1 TABLET BY MOUTH TWICE DAILY AS NEEDED FOR ANXIETY What changed:   how much to take  how to take this  when to take this  reasons to take this  additional instructions   colchicine 0.6 MG tablet Take 1 tablet (0.6 mg total) by mouth daily. Start taking on: October 20, 2020  esomeprazole 20 MG capsule Commonly known as: NEXIUM Take 20 mg by mouth daily.   famotidine 20 MG tablet Commonly known as: PEPCID TAKE 1 TABLET(20 MG) BY MOUTH TWICE DAILY What changed: See the new  instructions.   fluticasone 50 MCG/ACT nasal spray Commonly known as: FLONASE INSTILL 2 SPRAYS INTO EACH NOSTRIL ONCE DAILY What changed:   how much to take  how to take this  when to take this  reasons to take this  additional instructions   hydrochlorothiazide 25 MG tablet Commonly known as: HYDRODIURIL TAKE 1 TABLET(25 MG) BY MOUTH DAILY What changed: See the new instructions.   indomethacin 50 MG capsule Commonly known as: INDOCIN Take 1 capsule (50 mg total) by mouth 2 (two) times daily with a meal.   levothyroxine 25 MCG tablet Commonly known as: SYNTHROID Take 1 tablet (25 mcg total) by mouth daily before breakfast. Take 1 tablet daily. What changed:   how much to take  how to take this  when to take this  additional instructions   lisinopril 2.5 MG tablet Commonly known as: ZESTRIL Take 1 tablet (2.5 mg total) by mouth daily.   loratadine 10 MG tablet Commonly known as: CLARITIN Take 10 mg by mouth daily.   nortriptyline 10 MG capsule Commonly known as: PAMELOR Take 1 capsule (10 mg total) by mouth at bedtime.   oxyCODONE 5 MG immediate release tablet Commonly known as: Oxy IR/ROXICODONE Take 1 tablet (5 mg total) by mouth every 4 (four) hours as needed for severe pain. What changed: when to take this   polyethylene glycol powder 17 GM/SCOOP powder Commonly known as: GLYCOLAX/MIRALAX Take 17 g by mouth daily.   potassium chloride 10 MEQ tablet Commonly known as: KLOR-CON Take 1 tablet (10 mEq total) by mouth daily.   pravastatin 20 MG tablet Commonly known as: PRAVACHOL TAKE 1 TABLET DAILY   tiZANidine 4 MG tablet Commonly known as: ZANAFLEX 1/2 tablet three times during the day, one tablet at night What changed:   how much to take  how to take this  when to take this  additional instructions       Follow-up Information    Luking, Elayne Snare, MD. Schedule an appointment as soon as possible for a visit in 1 week(s).   Specialty:  Family Medicine Contact information: [place] [address] [phone]        Dixie Dials, MD. Schedule an appointment as soon as possible for a visit in 2 week(s).   Specialty: Cardiology Contact information: [place] [address] [phone]               Time spent: Review of old chart, current chart, lab, x-ray, cardiac tests and discussion with patient over 60 minutes.  Signed: Birdie Riddle 10/19/2020, 2:32 PM

## 2020-10-19 NOTE — ED Notes (Signed)
Lunch Tray Ordered @ 1027. °

## 2020-10-19 NOTE — Progress Notes (Signed)
  Echocardiogram 2D Echocardiogram has been performed.  Amanda Osborne 10/19/2020, 9:57 AM

## 2020-10-19 NOTE — ED Notes (Addendum)
Patient unhooked self from monitoring equipment, ambulated to and from restroom with steady gait. Assisted patient with repositioning and provided her with pillow. She denies further needs at this time.

## 2020-10-19 NOTE — ED Notes (Signed)
RN to bedside to assist patient with calling her son, Amanda Osborne. Son and patient both aware of plan and status at this time. Patient is ambulating in the hallway to help with her anxiety, and then back to resting recliner chair in room on monitors. No pain or distress reported. Patient given ginger ale on ice as requested. No other requests at this time.

## 2020-10-20 ENCOUNTER — Other Ambulatory Visit (INDEPENDENT_AMBULATORY_CARE_PROVIDER_SITE_OTHER): Payer: Self-pay | Admitting: *Deleted

## 2020-10-20 LAB — ANTINUCLEAR ANTIBODIES, IFA: ANA Ab, IFA: NEGATIVE

## 2020-11-02 ENCOUNTER — Encounter: Payer: Self-pay | Admitting: Family Medicine

## 2020-11-02 ENCOUNTER — Other Ambulatory Visit: Payer: Self-pay

## 2020-11-02 ENCOUNTER — Ambulatory Visit (INDEPENDENT_AMBULATORY_CARE_PROVIDER_SITE_OTHER): Payer: Medicare Other | Admitting: Family Medicine

## 2020-11-02 ENCOUNTER — Telehealth (INDEPENDENT_AMBULATORY_CARE_PROVIDER_SITE_OTHER): Payer: Self-pay

## 2020-11-02 VITALS — BP 122/78 | HR 79 | Temp 98.0°F | Ht 66.5 in | Wt 185.0 lb

## 2020-11-02 DIAGNOSIS — I10 Essential (primary) hypertension: Secondary | ICD-10-CM | POA: Diagnosis not present

## 2020-11-02 DIAGNOSIS — Z79891 Long term (current) use of opiate analgesic: Secondary | ICD-10-CM

## 2020-11-02 DIAGNOSIS — M549 Dorsalgia, unspecified: Secondary | ICD-10-CM | POA: Diagnosis not present

## 2020-11-02 DIAGNOSIS — D509 Iron deficiency anemia, unspecified: Secondary | ICD-10-CM | POA: Diagnosis not present

## 2020-11-02 MED ORDER — OXYCODONE HCL 5 MG PO TABS: 5.0000 mg | ORAL_TABLET | ORAL | 0 refills | Status: DC | PRN

## 2020-11-02 MED ORDER — OXYCODONE HCL 5 MG PO TABS
ORAL_TABLET | ORAL | 0 refills | Status: DC
Start: 2020-11-02 — End: 2020-12-17

## 2020-11-02 NOTE — Progress Notes (Signed)
Subjective:    Patient ID: Amanda Osborne, female    DOB: [birth year], 69 y.o.   MRN: [id number]  HPI This patient was seen today for chronic pain  The medication list was reviewed and updated.   -Compliance with medication: takes 6 a day  - Number patient states they take daily: 6  -when was the last dose patient took? Today about one hour ago  The patient was advised the importance of maintaining medication and not using illegal substances with these.  Here for refills and follow up  The patient was educated that we can provide 3 monthly scripts for their medication, it is their responsibility to follow the instructions.  Side effects or complications from medications: none  Patient is aware that pain medications are meant to minimize the severity of the pain to allow their pain levels to improve to allow for better function. They are aware of that pain medications cannot totally remove their pain.  Due for UDT ( at least once per year) : last one 08/05/20  Scale of 1 to 10 ( 1 is least 10 is most) Your pain level without the medicine: 9 Your pain level with medication: 2  Scale 1 to 10 ( 1-helps very little, 10 helps very well) How well does your pain medication reduce your pain so you can function better through out the day? 2  Would like to discuss hospital visit.  Patient with recent history of pericarditis we reviewed over all of the testing as well as what they have her on including colchicine and indomethacin so far tolerating this more than likely will need a follow-up ultrasound and cardiology consult Patient states she has an appointment with them coming up  Patient has ongoing upper back pain neck pain discomfort that is just not getting better it is very frustrating to the patient she is interested in seeing if there is something else it could be done to evaluate this        Review of Systems  Constitutional: Negative for activity change, appetite change  and fatigue.  HENT: Negative for congestion and rhinorrhea.   Respiratory: Negative for cough and shortness of breath.   Cardiovascular: Positive for chest pain. Negative for leg swelling.  Gastrointestinal: Negative for abdominal pain and diarrhea.  Endocrine: Negative for polydipsia and polyphagia.  Skin: Negative for color change.  Neurological: Negative for dizziness and weakness.  Psychiatric/Behavioral: Negative for behavioral problems and confusion.       Objective:   Physical Exam Vitals reviewed.  Constitutional:      General: She is not in acute distress.    Appearance: She is well-nourished.  HENT:     Head: Normocephalic and atraumatic.  Eyes:     General:        Right eye: No discharge.        Left eye: No discharge.  Neck:     Trachea: No tracheal deviation.  Cardiovascular:     Rate and Rhythm: Normal rate and regular rhythm.     Heart sounds: Normal heart sounds. No murmur heard.   Pulmonary:     Effort: Pulmonary effort is normal. No respiratory distress.     Breath sounds: Normal breath sounds.  Musculoskeletal:        General: No edema.  Lymphadenopathy:     Cervical: No cervical adenopathy.  Skin:    General: Skin is warm and dry.  Neurological:     Mental Status: She is alert.  Coordination: Coordination normal.  Psychiatric:        Mood and Affect: Mood and affect normal.        Behavior: Behavior normal.           Assessment & Plan:  1. Upper back pain Referral to physical medicine rehab I am hopeful they may be able to offer her some injections or something similar - Basic metabolic panel - CBC with Differential/Platelet - Ferritin - Iron Binding Cap (TIBC)([facility]) - C-reactive protein - Amb referral to Pediatric Physical Medicine Rehab  2. HTN (hypertension), benign Blood pressure overall good control watch salt diet stay active - Basic metabolic panel - CBC with Differential/Platelet - Ferritin - Iron Binding  Cap (TIBC)([facility]) - C-reactive protein - Amb referral to Pediatric Physical Medicine Rehab  3. Encounter for long-term opiate analgesic use The patient was seen in followup for chronic pain. A review over at their current pain status was discussed. Drug registry was checked. Prescriptions were given.  Regular follow-up recommended. Discussion was held regarding the importance of compliance with medication as well as pain medication contract.  Patient was informed that medication may cause drowsiness and should not be combined  with other medications/alcohol or street drugs. If the patient feels medication is causing altered alertness then do not drive or operate dangerous equipment.  Drug registry was checked Patient does do well with the medicine Continue this ongoing - Basic metabolic panel - CBC with Differential/Platelet - Ferritin - Iron Binding Cap (TIBC)([facility]) - C-reactive protein - Amb referral to Pediatric Physical Medicine Rehab  4. Iron deficiency anemia, unspecified iron deficiency anemia type Iron deficient anemia recommend checking lab work await results patient did not tolerate iron infusions - Basic metabolic panel - CBC with Differential/Platelet - Ferritin - Iron Binding Cap (TIBC)([facility]) - C-reactive protein - Amb referral to Pediatric Physical Medicine Rehab

## 2020-11-03 NOTE — Addendum Note (Signed)
Addended by: Vicente Males on: 11/03/2020 11:35 AM   Modules accepted: Orders

## 2020-11-03 NOTE — Progress Notes (Signed)
11/03/20- Referral updated to Physical Therapy and note put in comments that we just want her to be evaluated.

## 2020-11-03 NOTE — Telephone Encounter (Signed)
Amanda Osborne, CMA  

## 2020-11-04 ENCOUNTER — Encounter: Payer: Self-pay | Admitting: Family Medicine

## 2020-11-05 ENCOUNTER — Other Ambulatory Visit: Payer: Self-pay | Admitting: Family Medicine

## 2020-11-05 ENCOUNTER — Other Ambulatory Visit (INDEPENDENT_AMBULATORY_CARE_PROVIDER_SITE_OTHER): Payer: Self-pay

## 2020-11-06 ENCOUNTER — Other Ambulatory Visit: Payer: Self-pay | Admitting: *Deleted

## 2020-11-06 DIAGNOSIS — M549 Dorsalgia, unspecified: Secondary | ICD-10-CM

## 2020-11-09 ENCOUNTER — Encounter (HOSPITAL_COMMUNITY): Admission: RE | Admit: 2020-11-09 | Payer: Medicare Other | Source: Ambulatory Visit

## 2020-11-09 ENCOUNTER — Other Ambulatory Visit (HOSPITAL_COMMUNITY): Payer: Medicare Other

## 2020-11-09 ENCOUNTER — Encounter: Payer: Self-pay | Admitting: Physical Medicine & Rehabilitation

## 2020-11-09 ENCOUNTER — Other Ambulatory Visit: Payer: Self-pay | Admitting: *Deleted

## 2020-11-09 DIAGNOSIS — M542 Cervicalgia: Secondary | ICD-10-CM

## 2020-11-09 DIAGNOSIS — G8929 Other chronic pain: Secondary | ICD-10-CM

## 2020-11-09 DIAGNOSIS — M25519 Pain in unspecified shoulder: Secondary | ICD-10-CM

## 2020-11-09 NOTE — Progress Notes (Signed)
amb ref pain managemern

## 2020-11-11 ENCOUNTER — Ambulatory Visit (HOSPITAL_COMMUNITY): Admission: RE | Admit: 2020-11-11 | Payer: Medicare Other | Source: Home / Self Care | Admitting: Internal Medicine

## 2020-11-11 ENCOUNTER — Encounter (HOSPITAL_COMMUNITY): Admission: RE | Payer: Self-pay | Source: Home / Self Care

## 2020-11-11 SURGERY — COLONOSCOPY WITH PROPOFOL
Anesthesia: Monitor Anesthesia Care

## 2020-12-03 ENCOUNTER — Other Ambulatory Visit: Payer: Medicare Other

## 2020-12-03 DIAGNOSIS — Z20822 Contact with and (suspected) exposure to covid-19: Secondary | ICD-10-CM

## 2020-12-04 ENCOUNTER — Encounter: Payer: Medicare Other | Admitting: Physical Medicine & Rehabilitation

## 2020-12-06 LAB — NOVEL CORONAVIRUS, NAA: SARS-CoV-2, NAA: NOT DETECTED

## 2020-12-17 ENCOUNTER — Other Ambulatory Visit: Payer: Self-pay

## 2020-12-17 ENCOUNTER — Encounter: Payer: Medicare Other | Attending: Physical Medicine & Rehabilitation | Admitting: Physical Medicine & Rehabilitation

## 2020-12-17 ENCOUNTER — Encounter: Payer: Self-pay | Admitting: Physical Medicine & Rehabilitation

## 2020-12-17 VITALS — BP 127/82 | HR 89 | Temp 97.8°F | Ht 66.5 in | Wt 184.4 lb

## 2020-12-17 DIAGNOSIS — M7918 Myalgia, other site: Secondary | ICD-10-CM | POA: Diagnosis not present

## 2020-12-17 DIAGNOSIS — M5481 Occipital neuralgia: Secondary | ICD-10-CM

## 2020-12-17 NOTE — Patient Instructions (Signed)
Shoulder Impingement Syndrome Rehab Ask your health care provider which exercises are safe for you. Do exercises exactly as told by your health care provider and adjust them as directed. It is normal to feel mild stretching, pulling, tightness, or discomfort as you do these exercises. Stop right away if you feel sudden pain or your pain gets worse. Do not begin these exercises until told by your health care provider. Stretching and range-of-motion exercise This exercise warms up your muscles and joints and improves the movement and flexibility of your shoulder. This exercise also helps to relieve pain and stiffness. Passive horizontal adduction In passive adduction, you use your other hand to move the injured arm toward your body. The injured arm does not move on its own. In this movement, your arm is moved across your body in the horizontal plane (horizontal adduction). 1. Sit or stand and pull your left / right elbow across your chest, toward your other shoulder. Stop when you feel a gentle stretch in the back of your shoulder and upper arm. ? Keep your arm at shoulder height. ? Keep your arm as close to your body as you comfortably can. 2. Hold for __________ seconds. 3. Slowly return to the starting position. Repeat __________ times. Complete this exercise __________ times a day.   Strengthening exercises These exercises build strength and endurance in your shoulder. Endurance is the ability to use your muscles for a long time, even after they get tired. External rotation, isometric This is an exercise in which you press the back of your wrist against a door frame without moving your shoulder joint (isometric). 1. Stand or sit in a doorway, facing the door frame. 2. Bend your left / right elbow and place the back of your wrist against the door frame. Only the back of your wrist should be touching the frame. Keep your upper arm at your side. 3. Gently press your wrist against the door frame, as  if you are trying to push your arm away from your abdomen (external rotation). Press as hard as you are able without pain. ? Avoid shrugging your shoulder while you press your wrist against the door frame. Keep your shoulder blade tucked down toward the middle of your back. 4. Hold for __________ seconds. 5. Slowly release the tension, and relax your muscles completely before you repeat the exercise. Repeat __________ times. Complete this exercise __________ times a day. Internal rotation, isometric This is an exercise in which you press your palm against a door frame without moving your shoulder joint (isometric). 1. Stand or sit in a doorway, facing the door frame. 2. Bend your left / right elbow and place the palm of your hand against the door frame. Only your palm should be touching the frame. Keep your upper arm at your side. 3. Gently press your hand against the door frame, as if you are trying to push your arm toward your abdomen (internal rotation). Press as hard as you are able without pain. ? Avoid shrugging your shoulder while you press your hand against the door frame. Keep your shoulder blade tucked down toward the middle of your back. 4. Hold for __________ seconds. 5. Slowly release the tension, and relax your muscles completely before you repeat the exercise. Repeat __________ times. Complete this exercise __________ times a day.   Scapular protraction, supine 1. Lie on your back on a firm surface (supine position). Hold a __________ weight in your left / right hand. 2. Raise your left /  right arm straight into the air so your hand is directly above your shoulder joint. 3. Push the weight into the air so your shoulder (scapula) lifts off the surface that you are lying on. The scapula will push up or forward (protraction). Do not move your head, neck, or back. 4. Hold for __________ seconds. 5. Slowly return to the starting position. Let your muscles relax completely before you  repeat this exercise. Repeat __________ times. Complete this exercise __________ times a day.   Scapular retraction 1. Sit in a stable chair without armrests, or stand up. 2. Secure an exercise band to a stable object in front of you so the band is at shoulder height. 3. Hold one end of the exercise band in each hand. Your palms should face down. 4. Squeeze your shoulder blades together (retraction) and move your elbows slightly behind you. Do not shrug your shoulders upward while you do this. 5. Hold for __________ seconds. 6. Slowly return to the starting position. Repeat __________ times. Complete this exercise __________ times a day.   Shoulder extension 1. Sit in a stable chair without armrests, or stand up. 2. Secure an exercise band to a stable object in front of you so the band is above shoulder height. 3. Hold one end of the exercise band in each hand. 4. Straighten your elbows and lift your hands up to shoulder height. 5. Squeeze your shoulder blades together and pull your hands down to the sides of your thighs (extension). Stop when your hands are straight down by your sides. Do not let your hands go behind your body. 6. Hold for __________ seconds. 7. Slowly return to the starting position. Repeat __________ times. Complete this exercise __________ times a day.   This information is not intended to replace advice given to you by your health care provider. Make sure you discuss any questions you have with your health care provider. Document Revised: 03/01/2019 Document Reviewed: 12/03/2018 Elsevier Patient Education  2021 [employer].

## 2020-12-17 NOTE — Progress Notes (Signed)
Subjective:    Patient ID: Amanda Osborne, female    DOB: 1951/07/02, 70 y.o.   MRN: [id number]  HPI  Pt is a 70 yo female referred by her primary care physician Dr. Wolfgang Phoenix for evaluation of shoulder pain.  The patient states that the main area that bothers her is her posterior neck area as well as upper back area.  She has had problems with this since the year 2000.  The patient has had C6-7 in 2000 and C5-6 in 2014 ACDF per Dr. Kristeen Miss. Pt usually has a couple "flare ups"  Per year usually relieved with prednisone dose pack  Was also treated for pericarditis with indocin and colchicine, never had Covid, also had vaccine Has been tested several times for RA and has been reportedly negative.  Symptoms have worsened since May this year  The patient was referred to neurology and tried Botox, which the patient described as "horrible experience"  became very weak in neck muscles and had difficulty holding up her head.  Has had good relief with trigger point injections and occipital nerve blocks when she used to go to pain management in the past  Pain Inventory Average Pain 3 Pain Right Now 4 My pain is constant, sharp, burning, dull, aching and throb  In the last 24 hours, has pain interfered with the following? General activity 4 Relation with others 4 Enjoyment of life 6 What TIME of day is your pain at its worst? evening and night Sleep (in general) Good  Pain is worse with: sitting and using cell phone, driving & laptop use Pain improves with: rest, medication and injections Relief from Meds: 8  ability to climb steps?  yes do you drive?  yes  retired  bowel control problems weakness numbness tingling spasms anxiety  Any changes since last visit?  no XRAY AT [facility] IN NOV 2021  Any changes since last visit?  no    Family History  Problem Relation Age of Onset  . Stroke Mother   . Heart disease Mother   . Diabetes Mother   . Heart disease Father   .  Cancer - Colon Father   . Other Brother    Social History   Socioeconomic History  . Marital status: Divorced    Spouse name: Not on file  . Number of children: 1  . Years of education: HS  . Highest education level: Not on file  Occupational History  . Occupation: retired  Tobacco Use  . Smoking status: Never Smoker  . Smokeless tobacco: Never Used  Vaping Use  . Vaping Use: Never used  Substance and Sexual Activity  . Alcohol use: No  . Drug use: No  . Sexual activity: Not Currently    Birth control/protection: Post-menopausal  Other Topics Concern  . Not on file  Social History Narrative   Patient lives at home alone.   Caffeine Use: daily (BC podwder)   Social Determinants of Health   Financial Resource Strain: Not on file  Food Insecurity: Not on file  Transportation Needs: Not on file  Physical Activity: Not on file  Stress: Not on file  Social Connections: Not on file   Past Surgical History:  Procedure Laterality Date  . ANTERIOR CERVICAL DECOMP/DISCECTOMY FUSION  2000   C6-C7 DR Ellene Route   . ANTERIOR CERVICAL DECOMP/DISCECTOMY FUSION N/A 08/05/2013   Procedure: Cervical five-six Anterior cervical decompression/diskectomy/fusion/ Synthes plate removal;  Surgeon: Kristeen Miss, MD;  Location: [facility];  Service:  Neurosurgery;  Laterality: N/A;  . BONE GRAFT HIP ILIAC CREST  2006   to right wrist to correct fracture, DR. Hana ORTHO   . COLONOSCOPY     Hx; of  . DILATION AND CURETTAGE OF UTERUS    . ESOPHAGEAL DILATION N/A 10/22/2015   Procedure: ESOPHAGEAL DILATION;  Surgeon: Rogene Houston, MD;  Location: [facility];  Service: Endoscopy;  Laterality: N/A;  . ESOPHAGOGASTRODUODENOSCOPY N/A 08/28/2015   Procedure: ESOPHAGOGASTRODUODENOSCOPY (EGD);  Surgeon: Rogene Houston, MD;  Location: [facility];  Service: Endoscopy;  Laterality: N/A;  910am  . ESOPHAGOGASTRODUODENOSCOPY N/A 10/22/2015   Procedure: ESOPHAGOGASTRODUODENOSCOPY (EGD);  Surgeon:  Rogene Houston, MD;  Location: [facility];  Service: Endoscopy;  Laterality: N/A;  12:00  . FRACTURE SURGERY     Hx: of right wrist surgery  . GASTRIC BYPASS  1981  . INCISIONAL HERNIA REPAIR N/A 06/14/2018   Procedure: LAPAROSCOPIC INCISIONAL HERNIA REPAIR WITH INSERTION OF MESH ERAS PATHWAY;  Surgeon: Kinsinger, Arta Bruce, MD;  Location: [facility];  Service: General;  Laterality: N/A;  . TONSILLECTOMY     Past Medical History:  Diagnosis Date  . Anxiety   . Arthritis   . Cervical disc herniation    c5 and c6  . Cervicogenic headache 05/13/2019  . Chronic pain   . Complication of anesthesia   . Fibromyalgia   . GERD (gastroesophageal reflux disease)   . Headache(784.0)    Hx: of migraines until age 30's  . Hyperlipidemia   . Hypertension   . Iron deficiency anemia 11/30/2012  . Left carpal tunnel syndrome 11/07/2018  . Narcolepsy   . Occipital neuralgia   . PONV (postoperative nausea and vomiting)    DURING FIRST NECK SURGERY ; BUT REPORT IT MAY HAVE BEEN THE DILAUDID THAT CAUSED THE VOITING I  . Unspecified vitamin D deficiency 03/26/2013  . Wrist fracture, bilateral 2005 and 2010   right then left   There were no vitals taken for this visit.  Opioid Risk Score:   Fall Risk Score:  `1  Depression screen PHQ 2/9  Depression screen [facility] 2/9 11/02/2020 03/16/2020 12/25/2018 03/26/2018 12/30/2016 10/20/2015 03/17/2014  Decreased Interest 2 3 1 1  0 0 0  Down, Depressed, Hopeless 1 0 0 1 0 0 0  PHQ - 2 Score 3 3 1 2  0 0 0  Altered sleeping 2 0 0 0 - - -  Tired, decreased energy 2 0 1 2 - - -  Change in appetite 1 0 0 0 - - -  Feeling bad or failure about yourself  0 0 0 0 - - -  Trouble concentrating 1 0 0 0 - - -  Moving slowly or fidgety/restless 0 0 0 0 - - -  Suicidal thoughts 0 0 0 0 - - -  PHQ-9 Score 9 3 2 4  - - -  Difficult doing work/chores Very difficult Not difficult at all Somewhat difficult - - - -  Some recent data might be hidden   Review of Systems   Gastrointestinal: Positive for constipation.       Stool leakage  Musculoskeletal: Positive for arthralgias and neck pain.       Pain in shoulders, joint pain all over  spasms  Neurological: Positive for headaches.  Hematological: Bruises/bleeds easily.  Psychiatric/Behavioral:       ANXIETY  All other systems reviewed and are negative.      Objective:   Physical Exam Constitutional:  Appearance: She is obese.  HENT:     Head: Normocephalic and atraumatic.  Eyes:     Extraocular Movements: Extraocular movements intact.     Conjunctiva/sclera: Conjunctivae normal.     Pupils: Pupils are equal, round, and reactive to light.  Abdominal:     General: Abdomen is flat.     Palpations: Abdomen is soft.  Musculoskeletal:     Comments: There is tenderness to palpation left upper trapezius as well as left levator scapula insertion  Cervical spine range of motion is 50% flexion extension lateral bending and rotation  Skin:    General: Skin is warm and dry.  Neurological:     Mental Status: She is alert and oriented to person, place, and time.     Cranial Nerves: No dysarthria.     Sensory: No sensory deficit.     Motor: No weakness or abnormal muscle tone.     Coordination: Coordination normal. Finger-Nose-Finger Test normal.     Gait: Gait is intact.     Comments: Motor strength is 5/5 bilateral deltoid, bicep, tricep, grip, hip flexor, knee extensor, ankle dorsiflexor and plantar flexor  Negative straight leg raising  Sensation intact to light touch bilateral upper and lower limbs  Psychiatric:        Mood and Affect: Mood normal.        Behavior: Behavior normal.   Tenderness over the occipital ridge some pain that radiates in the posterior scalp area        Assessment & Plan:  #1.  Chronic neck pain cervical postlaminectomy syndrome status post C5-6 and C6-7 fusion.  The patient may have some adjacent level pain either above or below the level of the fusion  however clearly does have myofascial pain syndrome in the periscapular musculature.  Has responded well to trigger point injections in the past we will do injections today  Trigger Point Injection  Indication: Left trapezius and left levator scapula myofascial pain not relieved by medication management and other conservative care.  Informed consent was obtained after describing risk and benefits of the procedure with the patient, this includes bleeding, bruising, infection and medication side effects.  The patient wishes to proceed and has given written consent.  The patient was placed in a seated position.  The left trapezius and left levator scapula area was marked and prepped with Betadine.  It was entered with a 25-gauge 1-1/2 inch needle and 1 mL of 1% lidocaine was injected into each of 2 trigger points, after negative draw back for blood.  The patient tolerated the procedure well.  Post procedure instructions were given.  #2.  Occipital neuralgia is responded well to occipital nerve injections in the past we will schedule she is already taking narcotic analgesics has tried nonnarcotic medications and has multiple allergies including gabapentin and topiramate

## 2020-12-22 ENCOUNTER — Other Ambulatory Visit: Payer: Self-pay | Admitting: Neurology

## 2020-12-22 DIAGNOSIS — R072 Precordial pain: Secondary | ICD-10-CM | POA: Diagnosis not present

## 2020-12-22 DIAGNOSIS — E7849 Other hyperlipidemia: Secondary | ICD-10-CM | POA: Diagnosis not present

## 2020-12-22 DIAGNOSIS — I10 Essential (primary) hypertension: Secondary | ICD-10-CM | POA: Diagnosis not present

## 2020-12-22 DIAGNOSIS — I313 Pericardial effusion (noninflammatory): Secondary | ICD-10-CM | POA: Diagnosis not present

## 2020-12-22 MED ORDER — NORTRIPTYLINE HCL 10 MG PO CAPS
20.0000 mg | ORAL_CAPSULE | Freq: Every day | ORAL | 1 refills | Status: DC
Start: 2020-12-22 — End: 2021-06-17

## 2021-01-09 ENCOUNTER — Other Ambulatory Visit: Payer: Self-pay | Admitting: Family Medicine

## 2021-01-12 ENCOUNTER — Other Ambulatory Visit: Payer: Self-pay | Admitting: Family Medicine

## 2021-01-12 ENCOUNTER — Telehealth: Payer: Self-pay | Admitting: Family Medicine

## 2021-01-12 MED ORDER — LEVOTHYROXINE SODIUM 25 MCG PO TABS
200.0000 ug | ORAL_TABLET | ORAL | 1 refills | Status: DC
Start: 2021-01-16 — End: 2021-02-01

## 2021-01-12 NOTE — Telephone Encounter (Signed)
I am fine with 8 tablets on Saturday May send in 1 month supply with 6 refills

## 2021-01-12 NOTE — Telephone Encounter (Signed)
Last filled by cardiology on 11/29. Pharm calling stating they last filled in December from dr scott. 0.025 take 8 tablets on thursdays. Two different directions on med list.

## 2021-01-12 NOTE — Telephone Encounter (Signed)
Prescription sent electronically to pharmacy. Patient notified. 

## 2021-01-12 NOTE — Telephone Encounter (Signed)
Called pt and she states she takes 8 tablets on saturdays

## 2021-01-12 NOTE — Telephone Encounter (Signed)
Patient is requesting refill on levothyroxine 25 mg called into [facility] [address]

## 2021-01-15 ENCOUNTER — Encounter: Payer: Medicare Other | Attending: Physical Medicine & Rehabilitation | Admitting: Physical Medicine & Rehabilitation

## 2021-01-15 ENCOUNTER — Other Ambulatory Visit: Payer: Self-pay

## 2021-01-15 ENCOUNTER — Encounter: Payer: Self-pay | Admitting: Physical Medicine & Rehabilitation

## 2021-01-15 VITALS — BP 158/94 | HR 99 | Temp 98.2°F | Ht 65.5 in | Wt 184.4 lb

## 2021-01-15 DIAGNOSIS — M5481 Occipital neuralgia: Secondary | ICD-10-CM

## 2021-01-15 NOTE — Progress Notes (Signed)
Left occipital nerve block Indication left occipital neuralgia Pain is only partially response to medication management of the conservative care Occipital protuberance palpated prepped with Betadine and entered with a 30-gauge 1/2 inch needle, 1/2 ml of 1% lidocaine injected.Then with a 30 gauge 0.5 inch needle a solution of .5cc of Depo-Medrol 40 mg per cc +2 cc of 1% lidocaine injected in a fan-like pattern. Patient tolerated procedure well  RIght  occipital nerve block Indication left occipital neuralgia Pain is only partially response to medication management of the conservative care Occipital protuberance palpated prepped with Betadine and entered with a 30-gauge 1/2 inch needle, 1/2 ml of 1% lidocaine injected.Then with a 30 gauge 0.5 inch needle a solution of .5cc of Depo-Medrol 40 mg per cc +2 cc of 1% lidocaine injected in a fan-like pattern. Patient tolerated procedure well

## 2021-01-15 NOTE — Patient Instructions (Addendum)
Occipital Nerve Block Patient Information  Description: The occipital nerves originate in the cervical (neck) spinal cord and travel upward through muscle and tissue to supply sensation to the back of the head and top of the scalp.  In addition, the nerves control some of the muscles of the scalp.  Occipital neuralgia is an irritation of these nerves which can cause headaches, numbness of the scalp, and neck discomfort.     The occipital nerve block will interrupt nerve transmission through these nerves and can relieve pain and spasm.  The block consists of insertion of a small needle under the skin in the back of the head to deposit local anesthetic (numbing medicine) and/or steroids around the nerve.  The entire block usually lasts less than 5 minutes.  Conditions which may be treated by occipital blocks:   Muscular pain and spasm of the scalp  Nerve irritation, back of the head  Headaches  Upper neck pain  1. .  Possible side-effects:   Bleeding from needle site  Infection (rare, may require surgery)  Nerve injury (rare)  Hair on back of neck can be tinged with iodine scrub (this will wash out)  Light-headedness (temporary)  Pain at injection site (several days)  Decreased blood pressure (rare, temporary)  Seizure (very rare)  Call if you experience:   Hives or difficulty breathing ( go to the emergency room)  Inflammation or drainage at the injection site(s)  Please note:  Although the local anesthetic injected can often make your painful muscles or headache feel good for several hours after the injection, the pain may return.  It takes 3-7 days for steroids to work.  You may not notice any pain relief for at least one week.  If effective, we will often do a series of injections spaced 3-6 weeks apart to maximally decrease your pain.

## 2021-01-17 ENCOUNTER — Encounter: Payer: Self-pay | Admitting: Family Medicine

## 2021-02-01 ENCOUNTER — Ambulatory Visit (INDEPENDENT_AMBULATORY_CARE_PROVIDER_SITE_OTHER): Payer: Medicare Other | Admitting: Family Medicine

## 2021-02-01 ENCOUNTER — Other Ambulatory Visit: Payer: Self-pay

## 2021-02-01 ENCOUNTER — Telehealth: Payer: Self-pay

## 2021-02-01 VITALS — BP 128/78 | HR 95 | Temp 97.9°F | Wt 182.8 lb

## 2021-02-01 DIAGNOSIS — M542 Cervicalgia: Secondary | ICD-10-CM

## 2021-02-01 DIAGNOSIS — Z1231 Encounter for screening mammogram for malignant neoplasm of breast: Secondary | ICD-10-CM | POA: Diagnosis not present

## 2021-02-01 DIAGNOSIS — D509 Iron deficiency anemia, unspecified: Secondary | ICD-10-CM

## 2021-02-01 DIAGNOSIS — I10 Essential (primary) hypertension: Secondary | ICD-10-CM

## 2021-02-01 DIAGNOSIS — G8929 Other chronic pain: Secondary | ICD-10-CM | POA: Diagnosis not present

## 2021-02-01 DIAGNOSIS — M545 Low back pain, unspecified: Secondary | ICD-10-CM

## 2021-02-01 DIAGNOSIS — M791 Myalgia, unspecified site: Secondary | ICD-10-CM

## 2021-02-01 DIAGNOSIS — E038 Other specified hypothyroidism: Secondary | ICD-10-CM | POA: Diagnosis not present

## 2021-02-01 DIAGNOSIS — E7849 Other hyperlipidemia: Secondary | ICD-10-CM | POA: Diagnosis not present

## 2021-02-01 DIAGNOSIS — M549 Dorsalgia, unspecified: Secondary | ICD-10-CM | POA: Diagnosis not present

## 2021-02-01 DIAGNOSIS — Z79891 Long term (current) use of opiate analgesic: Secondary | ICD-10-CM

## 2021-02-01 DIAGNOSIS — F411 Generalized anxiety disorder: Secondary | ICD-10-CM

## 2021-02-01 MED ORDER — OXYCODONE HCL 5 MG PO TABS: 5.0000 mg | ORAL_TABLET | ORAL | 0 refills | Status: DC | PRN

## 2021-02-01 MED ORDER — LEVOTHYROXINE SODIUM 25 MCG PO TABS: 200.0000 ug | ORAL_TABLET | ORAL | 1 refills | Status: DC

## 2021-02-01 MED ORDER — LISINOPRIL 2.5 MG PO TABS: 2.5000 mg | ORAL_TABLET | Freq: Every day | ORAL | 1 refills | Status: DC

## 2021-02-01 MED ORDER — POTASSIUM CHLORIDE ER 10 MEQ PO TBCR: 10.0000 meq | EXTENDED_RELEASE_TABLET | Freq: Every day | ORAL | 1 refills | Status: DC

## 2021-02-01 MED ORDER — FLUTICASONE PROPIONATE 50 MCG/ACT NA SUSP: NASAL | 12 refills | Status: DC

## 2021-02-01 MED ORDER — OXYCODONE HCL 5 MG PO TABS: ORAL_TABLET | ORAL | 0 refills | Status: DC

## 2021-02-01 MED ORDER — ALPRAZOLAM 0.5 MG PO TABS: ORAL_TABLET | ORAL | 5 refills | Status: DC

## 2021-02-01 MED ORDER — PRAVASTATIN SODIUM 20 MG PO TABS: ORAL_TABLET | ORAL | 1 refills | Status: DC

## 2021-02-01 NOTE — Progress Notes (Signed)
Subjective:    Patient ID: Amanda Osborne, female    DOB: [birth year], 70 y.o.   MRN: [id number]  HPI This patient was seen today for chronic pain  The medication list was reviewed and updated.   -Compliance with medication: yes  - Number patient states they take daily: 6 a day  -when was the last dose patient took? Today  The patient was advised the importance of maintaining medication and not using illegal substances with these.  Here for refills and follow up  The patient was educated that we can provide 3 monthly scripts for their medication, it is their responsibility to follow the instructions.  Side effects or complications from medications: none  Patient is aware that pain medications are meant to minimize the severity of the pain to allow their pain levels to improve to allow for better function. They are aware of that pain medications cannot totally remove their pain.  Due for UDT ( at least once per year) : 08/05/20  Discuss specialist visit and nerve block- still having muscle spasms Dr Elnita Maxwell does the nerve block Apparently appealed and was denied Uses this injection for cervical myelopathy and nerve impingement  Severe upper back pain and spasms into the mid and upper back Severe pain for several months Pain radiates bilateral  Working hard on moving to a healthier diet to lessen inflamation  Will lean toward being on advil ongoing rather than indomethacin due to indocin would be harder on the kidney  Colonoscopy- will  Be going to [facility]     Review of Systems  Constitutional: Negative for activity change and appetite change.  HENT: Negative for congestion and rhinorrhea.   Respiratory: Negative for cough and shortness of breath.   Cardiovascular: Negative for chest pain and leg swelling.  Gastrointestinal: Negative for abdominal pain, nausea and vomiting.  Musculoskeletal: Positive for arthralgias and back pain.  Skin: Negative for color  change.  Neurological: Negative for dizziness and weakness.  Psychiatric/Behavioral: Negative for agitation and confusion.       Objective:   Physical Exam Vitals reviewed.  Constitutional:      General: She is not in acute distress. HENT:     Head: Normocephalic and atraumatic.  Eyes:     General:        Right eye: No discharge.        Left eye: No discharge.  Neck:     Trachea: No tracheal deviation.  Cardiovascular:     Rate and Rhythm: Normal rate and regular rhythm.     Heart sounds: Normal heart sounds. No murmur heard.   Pulmonary:     Effort: Pulmonary effort is normal. No respiratory distress.     Breath sounds: Normal breath sounds.  Lymphadenopathy:     Cervical: No cervical adenopathy.  Skin:    General: Skin is warm and dry.  Neurological:     Mental Status: She is alert.     Coordination: Coordination normal.  Psychiatric:        Behavior: Behavior normal.           Assessment & Plan:  1. Neck pain Patient has severe cervical disc issues with nerve impingement.  It is medically necessary for her to get injections to help her with the pain.  Pain medicine and not is not helping.  Patient is not a surgical candidate.  Additionally injections were denied but our hopes is that Repeat filing by Dr.Kirstein you will be approved Patient will  let us know if she needs to have any type of letter - DG Thoracic Spine W/Swimmers - C-reactive protein - Lipid Profile - Ferritin - Iron Binding Cap (TIBC)([facility]) - CBC with Differential - Basic Metabolic Panel (BMET) - TSH  2. Upper back pain She does have upper back pain that has been going on for several months radiates into both shoulders and the base of the neck.  It is best for her to go ahead and have thoracic spine x-rays she may well need MRI of the thoracic spine. - DG Thoracic Spine W/Swimmers - C-reactive protein - Lipid Profile - Ferritin - Iron Binding Cap (TIBC)([facility]) -  CBC with Differential - Basic Metabolic Panel (BMET) - TSH  3. HTN (hypertension), benign Blood pressure good control continue current measures. - DG Thoracic Spine W/Swimmers - C-reactive protein - Lipid Profile - Ferritin - Iron Binding Cap (TIBC)([facility]) - CBC with Differential - Basic Metabolic Panel (BMET) - TSH  4. Encounter for long-term opiate analgesic use The patient was seen in followup for chronic pain. A review over at their current pain status was discussed. Drug registry was checked. Prescriptions were given.  Regular follow-up recommended. Discussion was held regarding the importance of compliance with medication as well as pain medication contract.  Patient was informed that medication may cause drowsiness and should not be combined  with other medications/alcohol or street drugs. If the patient feels medication is causing altered alertness then do not drive or operate dangerous equipment.  3 prescriptions written.  Patient does take the medicine.  It does help her with her chronic neck and low back pain.  She does not abuse the medicine.  She has been on Xanax for years.  We have encouraged her to try to taper down.  We have actually tapered her all the way down to 0.5 mg twice daily.  I have told her that it is not advisable to be on muscle relaxers with this regimen and therefore muscle relaxers were discontinued.  Ideally the patient would not be on Xanax but both of these medicines oxycodone and Xanax have been present for years and she is tolerating them it is not causing drowsiness we will maintain at the current level - DG Thoracic Spine W/Swimmers - C-reactive protein - Lipid Profile - Ferritin - Iron Binding Cap (TIBC)([facility]) - CBC with Differential - Basic Metabolic Panel (BMET) - TSH  5. Other specified hypothyroidism Has hypothyroidism takes her medication check TSH - DG Thoracic Spine W/Swimmers - C-reactive protein - Lipid  Profile - Ferritin - Iron Binding Cap (TIBC)([facility]) - CBC with Differential - Basic Metabolic Panel (BMET) - TSH  6. Other hyperlipidemia Has hyperlipidemia does not tolerate high-dose statins will continue statin - DG Thoracic Spine W/Swimmers - C-reactive protein - Lipid Profile - Ferritin - Iron Binding Cap (TIBC)([facility]) - CBC with Differential - Basic Metabolic Panel (BMET) - TSH  7. Iron deficiency anemia, unspecified iron deficiency anemia type Patient has history of iron deficient anemia due to gastric bypass check lab work await results - Veterinary surgeon Spine W/Swimmers - C-reactive protein - Lipid Profile - Ferritin - Iron Binding Cap (TIBC)([facility]) - CBC with Differential - Basic Metabolic Panel (BMET) - TSH  8. Generalized anxiety disorder Xanax use as per above patient has tried antidepressants in the past and it did not help - DG Thoracic Spine W/Swimmers - C-reactive protein - Lipid Profile - Ferritin - Iron Binding Cap (TIBC)([facility]) - CBC with Differential - Basic Metabolic Panel (BMET) -  TSH  9. Chronic bilateral low back pain, unspecified whether sciatica present Pain medication as per above - DG Thoracic Spine W/Swimmers - C-reactive protein - Lipid Profile - Ferritin - Iron Binding Cap (TIBC)([facility]) - CBC with Differential - Basic Metabolic Panel (BMET) - TSH  10. Myalgia Patient relates occasional muscle aches in the back and upper back that is when she was use of muscle relaxants for I recommended massage instead - DG Thoracic Spine W/Swimmers - C-reactive protein - Lipid Profile - Ferritin - Iron Binding Cap (TIBC)([facility]) - CBC with Differential - Basic Metabolic Panel (BMET) - TSH  11. Screening mammogram for breast cancer Mammogram recommended follow-up - MM 3D SCREEN BREAST BILATERAL Follow-up 3 months

## 2021-02-01 NOTE — Telephone Encounter (Signed)
Dr Letta Pate [facility] denied for headache diagnosis - your diagnosis is BIL Occipital Neuralgia.  I sent original medical records - your documentations doesn't mention headache only cervical pain and Occ. Neuralgia.  I have resent same medical records with statement about the diagnosis we sent originally -- spoke with patient also to advise.

## 2021-02-04 ENCOUNTER — Other Ambulatory Visit: Payer: Self-pay | Admitting: Family Medicine

## 2021-02-11 ENCOUNTER — Ambulatory Visit (HOSPITAL_COMMUNITY)
Admission: RE | Admit: 2021-02-11 | Discharge: 2021-02-11 | Disposition: A | Discharge status: Home / Self Care | Payer: Medicare Other | Source: Ambulatory Visit | Attending: Family Medicine | Admitting: Family Medicine

## 2021-02-11 ENCOUNTER — Other Ambulatory Visit: Payer: Self-pay

## 2021-02-11 DIAGNOSIS — I10 Essential (primary) hypertension: Secondary | ICD-10-CM | POA: Diagnosis not present

## 2021-02-11 DIAGNOSIS — M47814 Spondylosis without myelopathy or radiculopathy, thoracic region: Secondary | ICD-10-CM | POA: Diagnosis not present

## 2021-02-11 DIAGNOSIS — M791 Myalgia, unspecified site: Secondary | ICD-10-CM | POA: Insufficient documentation

## 2021-02-11 DIAGNOSIS — M549 Dorsalgia, unspecified: Secondary | ICD-10-CM | POA: Insufficient documentation

## 2021-02-11 DIAGNOSIS — F411 Generalized anxiety disorder: Secondary | ICD-10-CM | POA: Insufficient documentation

## 2021-02-11 DIAGNOSIS — E7849 Other hyperlipidemia: Secondary | ICD-10-CM | POA: Insufficient documentation

## 2021-02-11 DIAGNOSIS — Z79891 Long term (current) use of opiate analgesic: Secondary | ICD-10-CM | POA: Insufficient documentation

## 2021-02-11 DIAGNOSIS — M545 Low back pain, unspecified: Secondary | ICD-10-CM | POA: Diagnosis not present

## 2021-02-11 DIAGNOSIS — Z9889 Other specified postprocedural states: Secondary | ICD-10-CM | POA: Diagnosis not present

## 2021-02-11 DIAGNOSIS — M542 Cervicalgia: Secondary | ICD-10-CM | POA: Insufficient documentation

## 2021-02-11 DIAGNOSIS — E038 Other specified hypothyroidism: Secondary | ICD-10-CM | POA: Diagnosis not present

## 2021-02-11 DIAGNOSIS — K449 Diaphragmatic hernia without obstruction or gangrene: Secondary | ICD-10-CM | POA: Diagnosis not present

## 2021-02-11 DIAGNOSIS — G8929 Other chronic pain: Secondary | ICD-10-CM | POA: Diagnosis not present

## 2021-02-11 DIAGNOSIS — D509 Iron deficiency anemia, unspecified: Secondary | ICD-10-CM | POA: Diagnosis not present

## 2021-02-11 DIAGNOSIS — Z1231 Encounter for screening mammogram for malignant neoplasm of breast: Secondary | ICD-10-CM | POA: Insufficient documentation

## 2021-02-15 ENCOUNTER — Other Ambulatory Visit: Payer: Self-pay | Admitting: Family Medicine

## 2021-02-15 DIAGNOSIS — M816 Localized osteoporosis [Lequesne]: Secondary | ICD-10-CM

## 2021-02-19 ENCOUNTER — Other Ambulatory Visit: Payer: Self-pay

## 2021-02-19 ENCOUNTER — Ambulatory Visit (HOSPITAL_COMMUNITY)
Admission: RE | Admit: 2021-02-19 | Discharge: 2021-02-19 | Disposition: A | Discharge status: Home / Self Care | Payer: Medicare Other | Source: Ambulatory Visit | Attending: Family Medicine | Admitting: Family Medicine

## 2021-02-19 DIAGNOSIS — M816 Localized osteoporosis [Lequesne]: Secondary | ICD-10-CM | POA: Insufficient documentation

## 2021-02-19 DIAGNOSIS — M810 Age-related osteoporosis without current pathological fracture: Secondary | ICD-10-CM | POA: Diagnosis not present

## 2021-02-26 ENCOUNTER — Encounter: Payer: Medicare Other | Admitting: Physical Medicine & Rehabilitation

## 2021-03-02 ENCOUNTER — Ambulatory Visit: Payer: Medicare Other | Admitting: Family Medicine

## 2021-03-10 DIAGNOSIS — I425 Other restrictive cardiomyopathy: Secondary | ICD-10-CM | POA: Diagnosis not present

## 2021-03-10 DIAGNOSIS — I313 Pericardial effusion (noninflammatory): Secondary | ICD-10-CM | POA: Diagnosis not present

## 2021-03-10 DIAGNOSIS — R072 Precordial pain: Secondary | ICD-10-CM | POA: Diagnosis not present

## 2021-03-10 DIAGNOSIS — I351 Nonrheumatic aortic (valve) insufficiency: Secondary | ICD-10-CM | POA: Diagnosis not present

## 2021-03-11 ENCOUNTER — Other Ambulatory Visit: Payer: Self-pay

## 2021-03-11 ENCOUNTER — Encounter: Payer: Self-pay | Admitting: Family Medicine

## 2021-03-11 ENCOUNTER — Ambulatory Visit (INDEPENDENT_AMBULATORY_CARE_PROVIDER_SITE_OTHER): Payer: Medicare Other | Admitting: Family Medicine

## 2021-03-11 VITALS — BP 117/68 | HR 76 | Temp 97.2°F | Wt 186.2 lb

## 2021-03-11 DIAGNOSIS — M79642 Pain in left hand: Secondary | ICD-10-CM | POA: Diagnosis not present

## 2021-03-11 NOTE — Patient Instructions (Signed)
Wrist Pain, Adult There are many things that can cause wrist pain. Some common causes include:  An injury to the wrist.  Using the joint too much.  A condition that causes too much pressure to be put on a nerve in the wrist (carpal tunnel syndrome).  Wear and tear of the joints that happens as a person gets older (osteoarthritis).  A condition that causes swelling and stiffness in the joints (arthritis). Sometimes, the cause of wrist pain is not known. Often, the pain goes away when you follow your doctor's instructions for easing pain at home. This may include resting your wrist, icing your wrist, or using a splint or an elastic wrap for a short time. It is important to tell your doctor if your wrist pain does not go away. Follow these instructions at home: If you have a splint or elastic wrap:  Wear the splint or wrap as told by your doctor. Take it off only as told by your doctor. Ask if you can take it off for bathing.  Loosen the splint or wrap if your fingers: ? Tingle. ? Become numb. ? Turn cold and blue.  Check the skin around the splint or wrap every day. Tell your doctor about any concerns.  Keep the splint or wrap clean.  If the splint or wrap is not waterproof: ? Do not let it get wet. ? Cover it with a watertight covering when you take a bath or shower. Managing pain, stiffness, and swelling  If told, put ice on the painful area. To do this: ? If you have a removable splint or wrap, take it off as told by your doctor. ? Put ice in a plastic bag. ? Place a towel between your skin and the bag or between your splint or wrap and the bag. ? Leave the ice on for 20 minutes, 2-3 times a day.  Move your fingers often.  Raise (elevate) the injured area above the level of your heart while you are sitting or lying down.   Activity  Rest your wrist as told by your doctor.  Return to your normal activities as told by your doctor. Ask your doctor what activities are safe  for you.  Ask your doctor when it is safe to drive if you have a splint or wrap on your wrist.  Do exercises as told by your doctor. General instructions  Pay attention to any changes in your symptoms.  Take over-the-counter and prescription medicines only as told by your doctor.  Keep all follow-up visits as told by your doctor. This is important. Contact a doctor if:  You have a sudden, sharp pain in the wrist, hand, or arm that is different or new.  The swelling or bruising on your wrist or hand gets worse.  Your skin: ? Becomes red. ? Gets a rash. ? Has open sores.  Your pain does not get better.  Your pain gets worse.  You have a fever or chills. Get help right away if:  You lose feeling in your fingers or hand.  Your fingers turn white, very red, or cold and blue.  You cannot move your fingers. Summary  There are many things that can cause wrist pain.  It is important to tell your doctor if your wrist pain does not go away.  You may need to wear a splint or a wrap for a short period of time.  Return to your normal activities as told by your doctor. Ask your doctor   what activities are safe for you. This information is not intended to replace advice given to you by your health care provider. Make sure you discuss any questions you have with your health care provider. Document Revised: 09/26/2019 Document Reviewed: 09/26/2019 Elsevier Patient Education  2021 [employer] Inc.  

## 2021-03-11 NOTE — Progress Notes (Signed)
Patient ID: Amanda Osborne, female    DOB: 1951/03/05, 70 y.o.   MRN: [id number]   Chief Complaint  Patient presents with  . left hand weakness- requesting referral to specialist   Subjective:  CC: left hand feels "dead" at night  .  Presents today with a complaint of left hand pain, needing hand surgeon referral.  In 2012 experienced a fracture of the left wrist, has had issues since that time, in the past 2 months has been waking up with no feeling in the left hand, when feeling returns it has a burning sensation.  This is interfering with her quality of life/sleep.  Wishes to see the hand specialist, has seen Dr. Amedeo Plenty in the past.  Wishes to see Dr. Amedeo Plenty again.    Medical History Amanda Osborne has a past medical history of Anxiety, Arthritis, Cervical disc herniation, Cervicogenic headache (05/13/2019), Chronic pain, Complication of anesthesia, Fibromyalgia, GERD (gastroesophageal reflux disease), Headache(784.0), Hyperlipidemia, Hypertension, Iron deficiency anemia (11/30/2012), Left carpal tunnel syndrome (11/07/2018), Narcolepsy, Occipital neuralgia, PONV (postoperative nausea and vomiting), Unspecified vitamin D deficiency (03/26/2013), and Wrist fracture, bilateral (2005 and 2010).   Outpatient Encounter Medications as of 03/11/2021  Medication Sig  . ALPRAZolam (XANAX) 0.5 MG tablet TAKE 1 TABLET BY MOUTH TWICE DAILY AS NEEDED FOR ANXIETY  . colchicine 0.6 MG tablet Take 1 tablet (0.6 mg total) by mouth daily.  Marland Kitchen esomeprazole (NEXIUM) 20 MG capsule Take 20 mg by mouth daily.   . famotidine (PEPCID) 20 MG tablet TAKE 1 TABLET(20 MG) BY MOUTH TWICE DAILY (Patient taking differently: Take 20 mg by mouth 2 (two) times daily as needed for heartburn or indigestion.)  . fluticasone (FLONASE) 50 MCG/ACT nasal spray INSTILL 2 SPRAYS INTO EACH NOSTRIL ONCE DAILY  . hydrochlorothiazide (HYDRODIURIL) 25 MG tablet TAKE 1 TABLET(25 MG) BY MOUTH DAILY (Patient taking differently: Take 25 mg by mouth  daily. TAKE 1 TABLET(25 MG) BY MOUTH DAILY)  . indomethacin (INDOCIN) 50 MG capsule Take 1 capsule (50 mg total) by mouth 2 (two) times daily with a meal.  . KLOR-CON 20 MEQ packet Take 1 packet by mouth every other day.  . levothyroxine (SYNTHROID) 25 MCG tablet Take 8 tablets (200 mcg total) by mouth every Saturday.  Marland Kitchen lisinopril (ZESTRIL) 2.5 MG tablet TAKE 1 TABLET(2.5 MG) BY MOUTH DAILY  . loratadine (CLARITIN) 10 MG tablet Take 10 mg by mouth daily.  . nortriptyline (PAMELOR) 10 MG capsule Take 2 capsules (20 mg total) by mouth at bedtime.  Marland Kitchen oxyCODONE (OXY IR/ROXICODONE) 5 MG immediate release tablet Take 1 tablet (5 mg total) by mouth every 4 (four) hours as needed for severe pain.  Marland Kitchen oxyCODONE (OXY IR/ROXICODONE) 5 MG immediate release tablet Take 1 tablet po every 4 hrs prn severe pain  . oxyCODONE (OXY IR/ROXICODONE) 5 MG immediate release tablet Take 1 tablet po every 4 hrs prn severe pain  . polyethylene glycol powder (GLYCOLAX/MIRALAX) powder Take 17 g by mouth daily as needed (for constipation).  . potassium chloride (KLOR-CON) 10 MEQ tablet Take 1 tablet (10 mEq total) by mouth daily.  . pravastatin (PRAVACHOL) 20 MG tablet TAKE 1 TABLET BY MOUTH DAILY  . tiZANidine (ZANAFLEX) 4 MG tablet 1/2 tablet three times during the day, one tablet at night (Patient taking differently: Take 2-4 mg by mouth See admin instructions. Take 1/2 tablet (2 mg) three times daily then Take 1 tablet (4 mg) at night)   No facility-administered encounter medications on file as of 03/11/2021.  Review of Systems  Constitutional: Negative for chills and fever.  Respiratory: Negative for cough and shortness of breath.   Cardiovascular: Negative for chest pain.  Musculoskeletal:       Left wrist injury in past, wakes up with feeling left hand feeling dead.   Neurological: Negative for dizziness, light-headedness and headaches.     Vitals BP 117/68   Pulse 76   Temp (!) 97.2 F (36.2 C) (Oral)    Wt 186 lb 3.2 oz (84.5 kg)   SpO2 99%   BMI 30.51 kg/m   Objective:   Physical Exam Vitals reviewed.  Constitutional:      Appearance: Normal appearance.  Cardiovascular:     Rate and Rhythm: Normal rate and regular rhythm.     Heart sounds: Normal heart sounds.  Pulmonary:     Effort: Pulmonary effort is normal.     Breath sounds: Normal breath sounds.  Abdominal:     General: Bowel sounds are normal.  Musculoskeletal:     Left wrist: Deformity present. Decreased range of motion. Normal pulse.     Comments: History of left wrist fracture; pulse present  and cap refill good.   Skin:    General: Skin is warm and dry.  Neurological:     General: No focal deficit present.     Mental Status: She is alert.  Psychiatric:        Behavior: Behavior normal.      Assessment and Plan   1. Left hand pain - Ambulatory referral to Hand Surgery   Urgent referral sent to hand surgeon/Dr. Amedeo Plenty.  Patient has seen Dr. Amedeo Plenty in the past and wishes to see him for evaluation/treatment for left hand pain/losing feeling. Pulse present today with good cap refill.   Agrees with plan of care discussed today. Understands warning signs to seek further care: chest pain, shortness of breath, any significant change in health.  Understands to follow-up if symptoms worsen, do not improve, await urgent referral for Dr. Amedeo Plenty.   Pecolia Ades, NP 03/11/21

## 2021-03-18 ENCOUNTER — Encounter: Payer: Self-pay | Admitting: Physical Medicine & Rehabilitation

## 2021-03-18 ENCOUNTER — Encounter: Payer: Medicare Other | Attending: Physical Medicine & Rehabilitation | Admitting: Physical Medicine & Rehabilitation

## 2021-03-18 ENCOUNTER — Other Ambulatory Visit: Payer: Self-pay

## 2021-03-18 VITALS — BP 116/74 | HR 73 | Temp 97.9°F | Ht 65.5 in | Wt 185.0 lb

## 2021-03-18 DIAGNOSIS — M5481 Occipital neuralgia: Secondary | ICD-10-CM | POA: Diagnosis not present

## 2021-03-18 NOTE — Patient Instructions (Signed)
Please call when occipital headaches start to reoccur

## 2021-03-18 NOTE — Progress Notes (Signed)
Pt is a 70 yo female referred by her primary care physician Dr. Wolfgang Phoenix f.  The patient states that the main area that bothers her is her posterior neck area as well as upper back area.  She has had problems with this since the year 2000.  The patient has had C6-7 in 2000 and C5-6 in 2014 ACDF per Dr. Kristeen Miss. Pt usually has a couple "flare ups"  Per year usually relieved with prednisone dose pack  Was also treated for pericarditis with indocin and colchicine, never had Covid, also had vaccine Has been tested several times for RA and has been reportedly negative.   Diagnosed by Dr Jannifer Franklin with chronic migraines which caused excess neck weakness  Used to get occipital nerve blocks by Dr Hardin Negus These were helpful  Was on oxycontin 10mg  BID, in past , felt some anxiety about running out, felt she was over dependant and was weaned off,  Had ulcers and had to stop.  Oxy IR prescribed by   Sees Dr Amedeo Plenty for sequelae of Left wrist fx , EMG per Dr Jannifer Franklin showed carpal tunnel syndrome   Neuro:  Eyes without evidence of nystagmus  Tone is normal without evidence of spasticity Cerebellar exam shows no evidence of ataxia on finger nose finger or heel to shin testing No evidence of trunkal ataxia  Motor strength is 5/5 in bilateral deltoid, biceps, triceps, finger flexors and extensors, wrist flexors and extensors, hip flexors, knee flexors and extensors, ankle dorsiflexors, plantar flexors, invertors and evertors, toe flexors and extensors  Sensory exam has reduced sensation in the left third and fourth digit to light touch  Cranial nerves II- Visual fields are intact to confrontation testing, no blurring of vision III- no evidence of ptosis, upward, downward and medial gaze intact IV- no vertical diplopia or head tilt V- no facial numbness or masseter weakness VI- no pupil abduction weakness VII- no facial droop, good lid closure VII- normal auditory acuity IX- no pharygeal weakness, gag  nl X- no pharyngeal weakness, no hoarseness XI- no trap or SCM weakness XII- no glossal weakness  #1.  Impression occipital neuralgia improved after occipital nerve blocks.  As discussed with patient difficult to determine duration of response patient will call back when she starts getting posterior headaches once again. 2.  Cervical postlaminectomy syndrome no signs of cervical radiculopathy history of C5-C7 ACDF Medication management per Dr. Wolfgang Phoenix

## 2021-04-12 ENCOUNTER — Other Ambulatory Visit: Payer: Self-pay

## 2021-04-12 MED ORDER — HYDROCHLOROTHIAZIDE 25 MG PO TABS: 25.0000 mg | ORAL_TABLET | Freq: Every day | ORAL | 0 refills | Status: DC

## 2021-04-15 ENCOUNTER — Ambulatory Visit: Payer: Medicare Other | Admitting: Neurology

## 2021-05-04 DIAGNOSIS — M549 Dorsalgia, unspecified: Secondary | ICD-10-CM | POA: Diagnosis not present

## 2021-05-04 DIAGNOSIS — I10 Essential (primary) hypertension: Secondary | ICD-10-CM | POA: Diagnosis not present

## 2021-05-04 DIAGNOSIS — E038 Other specified hypothyroidism: Secondary | ICD-10-CM | POA: Diagnosis not present

## 2021-05-04 DIAGNOSIS — Z79891 Long term (current) use of opiate analgesic: Secondary | ICD-10-CM | POA: Diagnosis not present

## 2021-05-04 DIAGNOSIS — D509 Iron deficiency anemia, unspecified: Secondary | ICD-10-CM | POA: Diagnosis not present

## 2021-05-04 DIAGNOSIS — M542 Cervicalgia: Secondary | ICD-10-CM | POA: Diagnosis not present

## 2021-05-05 ENCOUNTER — Ambulatory Visit (INDEPENDENT_AMBULATORY_CARE_PROVIDER_SITE_OTHER): Payer: Medicare Other | Admitting: Family Medicine

## 2021-05-05 ENCOUNTER — Other Ambulatory Visit: Payer: Self-pay

## 2021-05-05 VITALS — BP 118/60 | HR 80 | Temp 91.9°F | Wt 192.0 lb

## 2021-05-05 DIAGNOSIS — M4712 Other spondylosis with myelopathy, cervical region: Secondary | ICD-10-CM

## 2021-05-05 DIAGNOSIS — M2550 Pain in unspecified joint: Secondary | ICD-10-CM

## 2021-05-05 DIAGNOSIS — G8929 Other chronic pain: Secondary | ICD-10-CM | POA: Diagnosis not present

## 2021-05-05 DIAGNOSIS — M545 Low back pain, unspecified: Secondary | ICD-10-CM | POA: Diagnosis not present

## 2021-05-05 DIAGNOSIS — Z79891 Long term (current) use of opiate analgesic: Secondary | ICD-10-CM

## 2021-05-05 DIAGNOSIS — I10 Essential (primary) hypertension: Secondary | ICD-10-CM

## 2021-05-05 DIAGNOSIS — F411 Generalized anxiety disorder: Secondary | ICD-10-CM

## 2021-05-05 DIAGNOSIS — E038 Other specified hypothyroidism: Secondary | ICD-10-CM

## 2021-05-05 DIAGNOSIS — D509 Iron deficiency anemia, unspecified: Secondary | ICD-10-CM | POA: Diagnosis not present

## 2021-05-05 LAB — BASIC METABOLIC PANEL
BUN/Creatinine Ratio: 38 — ABNORMAL HIGH (ref 12–28)
BUN: 18 mg/dL (ref 8–27)
CO2: 22 mmol/L (ref 20–29)
Calcium: 9.2 mg/dL (ref 8.7–10.3)
Chloride: 102 mmol/L (ref 96–106)
Creatinine, Ser: 0.48 mg/dL — ABNORMAL LOW (ref 0.57–1.00)
Glucose: 97 mg/dL (ref 65–99)
Potassium: 4.9 mmol/L (ref 3.5–5.2)
Sodium: 139 mmol/L (ref 134–144)
eGFR: 102 mL/min/{1.73_m2} (ref >59)

## 2021-05-05 LAB — CBC WITH DIFFERENTIAL/PLATELET
Basophils Absolute: 0 10*3/uL (ref 0.0–0.2)
Basos: 1 %
EOS (ABSOLUTE): 0.4 10*3/uL (ref 0.0–0.4)
Eos: 8 %
Hematocrit: 37.3 % (ref 34.0–46.6)
Hemoglobin: 11.8 g/dL (ref 11.1–15.9)
Immature Grans (Abs): 0 10*3/uL (ref 0.0–0.1)
Immature Granulocytes: 0 %
Lymphocytes Absolute: 1.1 10*3/uL (ref 0.7–3.1)
Lymphs: 23 %
MCH: 26.2 pg — ABNORMAL LOW (ref 26.6–33.0)
MCHC: 31.6 g/dL (ref 31.5–35.7)
MCV: 83 fL (ref 79–97)
Monocytes Absolute: 0.4 10*3/uL (ref 0.1–0.9)
Monocytes: 8 %
Neutrophils Absolute: 2.8 10*3/uL (ref 1.4–7.0)
Neutrophils: 60 %
Platelets: 290 10*3/uL (ref 150–450)
RBC: 4.51 x10E6/uL (ref 3.77–5.28)
RDW: 13.3 % (ref 11.7–15.4)
WBC: 4.6 10*3/uL (ref 3.4–10.8)

## 2021-05-05 LAB — IRON AND TIBC
Iron Saturation: 10 % — ABNORMAL LOW (ref 15–55)
Iron: 40 ug/dL (ref 27–139)
Total Iron Binding Capacity: 421 ug/dL (ref 250–450)
UIBC: 381 ug/dL — ABNORMAL HIGH (ref 118–369)

## 2021-05-05 LAB — TSH: TSH: 2.29 u[IU]/mL (ref 0.450–4.500)

## 2021-05-05 LAB — LIPID PANEL
Chol/HDL Ratio: 2.9 ratio (ref 0.0–4.4)
Cholesterol, Total: 180 mg/dL (ref 100–199)
HDL: 62 mg/dL (ref >39)
LDL Chol Calc (NIH): 98 mg/dL (ref 0–99)
Triglycerides: 115 mg/dL (ref 0–149)
VLDL Cholesterol Cal: 20 mg/dL (ref 5–40)

## 2021-05-05 LAB — C-REACTIVE PROTEIN: CRP: 2 mg/L (ref 0–10)

## 2021-05-05 LAB — FERRITIN: Ferritin: 13 ng/mL — ABNORMAL LOW (ref 15–150)

## 2021-05-05 MED ORDER — HYDROCHLOROTHIAZIDE 25 MG PO TABS: 25.0000 mg | ORAL_TABLET | Freq: Every day | ORAL | 0 refills | Status: DC

## 2021-05-05 MED ORDER — OXYCODONE HCL 5 MG PO TABS
ORAL_TABLET | ORAL | 0 refills | Status: DC
Start: 2021-05-05 — End: 2021-08-05

## 2021-05-05 MED ORDER — OXYCODONE HCL 5 MG PO TABS: ORAL_TABLET | ORAL | 0 refills | Status: DC

## 2021-05-05 MED ORDER — OXYCODONE HCL 5 MG PO TABS: 5.0000 mg | ORAL_TABLET | ORAL | 0 refills | Status: DC | PRN

## 2021-05-05 NOTE — Progress Notes (Signed)
Subjective:    Patient ID: Amanda Osborne, female    DOB: [birth year], 70 y.o.   MRN: [id number]  HPI This patient was seen today for chronic pain  The medication list was reviewed and updated.  Location of Pain for which the patient has been treated with regarding narcotics:   Onset of this pain:    -Compliance with medication: Oxycodone 5 mg  - Number patient states they take daily: 6  -when was the last dose patient took? 12 pm today   The patient was advised the importance of maintaining medication and not using illegal substances with these.  Here for refills and follow up  The patient was educated that we can provide 3 monthly scripts for their medication, it is their responsibility to follow the instructions.  Side effects or complications from medications: none  Patient is aware that pain medications are meant to minimize the severity of the pain to allow their pain levels to improve to allow for better function. They are aware of that pain medications cannot totally remove their pain.  Due for UDT ( at least once per year) : completed 07/2020  Scale of 1 to 10 ( 1 is least 10 is most) Your pain level without the medicine: 9 Your pain level with medication: 3  Scale 1 to 10 ( 1-helps very little, 10 helps very well) How well does your pain medication reduce your pain so you can function better through out the day? 10  Quality of the pain: Patient relates a lot of joint pain in her hands hips knees ankles She also relates a lot of cervical pain and discomfort which is persistent on a daily basis for which she does take her medicine She states medicine does allow her to function better without it she would be pretty much homebound or bedbound  Persistence of the pain: 24 7  Modifying factors:  Results for orders placed or performed in visit on 02/01/21  C-reactive protein  Result Value Ref Range   CRP 2 0 - 10 mg/L  Lipid Profile  Result Value Ref Range    Cholesterol, Total 180 100 - 199 mg/dL   Triglycerides 115 0 - 149 mg/dL   HDL 62 >39 mg/dL   VLDL Cholesterol Cal 20 5 - 40 mg/dL   LDL Chol Calc (NIH) 98 0 - 99 mg/dL   Chol/HDL Ratio 2.9 0.0 - 4.4 ratio  Ferritin  Result Value Ref Range   Ferritin 13 (L) 15 - 150 ng/mL  Iron Binding Cap (TIBC)([facility])  Result Value Ref Range   Total Iron Binding Capacity 421 250 - 450 ug/dL   UIBC 381 (H) 118 - 369 ug/dL   Iron 40 27 - 139 ug/dL   Iron Saturation 10 (L) 15 - 55 %  CBC with Differential  Result Value Ref Range   WBC 4.6 3.4 - 10.8 x10E3/uL   RBC 4.51 3.77 - 5.28 x10E6/uL   Hemoglobin 11.8 11.1 - 15.9 g/dL   Hematocrit 37.3 34.0 - 46.6 %   MCV 83 79 - 97 fL   MCH 26.2 (L) 26.6 - 33.0 pg   MCHC 31.6 31.5 - 35.7 g/dL   RDW 13.3 11.7 - 15.4 %   Platelets 290 150 - 450 x10E3/uL   Neutrophils 60 Not Estab. %   Lymphs 23 Not Estab. %   Monocytes 8 Not Estab. %   Eos 8 Not Estab. %   Basos 1 Not Estab. %   Neutrophils  Absolute 2.8 1.4 - 7.0 x10E3/uL   Lymphocytes Absolute 1.1 0.7 - 3.1 x10E3/uL   Monocytes Absolute 0.4 0.1 - 0.9 x10E3/uL   EOS (ABSOLUTE) 0.4 0.0 - 0.4 x10E3/uL   Basophils Absolute 0.0 0.0 - 0.2 x10E3/uL   Immature Granulocytes 0 Not Estab. %   Immature Grans (Abs) 0.0 0.0 - 0.1 [id number]/[id number]  Basic Metabolic Panel (BMET)  Result Value Ref Range   Glucose 97 65 - 99 mg/dL   BUN 18 8 - 27 mg/dL   Creatinine, Ser 0.48 (L) 0.57 - 1.00 mg/dL   eGFR 102 >59 mL/min/1.73   BUN/Creatinine Ratio 38 (H) 12 - 28   Sodium 139 134 - 144 mmol/L   Potassium 4.9 3.5 - 5.2 mmol/L   Chloride 102 96 - 106 mmol/L   CO2 22 20 - 29 mmol/L   Calcium 9.2 8.7 - 10.3 mg/dL  TSH  Result Value Ref Range   TSH 2.290 0.450 - 4.500 uIU/mL    She does have iron deficiency cannot afford iron infusion takes iron tablets on a regular basis unfortunately does not absorb them well   Denies being depressed currently finds himself anxious a lot but does the best she can with the  medicine Does take her thyroid medicine regular basis not having trouble    Review of Systems     Objective:   Physical Exam General-in no acute distress Eyes-no discharge Lungs-respiratory rate normal, CTA CV-no murmurs,RRR Extremities skin warm dry no edema Neuro grossly normal Behavior normal, alert Does have a subjective discomfort in her hands elbows neck No edema in the legs Moderate obesity in the mid abdomen        Assessment & Plan:  1. Encounter for long-term opiate analgesic use The patient was seen in followup for chronic pain. A review over at their current pain status was discussed. Drug registry was checked. Prescriptions were given.  Regular follow-up recommended. Discussion was held regarding the importance of compliance with medication as well as pain medication contract.  Patient was informed that medication may cause drowsiness and should not be combined  with other medications/alcohol or street drugs. If the patient feels medication is causing altered alertness then do not drive or operate dangerous equipment. Drug registry checked 3 scripts sent in medication does benefit her benefits outweigh the negatives patient would like to continue  2. Other specified hypothyroidism She takes her thyroid medicine regular basis recent lab work looks good  3. Arthralgia, unspecified joint C-reactive protein is normal more than likely this is just osteoarthritis she will utilize Indocin or ibuprofen sparingly to help  4. HTN (hypertension), benign Blood pressure good continue current measures watch diet  5. Spondylosis, cervical, with myelopathy C5-6 May continue pain medicine as per above  6. Iron deficiency anemia, unspecified iron deficiency anemia type Does not have any iron deficient anemia per se right at the moment but does have iron deficiency she cannot afford iron infusion continue oral iron  7. Generalized anxiety disorder Xanax she is utilizing  overall doing well with this I have encouraged her to try to minimize this as best as possible she has been on this for years its been very difficult to wean down  8. Chronic bilateral low back pain, unspecified whether sciatica present See above pain medicine should help I have encouraged her to stay away from muscle relaxers

## 2021-06-17 ENCOUNTER — Other Ambulatory Visit: Payer: Self-pay | Admitting: Family Medicine

## 2021-06-17 ENCOUNTER — Other Ambulatory Visit: Payer: Self-pay | Admitting: *Deleted

## 2021-06-17 DIAGNOSIS — M67431 Ganglion, right wrist: Secondary | ICD-10-CM | POA: Diagnosis not present

## 2021-06-17 DIAGNOSIS — G5602 Carpal tunnel syndrome, left upper limb: Secondary | ICD-10-CM | POA: Diagnosis not present

## 2021-06-17 DIAGNOSIS — M79642 Pain in left hand: Secondary | ICD-10-CM | POA: Insufficient documentation

## 2021-06-17 DIAGNOSIS — M19039 Primary osteoarthritis, unspecified wrist: Secondary | ICD-10-CM | POA: Diagnosis not present

## 2021-06-17 MED ORDER — NORTRIPTYLINE HCL 10 MG PO CAPS: 20.0000 mg | ORAL_CAPSULE | Freq: Every day | ORAL | 0 refills | Status: DC

## 2021-06-18 DIAGNOSIS — M19039 Primary osteoarthritis, unspecified wrist: Secondary | ICD-10-CM | POA: Insufficient documentation

## 2021-06-18 DIAGNOSIS — G5602 Carpal tunnel syndrome, left upper limb: Secondary | ICD-10-CM | POA: Insufficient documentation

## 2021-07-07 DIAGNOSIS — I10 Essential (primary) hypertension: Secondary | ICD-10-CM | POA: Diagnosis not present

## 2021-07-07 DIAGNOSIS — G5602 Carpal tunnel syndrome, left upper limb: Secondary | ICD-10-CM | POA: Diagnosis not present

## 2021-07-07 DIAGNOSIS — I313 Pericardial effusion (noninflammatory): Secondary | ICD-10-CM | POA: Diagnosis not present

## 2021-07-07 DIAGNOSIS — R072 Precordial pain: Secondary | ICD-10-CM | POA: Diagnosis not present

## 2021-07-07 DIAGNOSIS — K219 Gastro-esophageal reflux disease without esophagitis: Secondary | ICD-10-CM | POA: Diagnosis not present

## 2021-07-07 DIAGNOSIS — M150 Primary generalized (osteo)arthritis: Secondary | ICD-10-CM | POA: Diagnosis not present

## 2021-07-22 DIAGNOSIS — G5602 Carpal tunnel syndrome, left upper limb: Secondary | ICD-10-CM | POA: Diagnosis not present

## 2021-07-23 DIAGNOSIS — Z4789 Encounter for other orthopedic aftercare: Secondary | ICD-10-CM | POA: Insufficient documentation

## 2021-08-05 ENCOUNTER — Ambulatory Visit (INDEPENDENT_AMBULATORY_CARE_PROVIDER_SITE_OTHER): Payer: Medicare Other | Admitting: Family Medicine

## 2021-08-05 ENCOUNTER — Other Ambulatory Visit: Payer: Self-pay

## 2021-08-05 VITALS — BP 118/84 | HR 85 | Ht 65.5 in | Wt 192.2 lb

## 2021-08-05 DIAGNOSIS — M542 Cervicalgia: Secondary | ICD-10-CM | POA: Diagnosis not present

## 2021-08-05 DIAGNOSIS — Z79891 Long term (current) use of opiate analgesic: Secondary | ICD-10-CM

## 2021-08-05 DIAGNOSIS — M25532 Pain in left wrist: Secondary | ICD-10-CM | POA: Diagnosis not present

## 2021-08-05 MED ORDER — HYDROCHLOROTHIAZIDE 25 MG PO TABS: 25.0000 mg | ORAL_TABLET | Freq: Every day | ORAL | 1 refills | Status: DC

## 2021-08-05 MED ORDER — LISINOPRIL 2.5 MG PO TABS: ORAL_TABLET | ORAL | 1 refills | Status: DC

## 2021-08-05 MED ORDER — OXYCODONE HCL 5 MG PO TABS: ORAL_TABLET | ORAL | 0 refills | Status: DC

## 2021-08-05 MED ORDER — OXYCODONE HCL 5 MG PO TABS: 5.0000 mg | ORAL_TABLET | ORAL | 0 refills | Status: DC | PRN

## 2021-08-05 MED ORDER — ALPRAZOLAM 0.5 MG PO TABS: ORAL_TABLET | ORAL | 5 refills | Status: DC

## 2021-08-05 NOTE — Progress Notes (Signed)
   Subjective:    Patient ID: Amanda Osborne, female    DOB: Jun 11, 1951, 70 y.o.   MRN: [id number]  HPI  This patient was seen today for chronic pain  Patient would also like to discuss her Nortriptyline  The patient relates that she takes the nortriptyline only when she needs it she states she will take 1 or 2 when she gets a migraine and it tremendously helps but she does not take it every day nor does she want to take it every day  The medication list was reviewed and updated.  Location of Pain for which the patient has been treated with regarding narcotics: joint pain  Onset of this pain: years   -Compliance with medication: yes  - Number patient states they take daily: 6 a day  -when was the last dose patient took? today  The patient was advised the importance of maintaining medication and not using illegal substances with these.  Here for refills and follow up  The patient was educated that we can provide 3 monthly scripts for their medication, it is their responsibility to follow the instructions.  Side effects or complications from medications: none  Patient is aware that pain medications are meant to minimize the severity of the pain to allow their pain levels to improve to allow for better function. They are aware of that pain medications cannot totally remove their pain.  Due for UDT ( at least once per year) :    Review of Systems     Objective:   Physical Exam  General-in no acute distress Eyes-no discharge Lungs-respiratory rate normal, CTA CV-no murmurs,RRR Extremities skin warm dry no edema Neuro grossly normal Behavior normal, alert       Assessment & Plan:  Migraine headaches-see above Patient only uses nortriptyline when she has a headache she does not use it every day  1. Encounter for long-term opiate analgesic use The patient was seen in followup for chronic pain. A review over at their current pain status was discussed. Drug registry  was checked. Prescriptions were given.  Regular follow-up recommended. Discussion was held regarding the importance of compliance with medication as well as pain medication contract.  Patient was informed that medication may cause drowsiness and should not be combined  with other medications/alcohol or street drugs. If the patient feels medication is causing altered alertness then do not drive or operate dangerous equipment. Drug registry was checked 3 scripts sent in patient does not misuse the medicine she states it does not cause drowsiness  2. Neck pain She has chronic neck pain she has received injections in the past pain medicine allows her to function better  She also has chronic anxiety she has been on Xanax for years we have tapered her down to the slower amounts but unlikely that she will be able to take for further

## 2021-08-07 ENCOUNTER — Other Ambulatory Visit: Payer: Self-pay | Admitting: Family Medicine

## 2021-08-14 ENCOUNTER — Other Ambulatory Visit: Payer: Self-pay

## 2021-08-14 ENCOUNTER — Ambulatory Visit (INDEPENDENT_AMBULATORY_CARE_PROVIDER_SITE_OTHER): Payer: Medicare Other | Admitting: *Deleted

## 2021-08-14 DIAGNOSIS — Z0000 Encounter for general adult medical examination without abnormal findings: Secondary | ICD-10-CM

## 2021-08-14 NOTE — Patient Instructions (Signed)
Health Maintenance, Female Adopting a healthy lifestyle and getting preventive care are important in promoting health and wellness. Ask your health care provider about: The right schedule for you to have regular tests and exams. Things you can do on your own to prevent diseases and keep yourself healthy. What should I know about diet, weight, and exercise? Eat a healthy diet  Eat a diet that includes plenty of vegetables, fruits, low-fat dairy products, and lean protein. Do not eat a lot of foods that are high in solid fats, added sugars, or sodium. Maintain a healthy weight Body mass index (BMI) is used to identify weight problems. It estimates body fat based on height and weight. Your health care provider can help determine your BMI and help you achieve or maintain a healthy weight. Get regular exercise Get regular exercise. This is one of the most important things you can do for your health. Most adults should: Exercise for at least 150 minutes each week. The exercise should increase your heart rate and make you sweat (moderate-intensity exercise). Do strengthening exercises at least twice a week. This is in addition to the moderate-intensity exercise. Spend less time sitting. Even light physical activity can be beneficial. Watch cholesterol and blood lipids Have your blood tested for lipids and cholesterol at 71 years of age, then have this test every 5 years. Have your cholesterol levels checked more often if: Your lipid or cholesterol levels are high. You are older than 71 years of age. You are at high risk for heart disease. What should I know about cancer screening? Depending on your health history and family history, you may need to have cancer screening at various ages. This may include screening for: Breast cancer. Cervical cancer. Colorectal cancer. Skin cancer. Lung cancer. What should I know about heart disease, diabetes, and high blood pressure? Blood pressure and heart  disease High blood pressure causes heart disease and increases the risk of stroke. This is more likely to develop in people who have high blood pressure readings, are of African descent, or are overweight. Have your blood pressure checked: Every 3-5 years if you are 18-39 years of age. Every year if you are 40 years old or older. Diabetes Have regular diabetes screenings. This checks your fasting blood sugar level. Have the screening done: Once every three years after age 40 if you are at a normal weight and have a low risk for diabetes. More often and at a younger age if you are overweight or have a high risk for diabetes. What should I know about preventing infection? Hepatitis B If you have a higher risk for hepatitis B, you should be screened for this virus. Talk with your health care provider to find out if you are at risk for hepatitis B infection. Hepatitis C Testing is recommended for: Everyone born from 1945 through 1965. Anyone with known risk factors for hepatitis C. Sexually transmitted infections (STIs) Get screened for STIs, including gonorrhea and chlamydia, if: You are sexually active and are younger than 71 years of age. You are older than 71 years of age and your health care provider tells you that you are at risk for this type of infection. Your sexual activity has changed since you were last screened, and you are at increased risk for chlamydia or gonorrhea. Ask your health care provider if you are at risk. Ask your health care provider about whether you are at high risk for HIV. Your health care provider may recommend a prescription medicine   to help prevent HIV infection. If you choose to take medicine to prevent HIV, you should first get tested for HIV. You should then be tested every 3 months for as long as you are taking the medicine. Pregnancy If you are about to stop having your period (premenopausal) and you may become pregnant, seek counseling before you get  pregnant. Take 400 to 800 micrograms (mcg) of folic acid every day if you become pregnant. Ask for birth control (contraception) if you want to prevent pregnancy. Osteoporosis and menopause Osteoporosis is a disease in which the bones lose minerals and strength with aging. This can result in bone fractures. If you are 65 years old or older, or if you are at risk for osteoporosis and fractures, ask your health care provider if you should: Be screened for bone loss. Take a calcium or vitamin D supplement to lower your risk of fractures. Be given hormone replacement therapy (HRT) to treat symptoms of menopause. Follow these instructions at home: Lifestyle Do not use any products that contain nicotine or tobacco, such as cigarettes, e-cigarettes, and chewing tobacco. If you need help quitting, ask your health care provider. Do not use street drugs. Do not share needles. Ask your health care provider for help if you need support or information about quitting drugs. Alcohol use Do not drink alcohol if: Your health care provider tells you not to drink. You are pregnant, may be pregnant, or are planning to become pregnant. If you drink alcohol: Limit how much you use to 0-1 drink a day. Limit intake if you are breastfeeding. Be aware of how much alcohol is in your drink. In the [place] one drink equals one 12 oz bottle of beer (355 mL), one 5 oz glass of wine (148 mL), or one 1 oz glass of hard liquor (44 mL). General instructions Schedule regular health, dental, and eye exams. Stay current with your vaccines. Tell your health care provider if: You often feel depressed. You have ever been abused or do not feel safe at home. Summary Adopting a healthy lifestyle and getting preventive care are important in promoting health and wellness. Follow your health care provider's instructions about healthy diet, exercising, and getting tested or screened for diseases. Follow your health care provider's  instructions on monitoring your cholesterol and blood pressure. This information is not intended to replace advice given to you by your health care provider. Make sure you discuss any questions you have with your health care provider. Document Revised: 01/15/2021 Document Reviewed: 10/31/2018 [employer] Patient Education  2022 [employer]  

## 2021-08-14 NOTE — Progress Notes (Signed)
Subjective:   Amanda Osborne is a 70 y.o. female who presents for an Initial Medicare Annual Wellness Visit.  I connected with  Amanda Osborne on 08/14/21 by a telephone and verified that I am speaking with the correct person using two identifiers.   I discussed the limitations, risks, security and privacy concerns of performing an evaluation and management service by telephone and the availability of in person appointments. The patient expressed understanding and agreed to proceed  Location of patient : Home  Location of Provider: Telephone Persons partiipating in Virtual Visit [name] (patient), Athanasius Kesling (RMA)  Review of Systems    N/A Cardiac Risk Factors include: hypertension     Objective:    There were no vitals filed for this visit. There is no height or weight on file to calculate BMI.  Advanced Directives 08/14/2021 06/14/2018 06/11/2018 10/22/2015 08/28/2015 12/01/2014 07/30/2013  Does Patient Have a Medical Advance Directive? Yes Yes Yes No Yes Yes Patient has advance directive, copy not in chart  Type of Advance Directive [place];Living will Living will Living will - Living will Living will -  Does patient want to make changes to medical advance directive? - No - Patient declined No - Patient declined - - - -  Copy of Healthcare Power of Attorney in Chart? - - - - No - copy requested - Copy requested from family  Would patient like information on creating a medical advance directive? - - - No - patient declined information - - -    Current Medications (verified) Outpatient Encounter Medications as of 08/14/2021  Medication Sig   ALPRAZolam (XANAX) 0.5 MG tablet TAKE 1 TABLET BY MOUTH TWICE DAILY AS NEEDED FOR ANXIETY   esomeprazole (NEXIUM) 20 MG capsule Take 20 mg by mouth daily.    famotidine (PEPCID) 20 MG tablet TAKE 1 TABLET(20 MG) BY MOUTH TWICE DAILY (Patient taking differently: Take 20 mg by mouth 2 (two) times daily as needed  for heartburn or indigestion.)   fluticasone (FLONASE) 50 MCG/ACT nasal spray INSTILL 2 SPRAYS INTO EACH NOSTRIL ONCE DAILY   hydrochlorothiazide (HYDRODIURIL) 25 MG tablet Take 1 tablet (25 mg total) by mouth daily. TAKE 1 TABLET(25 MG) BY MOUTH DAILY   indomethacin (INDOCIN) 50 MG capsule Take 1 capsule (50 mg total) by mouth 2 (two) times daily with a meal.   KLOR-CON 20 MEQ packet Take 1 packet by mouth every other day.   levothyroxine (SYNTHROID) 25 MCG tablet TAKE 8 TABLETS BY MOUTH EVERY SATURDAY   lisinopril (ZESTRIL) 2.5 MG tablet TAKE 1 TABLET(2.5 MG) BY MOUTH DAILY   loratadine (CLARITIN) 10 MG tablet Take 10 mg by mouth daily.   nortriptyline (PAMELOR) 10 MG capsule Take 2 capsules (20 mg total) by mouth at bedtime. Please call [phone] to schedule appt to continue refills or may request from PCP.   oxyCODONE (OXY IR/ROXICODONE) 5 MG immediate release tablet Take 1 tablet po every 4 hrs prn severe pain   oxyCODONE (OXY IR/ROXICODONE) 5 MG immediate release tablet Take 1 tablet po every 4 hrs prn severe pain   oxyCODONE (OXY IR/ROXICODONE) 5 MG immediate release tablet Take 1 tablet (5 mg total) by mouth every 4 (four) hours as needed for severe pain.   polyethylene glycol powder (GLYCOLAX/MIRALAX) powder Take 17 g by mouth daily as needed (for constipation).   potassium chloride (KLOR-CON) 10 MEQ tablet TAKE 1 TABLET(10 MEQ) BY MOUTH DAILY   pravastatin (PRAVACHOL) 20 MG tablet TAKE 1  TABLET BY MOUTH DAILY   No facility-administered encounter medications on file as of 08/14/2021.    Allergies (verified) Topamax [topiramate], Celexa [citalopram hydrobromide], Fosamax [alendronate sodium], Neurontin [gabapentin], and Soma [carisoprodol]   History: Past Medical History:  Diagnosis Date   Anxiety    Arthritis    Cervical disc herniation    c5 and c6   Cervicogenic headache 05/13/2019   Chronic pain    Complication of anesthesia    Fibromyalgia    GERD (gastroesophageal reflux  disease)    Headache(784.0)    Hx: of migraines until age 78's   Hyperlipidemia    Hypertension    Iron deficiency anemia 11/30/2012   Left carpal tunnel syndrome 11/07/2018   Narcolepsy    Occipital neuralgia    PONV (postoperative nausea and vomiting)    DURING FIRST NECK SURGERY ; BUT REPORT IT MAY HAVE BEEN THE DILAUDID THAT CAUSED THE VOITING I   Unspecified vitamin D deficiency 03/26/2013   Wrist fracture, bilateral 2005 and 2010   right then left   Past Surgical History:  Procedure Laterality Date   ANTERIOR CERVICAL DECOMP/DISCECTOMY FUSION  2000   C6-C7 DR [facility]    ANTERIOR CERVICAL DECOMP/DISCECTOMY FUSION N/A 08/05/2013   Procedure: Cervical five-six Anterior cervical decompression/diskectomy/fusion/ Synthes plate removal;  Surgeon: Kristeen Miss, MD;  Location: [facility];  Service: Neurosurgery;  Laterality: N/A;   BONE GRAFT HIP ILIAC CREST  2006   to right wrist to correct fracture, DR.  ORTHO    COLONOSCOPY     Hx; of   DILATION AND CURETTAGE OF UTERUS     ESOPHAGEAL DILATION N/A 10/22/2015   Procedure: ESOPHAGEAL DILATION;  Surgeon: Rogene Houston, MD;  Location: [facility];  Service: Endoscopy;  Laterality: N/A;   ESOPHAGOGASTRODUODENOSCOPY N/A 08/28/2015   Procedure: ESOPHAGOGASTRODUODENOSCOPY (EGD);  Surgeon: Rogene Houston, MD;  Location: [facility];  Service: Endoscopy;  Laterality: N/A;  910am   ESOPHAGOGASTRODUODENOSCOPY N/A 10/22/2015   Procedure: ESOPHAGOGASTRODUODENOSCOPY (EGD);  Surgeon: Rogene Houston, MD;  Location: [facility];  Service: Endoscopy;  Laterality: N/A;  12:00   FRACTURE SURGERY     Hx: of right wrist surgery   GASTRIC BYPASS  1981   INCISIONAL HERNIA REPAIR N/A 06/14/2018   Procedure: LAPAROSCOPIC INCISIONAL HERNIA REPAIR WITH INSERTION OF MESH ERAS PATHWAY;  Surgeon: Kinsinger, Arta Bruce, MD;  Location: [facility];  Service: General;  Laterality: N/A;   TONSILLECTOMY     Family History  Problem Relation Age of Onset    Stroke Mother    Heart disease Mother    Diabetes Mother    Heart disease Father    Cancer - Colon Father    Other Brother    Social History   Socioeconomic History   Marital status: Divorced    Spouse name: Not on file   Number of children: 1   Years of education: HS   Highest education level: Not on file  Occupational History   Occupation: retired  Tobacco Use   Smoking status: Never   Smokeless tobacco: Never  Vaping Use   Vaping Use: Never used  Substance and Sexual Activity   Alcohol use: No   Drug use: No   Sexual activity: Not Currently    Birth control/protection: Post-menopausal  Other Topics Concern   Not on file  Social History Narrative   Patient lives at home alone.   Caffeine Use: daily (BC podwder)   Social Determinants of Health   Financial  Resource Strain: Medium Risk   Difficulty of Paying Living Expenses: Somewhat hard  Food Insecurity: No Food Insecurity   Worried About Charity fundraiser in the Last Year: Never true   Ran Out of Food in the Last Year: Never true  Transportation Needs: No Transportation Needs   Lack of Transportation (Medical): No   Lack of Transportation (Non-Medical): No  Physical Activity: Sufficiently Active   Days of Exercise per Week: 3 days   Minutes of Exercise per Session: 130 min  Stress: Stress Concern Present   Feeling of Stress : To some extent  Social Connections: Socially Isolated   Frequency of Communication with Friends and Family: Once a week   Frequency of Social Gatherings with Friends and Family: Once a week   Attends Religious Services: Never   Marine scientist or Organizations: No   Attends Music therapist: Never   Marital Status: Divorced    Tobacco Counseling N/A   Clinical Intake:  Pre-visit preparation completed: Yes  Pain : No/denies pain     Diabetes: No  How often do you need to have someone help you when you read instructions, pamphlets, or other written  materials from your doctor or pharmacy?: 1 - Never  Diabetic? N/A  Interpreter Needed?: No      Activities of Daily Living In your present state of health, do you have any difficulty performing the following activities: 08/14/2021  Hearing? Y  Vision? N  Difficulty concentrating or making decisions? N  Walking or climbing stairs? Y  Dressing or bathing? N  Doing errands, shopping? N  Preparing Food and eating ? N  Using the Toilet? N  In the past six months, have you accidently leaked urine? N  Do you have problems with loss of bowel control? N  Managing your Medications? N  Managing your Finances? N  Housekeeping or managing your Housekeeping? N  Some recent data might be hidden    Patient Care Team: Kathyrn Drown, MD as PCP - General (Family Medicine)  Indicate any recent Medical Services you may have received from other than [facility] providers in the past year (date may be approximate).     Assessment:   This is a routine wellness examination for [place].  Hearing/Vision screen No results found.  Dietary issues and exercise activities discussed: Current Exercise Habits: Home exercise routine, Time (Minutes): 30, Frequency (Times/Week): 3, Weekly Exercise (Minutes/Week): 90, Exercise limited by: None identified   Goals Addressed   None    Depression Screen PHQ 2/9 Scores 08/14/2021 08/14/2021 08/05/2021 03/11/2021 02/01/2021 01/15/2021 12/17/2020  PHQ - 2 Score 2 0 0 0 0 1 1  PHQ- 9 Score 4 - - - - - 3    Fall Risk Fall Risk  08/14/2021 08/05/2021 05/05/2021 03/11/2021 02/01/2021  Falls in the past year? 0 0 0 0 0  Number falls in past yr: 0 - 0 - -  Injury with Fall? 0 - 0 - -  Risk for fall due to : Impaired vision;History of fall(s) No Fall Risks No Fall Risks No Fall Risks -  Follow up Falls evaluation completed Falls evaluation completed Falls evaluation completed Falls evaluation completed Falls evaluation completed    [place]:  Any stairs in or around the home? Yes  If so, are there any without handrails? Yes  Home free of loose throw rugs in walkways, pet beds, electrical cords, etc? No  Adequate lighting in your  home to reduce risk of falls? Yes   ASSISTIVE DEVICES UTILIZED TO PREVENT FALLS:  Life alert? Yes  Use of a cane, walker or w/c? No  Grab bars in the bathroom? No  Shower chair or bench in shower? No  Elevated toilet seat or a handicapped toilet? No   TIMED UP AND GO:  Was the test performed? No .  Length of time to ambulate 10 feet: n/a sec.     Cognitive Function:     6CIT Screen 08/14/2021  What Year? 0 points  What month? 0 points  What time? 0 points  Count back from 20 0 points  Months in reverse 0 points  Repeat phrase 0 points  Total Score 0    Immunizations Immunization History  Administered Date(s) Administered   Moderna Sars-Covid-2 Vaccination 02/14/2020, 03/18/2020, 12/14/2020   Pneumococcal Conjugate-13 12/30/2016    TDAP status: Due, Education has been provided regarding the importance of this vaccine. Advised may receive this vaccine at local pharmacy or Health Dept. Aware to provide a copy of the vaccination record if obtained from local pharmacy or Health Dept. Verbalized acceptance and understanding.  Flu Vaccine status: Declined, Education has been provided regarding the importance of this vaccine but patient still declined. Advised may receive this vaccine at local pharmacy or Health Dept. Aware to provide a copy of the vaccination record if obtained from local pharmacy or Health Dept. Verbalized acceptance and understanding.  Pneumococcal vaccine status: Up to date  Covid-19 vaccine status: Completed vaccines  Qualifies for Shingles Vaccine? No   Zostavax completed No   Shingrix Completed?: No.    Education has been provided regarding the importance of this vaccine. Patient has been advised to call insurance company to determine out of pocket expense  if they have not yet received this vaccine. Advised may also receive vaccine at local pharmacy or Health Dept. Verbalized acceptance and understanding.  Screening Tests Health Maintenance  Topic Date Due   TETANUS/TDAP  Never done   Zoster Vaccines- Shingrix (1 of 2) Never done   COLONOSCOPY (Pts 45-71yrs Insurance coverage will need to be confirmed)  08/22/2021 (Originally 12/22/2012)   MAMMOGRAM  02/12/2023   DEXA SCAN  Completed   Hepatitis C Screening  Completed   HPV VACCINES  Aged Out   INFLUENZA VACCINE  Discontinued   COVID-19 Vaccine  Discontinued    Health Maintenance  Health Maintenance Due  Topic Date Due   TETANUS/TDAP  Never done   Zoster Vaccines- Shingrix (1 of 2) Never done    Colposcopy never had  Mammogram status: Completed 02/11/2021. Repeat every year  Bone Density status: Completed 02/19/2021. Results reflect: Bone density results: OSTEOPOROSIS. Repeat every 2 years.  Lung Cancer Screening: (Low Dose CT Chest recommended if Age 6-80 years, 30 pack-year currently smoking OR have quit w/in 15years.) does not qualify.   Lung Cancer Screening Referral: n/a  Additional Screening:  Hepatitis C Screening: 09/10/2015 Completed   Vision Screening: Recommended annual ophthalmology exams for early detection of glaucoma and other disorders of the eye. Is the patient up to date with their annual eye exam?  No  Who is the provider or what is the name of the office in which the patient attends annual eye exams? Eye doctor in [place] If pt is not established with a provider, would they like to be referred to a provider to establish care? No .   Dental Screening: Recommended annual dental exams for proper oral hygiene  Community Resource Referral /  Chronic Care Management: CRR required this visit?  No   CCM required this visit?  No      Plan:     I have personally reviewed and noted the following in the patient's chart:   Medical and social history Use of  alcohol, tobacco or illicit drugs  Current medications and supplements including opioid prescriptions. Patient is not currently taking opioid prescriptions. Functional ability and status Nutritional status Physical activity Advanced directives List of other physicians Hospitalizations, surgeries, and ER visits in previous 12 months Vitals Screenings to include cognitive, depression, and falls Referrals and appointments  In addition, I have reviewed and discussed with patient certain preventive protocols, quality metrics, and best practice recommendations. A written personalized care plan for preventive services as well as general preventive health recommendations were provided to patient.     [place], [place]   08/14/2021   Nurse Notes: non face to face 1 hr visit

## 2021-09-22 ENCOUNTER — Telehealth: Payer: Self-pay | Admitting: *Deleted

## 2021-09-22 NOTE — Chronic Care Management (AMB) (Signed)
  Chronic Care Management   Note  09/22/2021 Name: Amanda Osborne MRN: [id number] DOB: Apr 05, 1951  Amanda Osborne is a 70 y.o. year old female who is a primary care patient of Luking, Elayne Snare, MD. I reached out to [name] by phone today in response to a referral sent by [name]'s PCP.  Amanda Osborne was given information about Chronic Care Management services today including:  CCM service includes personalized support from designated clinical staff supervised by her physician, including individualized plan of care and coordination with other care providers 24/7 contact phone numbers for assistance for urgent and routine care needs. Service will only be billed when office clinical staff spend 20 minutes or more in a month to coordinate care. Only one practitioner may furnish and bill the service in a calendar month. The patient may stop CCM services at any time (effective at the end of the month) by phone call to the office staff. The patient is responsible for co-pay (up to 20% after annual deductible is met) if co-pay is required by the individual health plan.   Patient agreed to services and verbal consent obtained.   Follow up plan: Telephone appointment with care management team member scheduled for:09/27/21  [facility]  Direct Dial: [phone]

## 2021-09-27 ENCOUNTER — Ambulatory Visit (INDEPENDENT_AMBULATORY_CARE_PROVIDER_SITE_OTHER): Payer: Medicare Other | Admitting: Pharmacist

## 2021-09-27 DIAGNOSIS — E7849 Other hyperlipidemia: Secondary | ICD-10-CM

## 2021-09-27 DIAGNOSIS — I10 Essential (primary) hypertension: Secondary | ICD-10-CM

## 2021-09-27 DIAGNOSIS — M816 Localized osteoporosis [Lequesne]: Secondary | ICD-10-CM

## 2021-09-27 NOTE — Patient Instructions (Signed)
Amanda Osborne,  It was great to talk to you today!  Please call me with any questions or concerns.   Visit Information   PATIENT GOALS/PLAN OF CARE:  Care Plan : Medication Management  Updates made by Beryle Lathe, [place] since 09/27/2021 12:00 AM     Problem: HTN, Osteoporosis, HLD   Priority: High  Onset Date: 09/27/2021     Long-Range Goal: Disease Progression Prevention   Start Date: 09/27/2021  Expected End Date: 12/26/2021  This Visit's Progress: On track  Priority: High  Note:   Current Barriers:  Suboptimal therapeutic regimen for osteoporosis  Pharmacist Clinical Goal(s):  Patient will Adhere to plan to optimize therapeutic regimen for osteoporosis as evidenced by report of adherence to recommended medication management changes through collaboration with PharmD and provider.   Interventions: 1:1 collaboration with Kathyrn Drown, MD regarding development and update of comprehensive plan of care as evidenced by provider attestation and co-signature Inter-disciplinary care team collaboration (see longitudinal plan of care) Comprehensive medication review performed; medication list updated in electronic medical record  Hypertension - New goal.: Blood pressure under good control. Blood pressure is at goal of <130/80 mmHg per 2017 AHA/ACC guidelines. Current medications: lisinopril 2.5 mg by mouth every morning and hydrochlorothiazide 25 mg by mouth every morning Intolerances: none Taking medications as directed: yes Side effects thought to be attributed to current medication regimen: no Current exercise: not discussed today Current home blood pressure: patient does not check often Continue lisinopril 2.5 mg by mouth every morning and hydrochlorothiazide 25 mg by mouth every morning Encourage dietary sodium restriction/DASH diet Recommend regular aerobic exercise  Hyperlipidemia - New goal.: Controlled. LDL at goal of <100 due to moderate risk given <2 major  CV risk factors (hypertension) and 10-year risk <10% per 2020 AACE/ACE guidelines. Triglycerides at goal of <150 per 2020 AACE/ACE guidelines. Current medications: pravastatin 20 mg by mouth once daily Intolerances: none Taking medications as directed: yes Side effects thought to be attributed to current medication regimen: no Continue pravastatin 20 mg by mouth once daily Encourage dietary reduction of high fat containing foods such as butter, nuts, bacon, egg yolks, etc. Recommend regular aerobic exercise  Osteoporosis - New goal.: Suboptimally managed Current medications:  vitamin D3 4000 IU twice daily to maintain adequate vitamin D levels Intolerances:  esophagitis with alendronate Taking medications as directed: yes Side effects thought to be attributed to current medication regimen: no Last DEXA (02/20/20): T -3.7 right femur, T -3.6 femur total mean, and T -2.0 spine Falls reported in last year: 0 Recommend 1,000 mg of elemental calcium per day from diet or supplementation. Supplemental calcium not necessary if appropriate amount obtained through diet. Calcium citrate supplementation preferred due to chronic gastric acid-suppressive therapy Patient reports trouble swallowing calcium in the past. Recommend trying calcium minis or calcium petites.  Recommend 800 - 2,000 IU of vitamin D supplementation per day to maintain adequate vitamin D levels Recommend walking, low impact aerobic exercise, or strength training Discussed Reclast and Prolia with patient as well as referral to endocrinology to further discuss pharmacologic options; however, patient declines at this time  Patient Goals/Self-Care Activities Patient will:  Take medications as prescribed Start taking calcium mini softgels or calcium petites  Follow Up Plan: Telephone follow up appointment with care management team member scheduled for: 03/28/22      Consent to CCM Services: Ms. Talford was given information about  Chronic Care Management services including:  CCM service includes personalized support from  designated clinical staff supervised by her physician, including individualized plan of care and coordination with other care providers 24/7 contact phone numbers for assistance for urgent and routine care needs. Service will only be billed when office clinical staff spend 20 minutes or more in a month to coordinate care. Only one practitioner may furnish and bill the service in a calendar month. The patient may stop CCM services at any time (effective at the end of the month) by phone call to the office staff. The patient will be responsible for cost sharing (co-pay) of up to 20% of the service fee (after annual deductible is met).  Patient agreed to services and verbal consent obtained.   Patient verbalizes understanding of instructions provided today and agrees to view in [place].   Telephone follow up appointment with care management team member scheduled for:03/28/22  Kennon Holter, PharmD Clinical Pharmacist [place] [phone]

## 2021-09-27 NOTE — Chronic Care Management (AMB) (Signed)
Chronic Care Management Pharmacy Note  09/27/2021 Name:  Amanda Osborne MRN:  [id number] DOB:  May 11, 1951  Summary:  Osteoporosis  Recommend 1,000 mg of elemental calcium per day from diet or supplementation. Supplemental calcium not necessary if appropriate amount obtained through diet. Calcium citrate supplementation preferred due to chronic gastric acid-suppressive therapy Patient reports trouble swallowing calcium in the past. Recommend trying calcium minis or calcium petites.  Discussed Reclast and Prolia with patient as well as referral to endocrinology to further discuss pharmacologic options; however, patient declines at this time  Subjective: Amanda Osborne is an 70 y.o. year old female who is a primary patient of Luking, Elayne Snare, MD.  The CCM team was consulted for assistance with disease management and care coordination needs.    Engaged with patient by telephone for initial visit in response to provider referral for pharmacy case management and/or care coordination services.   Consent to Services:  The patient was given the following information about Chronic Care Management services today, agreed to services, and gave verbal consent: 1. CCM service includes personalized support from designated clinical staff supervised by the primary care provider, including individualized plan of care and coordination with other care providers 2. 24/7 contact phone numbers for assistance for urgent and routine care needs. 3. Service will only be billed when office clinical staff spend 20 minutes or more in a month to coordinate care. 4. Only one practitioner may furnish and bill the service in a calendar month. 5.The patient may stop CCM services at any time (effective at the end of the month) by phone call to the office staff. 6. The patient will be responsible for cost sharing (co-pay) of up to 20% of the service fee (after annual deductible is met). Patient agreed to services and consent  obtained.  Patient Care Team: Kathyrn Drown, MD as PCP - General (Family Medicine) Beryle Lathe, [facility] (Pharmacist)  Objective:  Lab Results  Component Value Date   CREATININE 0.48 (L) 05/04/2021   CREATININE 0.64 10/19/2020   CREATININE 0.48 10/18/2020    Lab Results  Component Value Date   HGBA1C 4.4 05/01/2015   Last diabetic Eye exam: No results found for: HMDIABEYEEXA  Last diabetic Foot exam: No results found for: HMDIABFOOTEX      Component Value Date/Time   CHOL 180 05/04/2021 1132   TRIG 115 05/04/2021 1132   HDL 62 05/04/2021 1132   CHOLHDL 2.9 05/04/2021 1132   CHOLHDL 4.5 12/24/2013 1032   VLDL 24 12/24/2013 1032   LDLCALC 98 05/04/2021 1132    Hepatic Function Latest Ref Rng & Units 10/19/2020 03/16/2020 06/26/2017  Total Protein 6.5 - 8.1 g/dL 6.5 6.7 5.8(L)  Albumin 3.5 - 5.0 g/dL 3.5 4.4 3.9  AST 15 - 41 U/L _0 ALT 0 - 44 U/L _1 Alk Phosphatase 38 - 126 U/L 50 83 58  Total Bilirubin 0.3 - 1.2 mg/dL 0.8 <0.2 <0.2  Bilirubin, Direct 0.00 - 0.40 mg/dL - 0.05 0.04    Lab Results  Component Value Date/Time   TSH 2.290 05/04/2021 11:32 AM   TSH 5.250 (H) 07/01/2020 10:31 AM   FREET4 0.96 07/01/2020 10:31 AM   FREET4 1.32 09/16/2019 11:06 AM    CBC Latest Ref Rng & Units 05/04/2021 10/19/2020 10/18/2020  WBC 3.4 - 10.8 x10E3/uL 4.6 6.2 7.6  Hemoglobin 11.1 - 15.9 g/dL 11.8 13.9 13.3  Hematocrit 34.0 - 46.6 % 37.3 44.3 42.2  Platelets  150 - 450 x10E3/uL 290 309 312    Lab Results  Component Value Date/Time   VD25OH 44 12/24/2013 10:53 AM   VD25OH 54 09/20/2013 10:10 AM    Clinical ASCVD: No  The 10-year ASCVD risk score (Arnett DK, et al., 2019) is: 10.2%   Values used to calculate the score:     Age: 70 years     Sex: Female     Is Non-Hispanic African American: No     Diabetic: No     Tobacco smoker: No     Systolic Blood Pressure: 324 mmHg     Is BP treated: Yes     HDL Cholesterol: 62 mg/dL     Total  Cholesterol: 180 mg/dL     Social History   Tobacco Use  Smoking Status Never  Smokeless Tobacco Never   BP Readings from Last 3 Encounters:  08/05/21 118/84  05/05/21 118/60  03/18/21 116/74   Pulse Readings from Last 3 Encounters:  08/05/21 85  05/05/21 80  03/18/21 73   Wt Readings from Last 3 Encounters:  08/05/21 192 lb 3.2 oz (87.2 kg)  05/05/21 192 lb (87.1 kg)  03/18/21 185 lb (83.9 kg)    Assessment: Review of patient past medical history, allergies, medications, health status, including review of consultants reports, laboratory and other test data, was performed as part of comprehensive evaluation and provision of chronic care management services.   SDOH:  (Social Determinants of Health) assessments and interventions performed:    CCM Care Plan  Allergies  Allergen Reactions   Topamax [Topiramate] Anaphylaxis    Memory issues   Celexa [Citalopram Hydrobromide] Nausea And Vomiting   Fosamax [Alendronate Sodium]     Esophagitis    Neurontin [Gabapentin]     Drowsy    Soma [Carisoprodol]     dizzy    Medications Reviewed Today     Reviewed by Beryle Lathe, [place] (Pharmacist) on 09/27/21 at 70  Med List Status: <None>   Medication Order Taking? Sig Documenting Provider Last Dose Status Informant  acetaminophen (TYLENOL) 500 MG tablet [id number] Yes Take 500 mg by mouth every 6 (six) hours as needed. [provider] Taking Active Self  ALPRAZolam Duanne Moron) 0.5 MG tablet [id number] Yes TAKE 1 TABLET BY MOUTH TWICE DAILY AS NEEDED FOR ANXIETY Luking, Scott A, MD Taking Active   esomeprazole (NEXIUM) 20 MG capsule [id number] Yes Take 20 mg by mouth daily.  [provider] Taking Active Self  famotidine (PEPCID) 20 MG tablet [id number] Yes TAKE 1 TABLET(20 MG) BY MOUTH TWICE DAILY  Patient taking differently: Take 20 mg by mouth 2 (two) times daily as needed for heartburn or indigestion.   Kathyrn Drown, MD Taking Active            Med  Note Dom[place]a [place] Nov 02, 2020 11:22 AM)    fluticasone [facility]) 50 MCG/ACT nasal spray [id number] Yes INSTILL 2 SPRAYS INTO EACH NOSTRIL ONCE DAILY Kathyrn Drown, MD Taking Active            Med Note Rhea Belton Sep 27, 2021  3:19 PM) Only uses as needed seasonally  hydrochlorothiazide (HYDRODIURIL) 25 MG tablet [id number] Yes Take 1 tablet (25 mg total) by mouth daily. TAKE 1 TABLET(25 MG) BY MOUTH DAILY Luking, Elayne Snare, MD Taking Active   ibuprofen (ADVIL) 200 MG tablet [id number] Yes Take 200 mg by mouth every 6 (six) hours as needed. [provider] Taking Active   Iron-Vitamin C (VITRON-C PO) [id number] Yes Take 65 mg by mouth 2 (two) times daily. [provider] Taking Active Self  levothyroxine (SYNTHROID) 25 MCG tablet [id number] Yes TAKE 8 TABLETS BY MOUTH EVERY SATURDAY Luking, Scott A, MD Taking Active   lisinopril (ZESTRIL) 2.5 MG tablet [id number] Yes TAKE 1 TABLET(2.5 MG) BY MOUTH DAILY Luking, Scott A, MD Taking Active   loratadine (CLARITIN) 10 MG tablet [id number] Yes Take 10 mg by mouth daily. [provider] Taking Active Self           Med Note Rhea Belton Sep 27, 2021  3:29 PM) Seasonally  nortriptyline (PAMELOR) 10 MG capsule [id number] Yes Take 2 capsules (20 mg total) by mouth at bedtime. Please call [phone] to schedule appt to continue refills or may request from PCP. Kathrynn Ducking, MD Taking Active            Med Note Waldo Laine, Hardie Pulley Sep 27, 2021  3:31 PM) Only uses as needed when a migraine attack occurs  oxyCODONE (OXY IR/ROXICODONE) 5 MG immediate release tablet [id number] Yes Take 1 tablet po every 4 hrs prn severe pain Kathyrn Drown, MD Taking Active   oxyCODONE (OXY IR/ROXICODONE) 5 MG immediate release tablet [id number]  Take 1 tablet po every 4 hrs prn severe pain Kathyrn Drown, MD  Active   oxyCODONE (OXY IR/ROXICODONE) 5 MG immediate release tablet [id number]  Take 1 tablet (5  mg total) by mouth every 4 (four) hours as needed for severe pain. Kathyrn Drown, MD  Active   polyethylene glycol powder [facility]) powder [id number] Yes Take 17 g by mouth daily as needed (for constipation). [provider] Taking Active Self           Med Note Jimmey Ralph, Frederik Schmidt Oct 18, 2020  9:52 PM)    potassium chloride (KLOR-CON) 10 MEQ tablet [id number] Yes TAKE 1 TABLET(10 MEQ) BY MOUTH DAILY Kathyrn Drown, MD Taking Active   pravastatin (PRAVACHOL) 20 MG tablet [id number] Yes TAKE 1 TABLET BY MOUTH DAILY Kathyrn Drown, MD Taking Active             Patient Active Problem List   Diagnosis Date Noted   Left hand pain 03/11/2021   Precordial chest pain 10/18/2020   Dysuria 07/21/2020   Cystitis 07/21/2020   Cervicogenic headache 05/13/2019   Left carpal tunnel syndrome 11/07/2018   Incisional hernia 06/14/2018   Hypothyroid 02/27/2018   IDA (iron deficiency anemia) 08/10/2017   GERD (gastroesophageal reflux disease) 05/01/2015   Osteoporosis 12/22/2014   Occipital neuralgia 09/16/2014   Spasmodic torticollis 09/16/2014   HTN (hypertension), benign 03/17/2014   Thyroid nodule 03/17/2014   Right ankle pain 12/20/2013   Spondylosis, cervical, with myelopathy C5-6 08/06/2013   Kyphosis 05/02/2013   Hyperlipidemia 05/02/2013   Chronic back pain 03/29/2013   Generalized anxiety disorder 03/29/2013   Depression with anxiety 03/29/2013   H/O gastric bypass 03/29/2013   Unspecified vitamin D deficiency 03/26/2013   Iron deficiency anemia 11/30/2012    Immunization History  Administered Date(s) Administered   Moderna Sars-Covid-2 Vaccination 02/14/2020, 03/18/2020, 12/14/2020   Pneumococcal Conjugate-13 12/30/2016    Conditions to be addressed/monitored: HTN, HLD, and Osteoporosis  Care Plan : Medication Management  Updates made by Beryle Lathe, [place] since 09/27/2021 12:00 AM     Problem: HTN, Osteoporosis, HLD   Priority: High   Onset Date: 09/27/2021  Long-Range Goal: Disease Progression Prevention   Start Date: 09/27/2021  Expected End Date: 12/26/2021  This Visit's Progress: On track  Priority: High  Note:   Current Barriers:  Suboptimal therapeutic regimen for osteoporosis  Pharmacist Clinical Goal(s):  Patient will Adhere to plan to optimize therapeutic regimen for osteoporosis as evidenced by report of adherence to recommended medication management changes through collaboration with PharmD and provider.   Interventions: 1:1 collaboration with Kathyrn Drown, MD regarding development and update of comprehensive plan of care as evidenced by provider attestation and co-signature Inter-disciplinary care team collaboration (see longitudinal plan of care) Comprehensive medication review performed; medication list updated in electronic medical record  Hypertension - New goal.: Blood pressure under good control. Blood pressure is at goal of <130/80 mmHg per 2017 AHA/ACC guidelines. Current medications: lisinopril 2.5 mg by mouth every morning and hydrochlorothiazide 25 mg by mouth every morning Intolerances: none Taking medications as directed: yes Side effects thought to be attributed to current medication regimen: no Current exercise: not discussed today Current home blood pressure: patient does not check often Continue lisinopril 2.5 mg by mouth every morning and hydrochlorothiazide 25 mg by mouth every morning Encourage dietary sodium restriction/DASH diet Recommend regular aerobic exercise  Hyperlipidemia - New goal.: Controlled. LDL at goal of <100 due to moderate risk given <2 major CV risk factors (hypertension) and 10-year risk <10% per 2020 AACE/ACE guidelines. Triglycerides at goal of <150 per 2020 AACE/ACE guidelines. Current medications: pravastatin 20 mg by mouth once daily Intolerances: none Taking medications as directed: yes Side effects thought to be attributed to current medication  regimen: no Continue pravastatin 20 mg by mouth once daily Encourage dietary reduction of high fat containing foods such as butter, nuts, bacon, egg yolks, etc. Recommend regular aerobic exercise  Osteoporosis - New goal.: Suboptimally managed Current medications:  vitamin D3 4000 IU twice daily to maintain adequate vitamin D levels Intolerances:  esophagitis with alendronate Taking medications as directed: yes Side effects thought to be attributed to current medication regimen: no Last DEXA (02/20/20): T -3.7 right femur, T -3.6 femur total mean, and T -2.0 spine Falls reported in last year: 0 Recommend 1,000 mg of elemental calcium per day from diet or supplementation. Supplemental calcium not necessary if appropriate amount obtained through diet. Calcium citrate supplementation preferred due to chronic gastric acid-suppressive therapy Patient reports trouble swallowing calcium in the past. Recommend trying calcium minis or calcium petites.  Recommend 800 - 2,000 IU of vitamin D supplementation per day to maintain adequate vitamin D levels Recommend walking, low impact aerobic exercise, or strength training Discussed Reclast and Prolia with patient as well as referral to endocrinology to further discuss pharmacologic options; however, patient declines at this time  Patient Goals/Self-Care Activities Patient will:  Take medications as prescribed Start taking calcium mini softgels or calcium petites  Follow Up Plan: Telephone follow up appointment with care management team member scheduled for: 03/28/22      Medication Assistance: None required.  Patient affirms current coverage meets needs.  Patient's preferred pharmacy is:  [facility] [phone] - [place], [place] AT [place] [address] [place] [address] Phone: [phone] Fax: [phone]  Follow Up:  Patient agrees to Care Plan and Follow-up.  Plan: Telephone follow up  appointment with care management team member scheduled for:  03/28/22  Kennon Holter, PharmD Clinical Pharmacist [place] [phone]

## 2021-10-20 DIAGNOSIS — M816 Localized osteoporosis [Lequesne]: Secondary | ICD-10-CM

## 2021-10-20 DIAGNOSIS — I10 Essential (primary) hypertension: Secondary | ICD-10-CM

## 2021-10-20 DIAGNOSIS — E7849 Other hyperlipidemia: Secondary | ICD-10-CM

## 2021-11-04 ENCOUNTER — Other Ambulatory Visit: Payer: Self-pay

## 2021-11-04 ENCOUNTER — Encounter: Payer: Self-pay | Admitting: Family Medicine

## 2021-11-04 ENCOUNTER — Ambulatory Visit (INDEPENDENT_AMBULATORY_CARE_PROVIDER_SITE_OTHER): Payer: Medicare Other | Admitting: Family Medicine

## 2021-11-04 VITALS — BP 114/68 | HR 72 | Temp 98.1°F | Ht 65.5 in | Wt 196.0 lb

## 2021-11-04 DIAGNOSIS — I10 Essential (primary) hypertension: Secondary | ICD-10-CM

## 2021-11-04 DIAGNOSIS — M545 Low back pain, unspecified: Secondary | ICD-10-CM

## 2021-11-04 DIAGNOSIS — M4712 Other spondylosis with myelopathy, cervical region: Secondary | ICD-10-CM | POA: Diagnosis not present

## 2021-11-04 DIAGNOSIS — E038 Other specified hypothyroidism: Secondary | ICD-10-CM

## 2021-11-04 DIAGNOSIS — M25551 Pain in right hip: Secondary | ICD-10-CM

## 2021-11-04 DIAGNOSIS — G8929 Other chronic pain: Secondary | ICD-10-CM

## 2021-11-04 DIAGNOSIS — Z79891 Long term (current) use of opiate analgesic: Secondary | ICD-10-CM

## 2021-11-04 DIAGNOSIS — F411 Generalized anxiety disorder: Secondary | ICD-10-CM

## 2021-11-04 DIAGNOSIS — Z79899 Other long term (current) drug therapy: Secondary | ICD-10-CM

## 2021-11-04 DIAGNOSIS — D509 Iron deficiency anemia, unspecified: Secondary | ICD-10-CM

## 2021-11-04 DIAGNOSIS — E7849 Other hyperlipidemia: Secondary | ICD-10-CM

## 2021-11-04 MED ORDER — LEVOTHYROXINE SODIUM 25 MCG PO TABS: ORAL_TABLET | ORAL | 1 refills | Status: DC

## 2021-11-04 MED ORDER — OXYCODONE HCL 5 MG PO TABS
ORAL_TABLET | ORAL | 0 refills | Status: DC
Start: 2021-11-04 — End: 2022-02-02

## 2021-11-04 MED ORDER — OXYCODONE HCL 5 MG PO TABS
5.0000 mg | ORAL_TABLET | ORAL | 0 refills | Status: DC | PRN
Start: 2021-11-04 — End: 2022-02-02

## 2021-11-04 MED ORDER — OXYCODONE HCL 5 MG PO TABS: ORAL_TABLET | ORAL | 0 refills | Status: DC

## 2021-11-04 MED ORDER — ALPRAZOLAM 0.5 MG PO TABS: ORAL_TABLET | ORAL | 5 refills | Status: DC

## 2021-11-04 NOTE — Progress Notes (Signed)
Subjective:    Patient ID: Amanda Osborne, female    DOB: [birth year], 70 y.o.   MRN: [id number]  HPI  This patient was seen today for chronic pain  The medication list was reviewed and updated. Patient has a long history of chronic neck pain with impingement of nerves in the shoulders.  Because of this she has been on pain medication for years. Location of Pain for which the patient has been treated with regarding narcotics: severe joint pain  Onset of this pain:   -Compliance with medication: Yes  - Number patient states they take daily: 1 tablet every 4 hours as needed for severe pain  -when was the last dose patient took? 9 am this morning  The patient was advised the importance of maintaining medication and not using illegal substances with these.  Here for refills and follow up  The patient was educated that we can provide 3 monthly scripts for their medication, it is their responsibility to follow the instructions.  Side effects or complications from medications: None  Patient is aware that pain medications are meant to minimize the severity of the pain to allow their pain levels to improve to allow for better function. They are aware of that pain medications cannot totally remove their pain.  Due for UDT ( at least once per year) : Yes  Scale of 1 to 10 ( 1 is least 10 is most) Your pain level without the medicine: 9 Your pain level with medication: 3 She relates she is having intermittent right hip pain discomfort worse with certain movements.  She had some leftover Indocin that cardiologist had her on and that seems to help her she only uses 1/day when it flares up she states she tolerates this better than the ibuprofen she was cautioned never to take both medicines on the same day Pain of right hip joint  Spondylosis, cervical, with myelopathy C5-6  Generalized anxiety disorder  Chronic bilateral low back pain, unspecified whether sciatica present  Iron  deficiency anemia, unspecified iron deficiency anemia type - Plan: Ferritin, Iron Binding Cap (TIBC)([facility]/Sun[facility])  Other specified hypothyroidism - Plan: TSH, T4, free  HTN (hypertension), benign - Plan: Basic metabolic panel  Encounter for long-term opiate analgesic use - Plan: ToxASSURE Select 13 (MW), Urine  Other hyperlipidemia - Plan: Lipid panel  High risk medication use - Plan: Basic metabolic panel       Review of Systems     Objective:   Physical Exam General-in no acute distress Eyes-no discharge Lungs-respiratory rate normal, CTA CV-no murmurs,RRR Extremities skin warm dry no edema Neuro grossly normal Behavior normal, alert        Assessment & Plan:  1. Pain of right hip joint She is having significant right hip joint pain.  She had Indocin leftover.  She is utilizing this on a as needed basis she does not take this with ibuprofen.  And she states it does not bother her stomach the way that ibuprofen does she is on a PPI.  2. Spondylosis, cervical, with myelopathy C5-6 She has chronic neck pain and discomfort on oxycodone She denies abusing the pain medicine The patient was seen in followup for chronic pain. A review over at their current pain status was discussed. Drug registry was checked. Prescriptions were given.  Regular follow-up recommended. Discussion was held regarding the importance of compliance with medication as well as pain medication contract.  Patient was informed that medication may cause drowsiness and should not be  combined  with other medications/alcohol or street drugs. If the patient feels medication is causing altered alertness then do not drive or operate dangerous equipment.  Should be noted that the patient appears to be meeting appropriate use of opioids and response.  Evidenced by improved function and decent pain control without significant side effects and no evidence of overt aberrancy issues.  Upon discussion with the  patient today they understand that opioid therapy is optional and they feel that the pain has been refractory to reasonable conservative measures and is significant and affecting quality of life enough to warrant ongoing therapy and wishes to continue opioids.  Refills were provided.   3. Generalized anxiety disorder Patient has been on Xanax for years.  We have tried to taper down the lowest dose we have been able to get to 0.5 mg twice daily She compensates well with this does not cause drowsiness Tapering further off of this would increase deterioration in the patient clinically compared to leaving on this current dosing  4. Chronic bilateral low back pain, unspecified whether sciatica present Pain medication as discussed above  5. Iron deficiency anemia, unspecified iron deficiency anemia type Check lab work. - Ferritin - Iron Binding Cap (TIBC)([facility]/Sun[facility])  6. Other specified hypothyroidism Continue thyroid medicine check lab work - TSH - T4, free  7. HTN (hypertension), benign Blood pressure good control continue current measures minimize salt intake - Basic metabolic panel  8. Encounter for long-term opiate analgesic use Please see above regarding chronic pain - ToxASSURE Select 13 (MW), Urine  9. Other hyperlipidemia Hyperlipidemia healthy diet recommended - Lipid panel  10. High risk medication use High risk medications check kidney functions - Basic metabolic panel

## 2021-11-05 ENCOUNTER — Other Ambulatory Visit: Payer: Self-pay

## 2021-11-05 LAB — MED LIST ATTACHED SEPARATELY

## 2021-11-05 MED ORDER — INDOMETHACIN 50 MG PO CAPS: 50.0000 mg | ORAL_CAPSULE | Freq: Three times a day (TID) | ORAL | Status: DC

## 2021-11-13 LAB — TOXASSURE SELECT 13 (MW), URINE

## 2021-11-13 LAB — SPECIMEN STATUS REPORT

## 2021-11-24 ENCOUNTER — Telehealth: Payer: Medicare Other | Admitting: Physician Assistant

## 2021-11-24 DIAGNOSIS — J208 Acute bronchitis due to other specified organisms: Secondary | ICD-10-CM | POA: Diagnosis not present

## 2021-11-24 DIAGNOSIS — B9689 Other specified bacterial agents as the cause of diseases classified elsewhere: Secondary | ICD-10-CM | POA: Diagnosis not present

## 2021-11-24 MED ORDER — DOXYCYCLINE HYCLATE 100 MG PO TABS
100.0000 mg | ORAL_TABLET | Freq: Two times a day (BID) | ORAL | 0 refills | Status: DC
Start: 2021-11-24 — End: 2022-02-02

## 2021-11-24 NOTE — Progress Notes (Signed)
I have spent 5 minutes in review of e-visit questionnaire, review and updating patient chart, medical decision making and response to patient.   Twanda Stakes Cody Jakobie Henslee, PA-C    

## 2021-11-24 NOTE — Progress Notes (Signed)
We are sorry that you are not feeling well.  Here is how we plan to help!  Based on your presentation I believe you most likely have A cough due to bacteria.   I have prescribed Doxycycline 100 mg twice a day for 7 days     In addition you may use A non-prescription cough medication called Mucinex DM: take 2 tablets every 12 hours. Since you cannot tolerate cough syrups and the prescription Tessalon Perles do not help.   From your responses in the eVisit questionnaire you describe inflammation in the upper respiratory tract which is causing a significant cough.  This is commonly called Bronchitis and has four common causes:   Allergies Viral Infections Acid Reflux Bacterial Infection Allergies, viruses and acid reflux are treated by controlling symptoms or eliminating the cause. An example might be a cough caused by taking certain blood pressure medications. You stop the cough by changing the medication. Another example might be a cough caused by acid reflux. Controlling the reflux helps control the cough.  USE OF BRONCHODILATOR ("RESCUE") INHALERS: There is a risk from using your bronchodilator too frequently.  The risk is that over-reliance on a medication which only relaxes the muscles surrounding the breathing tubes can reduce the effectiveness of medications prescribed to reduce swelling and congestion of the tubes themselves.  Although you feel brief relief from the bronchodilator inhaler, your asthma may actually be worsening with the tubes becoming more swollen and filled with mucus.  This can delay other crucial treatments, such as oral steroid medications. If you need to use a bronchodilator inhaler daily, several times per day, you should discuss this with your provider.  There are probably better treatments that could be used to keep your asthma under control.     HOME CARE Only take medications as instructed by your medical team. Complete the entire course of an antibiotic. Drink  plenty of fluids and get plenty of rest. Avoid close contacts especially the very young and the elderly Cover your mouth if you cough or cough into your sleeve. Always remember to wash your hands A steam or ultrasonic humidifier can help congestion.   GET HELP RIGHT AWAY IF: You develop worsening fever. You become short of breath You cough up blood. Your symptoms persist after you have completed your treatment plan MAKE SURE YOU  Understand these instructions. Will watch your condition. Will get help right away if you are not doing well or get worse.    Thank you for choosing an e-visit.  Your e-visit answers were reviewed by a board certified advanced clinical practitioner to complete your personal care plan. Depending upon the condition, your plan could have included both over the counter or prescription medications.  Please review your pharmacy choice. Make sure the pharmacy is open so you can pick up prescription now. If there is a problem, you may contact your provider through [employer] and have the prescription routed to another pharmacy.  Your safety is important to [place]. If you have drug allergies check your prescription carefully.   For the next 24 hours you can use MyChart to ask questions about today's visit, request a non-urgent call back, or ask for a work or school excuse. You will get an email in the next two days asking about your experience. I hope that your e-visit has been valuable and will speed your recovery.

## 2022-02-02 ENCOUNTER — Ambulatory Visit (INDEPENDENT_AMBULATORY_CARE_PROVIDER_SITE_OTHER): Payer: Medicare Other | Admitting: Family Medicine

## 2022-02-02 ENCOUNTER — Other Ambulatory Visit: Payer: Self-pay

## 2022-02-02 VITALS — BP 105/62 | Ht 65.5 in | Wt 192.2 lb

## 2022-02-02 DIAGNOSIS — E559 Vitamin D deficiency, unspecified: Secondary | ICD-10-CM

## 2022-02-02 DIAGNOSIS — I10 Essential (primary) hypertension: Secondary | ICD-10-CM | POA: Diagnosis not present

## 2022-02-02 DIAGNOSIS — M545 Low back pain, unspecified: Secondary | ICD-10-CM

## 2022-02-02 DIAGNOSIS — E538 Deficiency of other specified B group vitamins: Secondary | ICD-10-CM

## 2022-02-02 DIAGNOSIS — Z79891 Long term (current) use of opiate analgesic: Secondary | ICD-10-CM | POA: Diagnosis not present

## 2022-02-02 DIAGNOSIS — R635 Abnormal weight gain: Secondary | ICD-10-CM

## 2022-02-02 DIAGNOSIS — F411 Generalized anxiety disorder: Secondary | ICD-10-CM

## 2022-02-02 DIAGNOSIS — E038 Other specified hypothyroidism: Secondary | ICD-10-CM

## 2022-02-02 DIAGNOSIS — G8929 Other chronic pain: Secondary | ICD-10-CM

## 2022-02-02 MED ORDER — OXYCODONE HCL 5 MG PO TABS: ORAL_TABLET | ORAL | 0 refills | Status: DC

## 2022-02-02 MED ORDER — PRAVASTATIN SODIUM 20 MG PO TABS: 20.0000 mg | ORAL_TABLET | Freq: Every day | ORAL | 1 refills | Status: DC

## 2022-02-02 MED ORDER — OXYCODONE HCL 5 MG PO TABS: 5.0000 mg | ORAL_TABLET | ORAL | 0 refills | Status: DC | PRN

## 2022-02-02 MED ORDER — POTASSIUM CHLORIDE ER 10 MEQ PO TBCR
EXTENDED_RELEASE_TABLET | ORAL | 1 refills | Status: DC
Start: 2022-02-02 — End: 2022-11-07

## 2022-02-02 MED ORDER — HYDROCHLOROTHIAZIDE 25 MG PO TABS: 25.0000 mg | ORAL_TABLET | Freq: Every day | ORAL | 1 refills | Status: DC

## 2022-02-02 MED ORDER — ALPRAZOLAM 0.5 MG PO TABS
ORAL_TABLET | ORAL | 5 refills | Status: DC
Start: 2022-02-02 — End: 2022-08-08

## 2022-02-02 MED ORDER — LISINOPRIL 2.5 MG PO TABS
ORAL_TABLET | ORAL | 1 refills | Status: DC
Start: 2022-02-02 — End: 2022-08-12

## 2022-02-02 NOTE — Progress Notes (Signed)
? ?Subjective:  ? ? Patient ID: Amanda Osborne, female    DOB: 1951-05-14, 71 y.o.   MRN: [id number] ? ?HPI ? ?This patient was seen today for chronic pain ? ?The medication list was reviewed and updated. ? ?Location of Pain for which the patient has been treated with regarding narcotics: joint pai ? ?Onset of this pain: years ? ? -Compliance with medication: years ? ?- Number patient states they take daily: 6 pills a day ? ?-when was the last dose patient took? today ? ?The patient was advised the importance of maintaining medication and not using illegal substances with these. ? ?Here for refills and follow up ? ?The patient was educated that we can provide 3 monthly scripts for their medication, it is their responsibility to follow the instructions. ? ?Side effects or complications from medications: none ? ?Patient is aware that pain medications are meant to minimize the severity of the pain to allow their pain levels to improve to allow for better function. They are aware of that pain medications cannot totally remove their pain. ? ?Due for UDT ( at least once per year) : 10/2021 ? ?Patient would like to discuss weight and metabolism- patient states with her gastric bypass she used to be able to eat anything se wanted in small supply and not gain weight bu since christmas she cant eat anything without gaining weight -patient states she has been on strict diet barely eating and not losing weight and the hunger has become intense ? ?. ?Encounter for long-term opiate analgesic use ? ?HTN (hypertension), benign ? ?Other specified hypothyroidism ? ?Chronic bilateral low back pain, unspecified whether sciatica present ? ?Vitamin D deficiency - Plan: VITAMIN D 25 Hydroxy (Vit-D Deficiency, Fractures) ? ?Vitamin B 12 deficiency - Plan: Vitamin B12 ? ?GAD (generalized anxiety disorder) ? ?Weight gain ? ?Flex patient ?She is very nice ?Blood pressure has been under good control with her medicine ?Takes her thyroid  medicine regular basis ?Chronic back pain and discomfort and chronic neck pain for which she uses her pain medicine ?She does benefit from the pain medicine allows her to be more functional ?Continue current pain medicine ?Has history of vitamin D deficiency needs a recheck this has history of vitamin B 12 deficiency check this ?Has generalized anxiety disorder takes Xanax we have wiggled this down to just 2/day currently right now she is able to tolerate the Xanax and the pain medicine and not have any drowsiness ?She has had weight gain but more than likely this is just related to age and inactivity and increase appetite recommended healthy eating as best as possible ? ? ?  ? ? ?Review of Systems ? ?   ?Objective:  ? Physical Exam ? ?General-in no acute distress ?Eyes-no discharge ?Lungs-respiratory rate normal, CTA ?CV-no murmurs,RRR ?Extremities skin warm dry no edema ?Neuro grossly normal ?Behavior normal, alert ? ? ? ?   ?Assessment & Plan:  ?1. Encounter for long-term opiate analgesic use ?The patient was seen in followup for chronic pain. ?A review over at their current pain status was discussed. Drug registry was checked. ?Prescriptions were given.  Regular follow-up recommended. ?Discussion was held regarding the importance of compliance with medication as well as pain medication contract. ? ?Patient was informed that medication may cause drowsiness and should not be combined  with other medications/alcohol or street drugs. If the patient feels medication is causing altered alertness then do not drive or operate dangerous equipment. ? ?Should be noted that  the patient appears to be meeting appropriate use of opioids and response.  Evidenced by improved function and decent pain control without significant side effects and no evidence of overt aberrancy issues.  Upon discussion with the patient today they understand that opioid therapy is optional and they feel that the pain has been refractory to reasonable  conservative measures and is significant and affecting quality of life enough to warrant ongoing therapy and wishes to continue opioids.  Refills were provided. ?Drug registry was checked ?3 prescription sent in ?Patient encouraged to adhere to the current regimen ?Follow-up sooner if any problems ? ? ?2. HTN (hypertension), benign ?Blood pressure very good control watch salt in diet try to stay as active as possible ? ?3. Other specified hypothyroidism ?Continue thyroid medicine.  Stay physically active ? ?4. Chronic bilateral low back pain, unspecified whether sciatica present ?Pain medicine as stated above ? ?5. Vitamin D deficiency ?Check vitamin D level ?- VITAMIN D 25 Hydroxy (Vit-D Deficiency, Fractures) ? ?6. Vitamin B 12 deficiency ?Check B12 level ?- Vitamin B12 ? ?7. GAD (generalized anxiety disorder) ?May continue the Xanax twice daily as she denies it causing drowsiness she has been on this for years in my opinion it would be detrimental to try to stop this we have tried to taper off of this in the past and from a anxiety and quality of life standpoint and did not go well ? ?8. Weight gain ?Weight gain more than likely related to age metabolism changes very important for healthy diet regular activity ?She will do her lab work we will let her know the results ? ?

## 2022-02-03 ENCOUNTER — Ambulatory Visit: Payer: Medicare Other | Admitting: Family Medicine

## 2022-02-13 ENCOUNTER — Other Ambulatory Visit: Payer: Self-pay | Admitting: Family Medicine

## 2022-02-15 ENCOUNTER — Other Ambulatory Visit: Payer: Self-pay | Admitting: Family Medicine

## 2022-02-17 ENCOUNTER — Telehealth: Payer: Self-pay | Admitting: *Deleted

## 2022-02-17 NOTE — Chronic Care Management (AMB) (Signed)
?  Care Management  ? ?Note ? ?02/17/2022 ?Name: [name] MRN: [id number] DOB: 11-Mar-1951 ? ?Amanda Osborne is a 71 y.o. year old female who is a primary care patient of Luking, Elayne Snare, MD and is actively engaged with the care management team. I reached out to [name] by phone today to assist with scheduling an initial visit with the RN Case Manager. ? ?Follow up plan: ?Patient declines further follow up and engagement by the care management team. Appropriate care team members and provider have been notified via electronic communication. The care management team is available to follow up with the patient after provider conversation with the patient regarding recommendation for care management engagement and subsequent re-referral to the care management team.  ? ?Laverda Sorenson  ?Care Guide, Embedded Care Coordination ?[facility]  ?Direct Dial: [phone] ? ?

## 2022-03-18 ENCOUNTER — Telehealth: Payer: Medicare Other | Admitting: Emergency Medicine

## 2022-03-18 DIAGNOSIS — B9689 Other specified bacterial agents as the cause of diseases classified elsewhere: Secondary | ICD-10-CM

## 2022-03-18 DIAGNOSIS — J019 Acute sinusitis, unspecified: Secondary | ICD-10-CM | POA: Diagnosis not present

## 2022-03-18 MED ORDER — AMOXICILLIN-POT CLAVULANATE 400-57 MG/5ML PO SUSR: 875.0000 mg | Freq: Two times a day (BID) | ORAL | 0 refills | Status: AC

## 2022-03-18 NOTE — Progress Notes (Signed)
E-Visit for Sinus Problems ? ?We are sorry that you are not feeling well.  Here is how we plan to help! ? ?Based on what you have shared with me it looks like you have sinusitis.  Sinusitis is inflammation and infection in the sinus cavities of the head.  Based on your presentation I believe you most likely have Acute Bacterial Sinusitis.  This is an infection caused by bacteria and is treated with antibiotics. I have prescribed Augmentin liquid twice a day for 7 days; augmentin doesn't come in capsules and the tablets are very large. ? ? You may use an oral decongestant such as Mucinex D or if you have glaucoma or high blood pressure use plain Mucinex. Saline nasal spray help and can safely be used as often as needed for congestion.  If you develop worsening sinus pain, fever or notice severe headache and vision changes, or if symptoms are not better after completion of antibiotic, please schedule an appointment with a health care provider.   ? ?Sinus infections are not as easily transmitted as other respiratory infection, however we still recommend that you avoid close contact with loved ones, especially the very young and elderly.  Remember to wash your hands thoroughly throughout the day as this is the number one way to prevent the spread of infection! ? ?Home Care: ?Only take medications as instructed by your medical team. ?Complete the entire course of an antibiotic. ?Do not take these medications with alcohol. ?A steam or ultrasonic humidifier can help congestion.  You can place a towel over your head and breathe in the steam from hot water coming from a faucet. ?Avoid close contacts especially the very young and the elderly. ?Cover your mouth when you cough or sneeze. ?Always remember to wash your hands. ? ?Get Help Right Away If: ?You develop worsening fever or sinus pain. ?You develop a severe head ache or visual changes. ?Your symptoms persist after you have completed your treatment plan. ? ?Make sure  you ?Understand these instructions. ?Will watch your condition. ?Will get help right away if you are not doing well or get worse. ? ?Thank you for choosing an e-visit. ? ?Your e-visit answers were reviewed by a board certified advanced clinical practitioner to complete your personal care plan. Depending upon the condition, your plan could have included both over the counter or prescription medications. ? ?Please review your pharmacy choice. Make sure the pharmacy is open so you can pick up prescription now. If there is a problem, you may contact your provider through [employer] and have the prescription routed to another pharmacy.  Your safety is important to [place]. If you have drug allergies check your prescription carefully.  ? ?For the next 24 hours you can use MyChart to ask questions about today's visit, request a non-urgent call back, or ask for a work or school excuse. ?You will get an email in the next two days asking about your experience. I hope that your e-visit has been valuable and will speed your recovery. ? ?I have spent 5 minutes in review of e-visit questionnaire, review and updating patient chart, medical decision making and response to patient.  ? ?Willeen Cass, PhD, FNP-BC ?  ? ?

## 2022-03-21 ENCOUNTER — Other Ambulatory Visit: Payer: Self-pay

## 2022-03-21 MED ORDER — FLUTICASONE PROPIONATE 50 MCG/ACT NA SUSP: NASAL | 9 refills | Status: DC

## 2022-03-25 DIAGNOSIS — J018 Other acute sinusitis: Secondary | ICD-10-CM | POA: Diagnosis not present

## 2022-03-25 DIAGNOSIS — H6121 Impacted cerumen, right ear: Secondary | ICD-10-CM | POA: Diagnosis not present

## 2022-03-25 DIAGNOSIS — H6123 Impacted cerumen, bilateral: Secondary | ICD-10-CM | POA: Diagnosis not present

## 2022-03-28 ENCOUNTER — Telehealth: Payer: Medicare Other

## 2022-05-03 ENCOUNTER — Other Ambulatory Visit: Payer: Self-pay

## 2022-05-03 MED ORDER — LEVOTHYROXINE SODIUM 25 MCG PO TABS: ORAL_TABLET | ORAL | 0 refills | Status: DC

## 2022-05-10 ENCOUNTER — Ambulatory Visit: Payer: Self-pay | Admitting: Family Medicine

## 2022-05-11 ENCOUNTER — Telehealth: Payer: Self-pay

## 2022-05-11 ENCOUNTER — Other Ambulatory Visit: Payer: Self-pay | Admitting: Family Medicine

## 2022-05-11 MED ORDER — OXYCODONE HCL 5 MG PO TABS: 5.0000 mg | ORAL_TABLET | ORAL | 0 refills | Status: DC | PRN

## 2022-05-11 NOTE — Telephone Encounter (Signed)
Caller name:Amanda Osborne   On DPR? :No  Call back number:[phone]  Provider they see: Luking   Reason for call:Pt called said she never misses appt and that she never received a notification of reminder of her appt we are not able to schedule her a med check appt until 07/28 she is wanting to know if Dr Nicki Reaper will be able to call her oxyCODONE (OXY IR/ROXICODONE) 5 MG immediate release tablet  in for July 1st since she missed her appt?

## 2022-05-11 NOTE — Telephone Encounter (Signed)
Nurses I did refill her medicine Please see if her appointment can be moved up to earlier in July so that we can be more in compliance with state standards regarding pain medicine refills thank you  You may use one of my open slots

## 2022-05-12 NOTE — Telephone Encounter (Signed)
Patient informed that prescription was sent in and appt moved to 05/23/22 at 10:20 am

## 2022-05-16 ENCOUNTER — Telehealth: Payer: Self-pay | Admitting: *Deleted

## 2022-05-16 NOTE — Telephone Encounter (Signed)
Patient called and stated that she needed an EKG. She wanted an appointment for tomorrow.  I asked her what was going on and she stated "something is going on with my heart" and I asked if she could come in today. She says I can't today and starts to cry, saying that she couldn't go into details why she couldn't do today and she shouldn't have called.  I told her that she really needed to be seen if something was wrong with her heart, if she couldn't come here today, then go to ER or Urgent Care. She said thank you and hung up.

## 2022-05-17 ENCOUNTER — Other Ambulatory Visit (HOSPITAL_COMMUNITY)
Admission: RE | Admit: 2022-05-17 | Discharge: 2022-05-17 | Disposition: A | Discharge status: Home / Self Care | Payer: Medicare Other | Source: Ambulatory Visit | Attending: Family Medicine | Admitting: Family Medicine

## 2022-05-17 ENCOUNTER — Ambulatory Visit (HOSPITAL_COMMUNITY)
Admission: RE | Admit: 2022-05-17 | Discharge: 2022-05-17 | Disposition: A | Discharge status: Home / Self Care | Payer: Medicare Other | Source: Ambulatory Visit | Attending: Family Medicine | Admitting: Family Medicine

## 2022-05-17 ENCOUNTER — Ambulatory Visit (INDEPENDENT_AMBULATORY_CARE_PROVIDER_SITE_OTHER): Payer: Medicare Other | Admitting: Family Medicine

## 2022-05-17 VITALS — BP 110/72 | HR 89 | Temp 97.7°F | Ht 65.5 in | Wt 186.0 lb

## 2022-05-17 DIAGNOSIS — R0609 Other forms of dyspnea: Secondary | ICD-10-CM

## 2022-05-17 DIAGNOSIS — R072 Precordial pain: Secondary | ICD-10-CM | POA: Diagnosis not present

## 2022-05-17 DIAGNOSIS — R0602 Shortness of breath: Secondary | ICD-10-CM | POA: Diagnosis not present

## 2022-05-17 DIAGNOSIS — K219 Gastro-esophageal reflux disease without esophagitis: Secondary | ICD-10-CM

## 2022-05-17 DIAGNOSIS — K449 Diaphragmatic hernia without obstruction or gangrene: Secondary | ICD-10-CM

## 2022-05-17 DIAGNOSIS — Z9889 Other specified postprocedural states: Secondary | ICD-10-CM | POA: Diagnosis not present

## 2022-05-17 LAB — BASIC METABOLIC PANEL
Anion gap: 10 (ref 5–15)
BUN: 30 mg/dL — ABNORMAL HIGH (ref 8–23)
CO2: 25 mmol/L (ref 22–32)
Calcium: 9.4 mg/dL (ref 8.9–10.3)
Chloride: 101 mmol/L (ref 98–111)
Creatinine, Ser: 0.66 mg/dL (ref 0.44–1.00)
GFR, Estimated: >60 mL/min (ref >60)
Glucose, Bld: 112 mg/dL — ABNORMAL HIGH (ref 70–99)
Potassium: 4.1 mmol/L (ref 3.5–5.1)
Sodium: 136 mmol/L (ref 135–145)

## 2022-05-17 LAB — D-DIMER, QUANTITATIVE: D-Dimer, Quant: <0.27 ug/mL-FEU (ref 0.00–0.50)

## 2022-05-17 LAB — BRAIN NATRIURETIC PEPTIDE: B Natriuretic Peptide: 32 pg/mL (ref 0.0–100.0)

## 2022-05-17 LAB — TROPONIN I (HIGH SENSITIVITY): Troponin I (High Sensitivity): 3 ng/L (ref <18)

## 2022-05-17 LAB — C-REACTIVE PROTEIN: CRP: 1.5 mg/dL — ABNORMAL HIGH (ref <1.0)

## 2022-05-17 MED ORDER — PANTOPRAZOLE SODIUM 40 MG PO TBEC: 40.0000 mg | DELAYED_RELEASE_TABLET | Freq: Every day | ORAL | 3 refills | Status: DC

## 2022-05-17 NOTE — Telephone Encounter (Signed)
Patient coming later this afternoon patient was talked with

## 2022-05-17 NOTE — Progress Notes (Signed)
   Subjective:    Patient ID: Amanda Osborne, female    DOB: February 16, 1951, 71 y.o.   MRN: [id number]  HPI Tightness and chest pain located between breasts x 1 week  Also reports SOB  Intermittent intermittent sharp chest pain at the lower part of her chest where it meets the abdomen at times has heartburn issues.  At times feels short of breath.  Also relates reflux issues and thinks she has gastritis issues.  Review of Systems     Objective:   Physical Exam  General-in no acute distress Eyes-no discharge Lungs-respiratory rate normal, CTA CV-no murmurs,RRR Extremities skin warm dry no edema Neuro grossly normal Behavior normal, alert       Assessment & Plan:   1. SOB (shortness of breath) It is hard to know if this chest pain is cardiac to EKG looks good lab work looks good we will move forward with referral hernia.  Patient has a follow-up visit if still having shortness of breath chest pains consider cardiology consult - EKG 12-Lead - Brain natriuretic peptide - Troponin I - D-dimer, quantitative - Basic metabolic panel - C-reactive protein - DG Chest 2 View  2. DOE (dyspnea on exertion) Negative for pulmonary embolus - Brain natriuretic peptide - Troponin I - D-dimer, quantitative - Basic metabolic panel - C-reactive protein - DG Chest 2 View  3. Precordial chest pain Low likelihood of cardiac given that sharp pain not related to activity - Brain natriuretic peptide - Troponin I - D-dimer, quantitative - Basic metabolic panel - C-reactive protein - DG Chest 2 View  4. Gastroesophageal reflux disease without esophagitis Gastritis related issues start pantoprazole x-ray does show hiatal hernia - Brain natriuretic peptide - Troponin I - D-dimer, quantitative - Basic metabolic panel - C-reactive protein - DG Chest 2 View  5. Hiatal hernia Hiatal hernia healthy diet discussed more on Monday regarding possible referral

## 2022-05-23 ENCOUNTER — Ambulatory Visit (INDEPENDENT_AMBULATORY_CARE_PROVIDER_SITE_OTHER): Payer: Medicare Other | Admitting: Family Medicine

## 2022-05-23 ENCOUNTER — Encounter: Payer: Self-pay | Admitting: Family Medicine

## 2022-05-23 VITALS — BP 95/70 | Temp 96.6°F | Wt 184.0 lb

## 2022-05-23 DIAGNOSIS — Z79891 Long term (current) use of opiate analgesic: Secondary | ICD-10-CM

## 2022-05-23 DIAGNOSIS — M4712 Other spondylosis with myelopathy, cervical region: Secondary | ICD-10-CM

## 2022-05-23 DIAGNOSIS — K449 Diaphragmatic hernia without obstruction or gangrene: Secondary | ICD-10-CM

## 2022-05-23 MED ORDER — OXYCODONE HCL 5 MG PO TABS: ORAL_TABLET | ORAL | 0 refills | Status: DC

## 2022-05-23 MED ORDER — FAMOTIDINE 20 MG PO TABS
20.0000 mg | ORAL_TABLET | Freq: Two times a day (BID) | ORAL | 1 refills | Status: DC | PRN
Start: 2022-05-23 — End: 2024-02-13

## 2022-05-23 MED ORDER — OXYCODONE HCL 5 MG PO TABS: 5.0000 mg | ORAL_TABLET | ORAL | 0 refills | Status: DC | PRN

## 2022-05-23 NOTE — Progress Notes (Signed)
Referral ordered in EPIC. 

## 2022-05-23 NOTE — Progress Notes (Unsigned)
   Subjective:    Patient ID: Amanda Osborne, female    DOB: 11/17/1951, 71 y.o.   MRN: [id number]  HPI This patient was seen today for chronic pain  The medication list was reviewed and updated.  Location of Pain for which the patient has been treated with regarding narcotics:   Onset of this pain:    -Compliance with medication: Oxycodone 5 mg  - Number patient states they take daily: 6  -when was the last dose patient took? 9 a.m  The patient was advised the importance of maintaining medication and not using illegal substances with these.  Here for refills and follow up  The patient was educated that we can provide 3 monthly scripts for their medication, it is their responsibility to follow the instructions.  Side effects or complications from medications: none  Patient is aware that pain medications are meant to minimize the severity of the pain to allow their pain levels to improve to allow for better function. They are aware of that pain medications cannot totally remove their pain.  Due for UDT ( at least once per year) : 12/15/20222  Scale of 1 to 10 ( 1 is least 10 is most) Your pain level without the medicine: 8 Your pain level with medication: 2  Scale 1 to 10 ( 1-helps very little, 10 helps very well) How well does your pain medication reduce your pain so you can function better through out the day? 9  Quality of the pain:   Persistence of the pain:   Modifying factors:   Pt also following up on chest x-ray that showed hiatal hernia.   Teeth issues     Review of Systems     Objective:   Physical Exam  General-in no acute distress Eyes-no discharge Lungs-respiratory rate normal, CTA CV-no murmurs,RRR Extremities skin warm dry no edema Neuro grossly normal Behavior normal, alert       Assessment & Plan:   1. Hiatal hernia Referral to general surgery for further evaluation may need to have surgery on this but more than likely they will  watch it unless it causes more trouble  2. Encounter for long-term opiate analgesic use The patient was seen in followup for chronic pain. A review over at their current pain status was discussed. Drug registry was checked. Prescriptions were given.  Regular follow-up recommended. Discussion was held regarding the importance of compliance with medication as well as pain medication contract.  Patient was informed that medication may cause drowsiness and should not be combined  with other medications/alcohol or street drugs. If the patient feels medication is causing altered alertness then do not drive or operate dangerous equipment.  Should be noted that the patient appears to be meeting appropriate use of opioids and response.  Evidenced by improved function and decent pain control without significant side effects and no evidence of overt aberrancy issues.  Upon discussion with the patient today they understand that opioid therapy is optional and they feel that the pain has been refractory to reasonable conservative measures and is significant and affecting quality of life enough to warrant ongoing therapy and wishes to continue opioids.  Refills were provided. 3 prescriptions were sent in drug registry was checked medication does improve her overall quality of care and health so therefore continue current medicine  3. Spondylosis, cervical, with myelopathy C5-6 Has chronic pain associated with this but nortriptyline along with pain medicine helps

## 2022-05-25 ENCOUNTER — Encounter: Payer: Self-pay | Admitting: Family Medicine

## 2022-05-30 ENCOUNTER — Other Ambulatory Visit: Payer: Self-pay

## 2022-05-30 MED ORDER — LEVOTHYROXINE SODIUM 25 MCG PO TABS: ORAL_TABLET | ORAL | 0 refills | Status: DC

## 2022-06-07 ENCOUNTER — Encounter: Payer: Self-pay | Admitting: Family Medicine

## 2022-06-07 ENCOUNTER — Ambulatory Visit (INDEPENDENT_AMBULATORY_CARE_PROVIDER_SITE_OTHER): Payer: Medicare Other | Admitting: Family Medicine

## 2022-06-07 DIAGNOSIS — J320 Chronic maxillary sinusitis: Secondary | ICD-10-CM | POA: Insufficient documentation

## 2022-06-07 DIAGNOSIS — J0100 Acute maxillary sinusitis, unspecified: Secondary | ICD-10-CM

## 2022-06-07 MED ORDER — DOXYCYCLINE HYCLATE 100 MG PO TABS: 100.0000 mg | ORAL_TABLET | Freq: Two times a day (BID) | ORAL | 0 refills | Status: DC

## 2022-06-07 NOTE — Progress Notes (Signed)
Subjective:  Patient ID: Amanda Osborne, female    DOB: 1950-11-22  Age: 71 y.o. MRN: [id number]  CC: Chief Complaint  Patient presents with   Sinus Problem    Pt arrives with sinus pressure mainly on right side, slight fever at night, right ear ache at night, teeth feel full on right side (pt went to dentist earlier), cough at night and loss of voice at times.     HPI:  71 year old female presents for evaluation of the above.  Patient reports that she has not been feeling well over the past 3 weeks.  She reports sinus pain and pressure.  Associated congestion.  She states that her discomfort is located in the right maxillary region.  She reports associated right upper dental discomfort.  Subjective fever at night.  She also reports some ear discomfort.  She has recently seen a dentist.  Is also reports some cough at night.  Patient has been taking over-the-counter medication without relief.  Patient believes that she has sinusitis.  No other associated symptoms.  No other complaints.  Patient Active Problem List   Diagnosis Date Noted   Maxillary sinusitis 06/07/2022   Left carpal tunnel syndrome 11/07/2018   Incisional hernia 06/14/2018   Hypothyroid 02/27/2018   IDA (iron deficiency anemia) 08/10/2017   GERD (gastroesophageal reflux disease) 05/01/2015   Osteoporosis 12/22/2014   Occipital neuralgia 09/16/2014   HTN (hypertension), benign 03/17/2014   Thyroid nodule 03/17/2014   Spondylosis, cervical, with myelopathy C5-6 08/06/2013   Kyphosis 05/02/2013   Hyperlipidemia 05/02/2013   Chronic back pain 03/29/2013   Generalized anxiety disorder 03/29/2013   Depression with anxiety 03/29/2013   H/O gastric bypass 03/29/2013   Vitamin D deficiency 03/26/2013    Social Hx   Social History   Socioeconomic History   Marital status: Divorced    Spouse name: Not on file   Number of children: 1   Years of education: HS   Highest education level: Not on file  Occupational  History   Occupation: retired  Tobacco Use   Smoking status: Never   Smokeless tobacco: Never  Vaping Use   Vaping Use: Never used  Substance and Sexual Activity   Alcohol use: No   Drug use: No   Sexual activity: Not Currently    Birth control/protection: Post-menopausal  Other Topics Concern   Not on file  Social History Narrative   Patient lives at home alone.   Caffeine Use: daily (BC podwder)   Social Determinants of Health   Financial Resource Strain: Medium Risk (08/14/2021)   Overall Financial Resource Strain (CARDIA)    Difficulty of Paying Living Expenses: Somewhat hard  Food Insecurity: No Food Insecurity (08/14/2021)   Hunger Vital Sign    Worried About Running Out of Food in the Last Year: Never true    Ran Out of Food in the Last Year: Never true  Transportation Needs: No Transportation Needs (08/14/2021)   PRAPARE - Hydrologist (Medical): No    Lack of Transportation (Non-Medical): No  Physical Activity: Sufficiently Active (08/14/2021)   Exercise Vital Sign    Days of Exercise per Week: 3 days    Minutes of Exercise per Session: 130 min  Stress: Stress Concern Present (08/14/2021)   [place]    Feeling of Stress : To some extent  Social Connections: Socially Isolated (08/14/2021)   Social Connection and Isolation Panel [NHANES]  Frequency of Communication with Friends and Family: Once a week    Frequency of Social Gatherings with Friends and Family: Once a week    Attends Religious Services: Never    Printmaker: No    Attends Music therapist: Never    Marital Status: Divorced    Review of Systems Per HPI  Objective:  BP (!) 146/83   Pulse 96   Temp 98 F (36.7 C)   Wt 183 lb 3.2 oz (83.1 kg)   SpO2 95%   BMI 30.02 kg/m      06/07/2022    4:24 PM 05/23/2022   10:28 AM 05/17/2022    3:50 PM  BP/Weight   Systolic BP 330 95 076  Diastolic BP 83 70 72  Wt. (Lbs) 183.2 184 186  BMI 30.02 kg/m2 30.15 kg/m2 30.48 kg/m2    Physical Exam Vitals and nursing note reviewed.  Constitutional:      General: She is not in acute distress.    Appearance: Normal appearance.  HENT:     Head: Normocephalic and atraumatic.     Right Ear: Tympanic membrane normal.     Left Ear: Tympanic membrane normal.     Nose:     Comments: Right maxillary sinus tenderness to palpation. Eyes:     General:        Right eye: No discharge.        Left eye: No discharge.     Conjunctiva/sclera: Conjunctivae normal.  Cardiovascular:     Rate and Rhythm: Normal rate and regular rhythm.     Heart sounds: No murmur heard. Pulmonary:     Effort: Pulmonary effort is normal.     Breath sounds: Normal breath sounds. No wheezing, rhonchi or rales.  Neurological:     Mental Status: She is alert.  Psychiatric:        Mood and Affect: Mood normal.        Behavior: Behavior normal.     Lab Results  Component Value Date   WBC 4.6 05/04/2021   HGB 11.8 05/04/2021   HCT 37.3 05/04/2021   PLT 290 05/04/2021   GLUCOSE 112 (H) 05/17/2022   CHOL 180 05/04/2021   TRIG 115 05/04/2021   HDL 62 05/04/2021   LDLCALC 98 05/04/2021   ALT 17 10/19/2020   AST 19 10/19/2020   NA 136 05/17/2022   K 4.1 05/17/2022   CL 101 05/17/2022   CREATININE 0.66 05/17/2022   BUN 30 (H) 05/17/2022   CO2 25 05/17/2022   TSH 2.290 05/04/2021   INR 1.0 06/12/2008   HGBA1C 4.4 05/01/2015     Assessment & Plan:   Problem List Items Addressed This Visit       Respiratory   Maxillary sinusitis    Treating with doxycycline.  Patient states that she prefers smaller tablets due to difficulty swallowing larger tablets.  Therefore Augmentin was not used and doxycycline was used in its place.  Advised to take with food.      Relevant Medications   doxycycline (VIBRA-TABS) 100 MG tablet    Meds ordered this encounter  Medications    doxycycline (VIBRA-TABS) 100 MG tablet    Sig: Take 1 tablet (100 mg total) by mouth 2 (two) times daily.    Dispense:  14 tablet    Refill:  [place]

## 2022-06-07 NOTE — Assessment & Plan Note (Signed)
Treating with doxycycline.  Patient states that she prefers smaller tablets due to difficulty swallowing larger tablets.  Therefore Augmentin was not used and doxycycline was used in its place.  Advised to take with food.

## 2022-06-07 NOTE — Patient Instructions (Signed)
Continue claritin.  Take the antibiotic with food.  Call or message with concerns.  Take care  Dr. Lacinda Axon

## 2022-06-17 ENCOUNTER — Ambulatory Visit: Payer: Medicare Other | Admitting: Family Medicine

## 2022-08-08 ENCOUNTER — Ambulatory Visit (INDEPENDENT_AMBULATORY_CARE_PROVIDER_SITE_OTHER): Payer: Medicare Other | Admitting: Family Medicine

## 2022-08-08 VITALS — BP 112/70 | HR 99 | Temp 97.7°F | Ht 65.5 in | Wt 183.0 lb

## 2022-08-08 DIAGNOSIS — Z1231 Encounter for screening mammogram for malignant neoplasm of breast: Secondary | ICD-10-CM

## 2022-08-08 DIAGNOSIS — E038 Other specified hypothyroidism: Secondary | ICD-10-CM

## 2022-08-08 DIAGNOSIS — M19032 Primary osteoarthritis, left wrist: Secondary | ICD-10-CM | POA: Diagnosis not present

## 2022-08-08 DIAGNOSIS — E7849 Other hyperlipidemia: Secondary | ICD-10-CM

## 2022-08-08 DIAGNOSIS — F411 Generalized anxiety disorder: Secondary | ICD-10-CM | POA: Diagnosis not present

## 2022-08-08 DIAGNOSIS — G8929 Other chronic pain: Secondary | ICD-10-CM

## 2022-08-08 DIAGNOSIS — G4700 Insomnia, unspecified: Secondary | ICD-10-CM

## 2022-08-08 DIAGNOSIS — M545 Low back pain, unspecified: Secondary | ICD-10-CM | POA: Diagnosis not present

## 2022-08-08 MED ORDER — LEVOTHYROXINE SODIUM 25 MCG PO TABS: ORAL_TABLET | ORAL | 6 refills | Status: DC

## 2022-08-08 MED ORDER — NORTRIPTYLINE HCL 10 MG PO CAPS: 20.0000 mg | ORAL_CAPSULE | Freq: Every day | ORAL | 0 refills | Status: DC

## 2022-08-08 MED ORDER — OXYCODONE HCL 5 MG PO TABS: ORAL_TABLET | ORAL | 0 refills | Status: DC

## 2022-08-08 MED ORDER — PRAVASTATIN SODIUM 20 MG PO TABS: 20.0000 mg | ORAL_TABLET | Freq: Every day | ORAL | 1 refills | Status: DC

## 2022-08-08 MED ORDER — ALPRAZOLAM 0.5 MG PO TABS: ORAL_TABLET | ORAL | 5 refills | Status: DC

## 2022-08-08 MED ORDER — OXYCODONE HCL 5 MG PO TABS: 5.0000 mg | ORAL_TABLET | ORAL | 0 refills | Status: DC | PRN

## 2022-08-08 NOTE — Progress Notes (Signed)
Subjective:    Patient ID: Amanda Osborne, female    DOB: 10-26-1951, 71 y.o.   MRN: [id number]  HPI 2 month follow up hernia Doing well on pantoprazole and famotidine This patient was seen today for chronic pain  The medication list was reviewed and updated.  Location of Pain for which the patient has been treated with regarding narcotics: Moderate amount of pain in her neck with chronic aspects  Onset of this pain: chronic joint pain   -Compliance with medication: daily  - Number patient states they take daily: 6 qd  -when was the last dose patient took? today  The patient was advised the importance of maintaining medication and not using illegal substances with these.  Here for refills and follow up  The patient was educated that we can provide 3 monthly scripts for their medication, it is their responsibility to follow the instructions.  Side effects or complications from medications: no  Patient is aware that pain medications are meant to minimize the severity of the pain to allow their pain levels to improve to allow for better function. They are aware of that pain medications cannot totally remove their pain.  Due for UDT ( at least once per year) : 11/04/2021  Scale of 1 to 10 ( 1 is least 10 is most) Your pain level without the medicine: helps with daily activity Your pain level with medication   Scale 1 to 10 ( 1-helps very little, 10 helps very well) How well does your pain medication reduce your pain so you can function better through out the day? Medication helps throughout the day to perfoerm daily activity  Quality of the pain: ache  Persistence of the pain: ongoing  Modifying factors:   She has chronic neck pain and discomfort chronic mid back pain and discomfort pain medicine is necessary to keep her pain manageable She also has anxiety related issues and takes Xanax and we have been on it for years and she is dependent on it he states she cannot  come away from this medication.  I did talk with her openly about how many of the current physician is coming out and practice will not prescribe both of these medicines together and it is important for her to try to taper down off of the use of these medicines if possible     Review of Systems     Objective:   Physical Exam General-in no acute distress Eyes-no discharge Lungs-respiratory rate normal, CTA CV-no murmurs,RRR Extremities skin warm dry no edema Neuro grossly normal Behavior normal, alert        Assessment & Plan:  Patient refuses pneumonia vaccine Refuses flu vaccine  1. Other specified hypothyroidism Continue thyroid medicine refill given check lab work - Lipid panel - TSH  2. Chronic bilateral low back pain, unspecified whether sciatica present Healthy diet Gentle stretching exercises The patient was seen in followup for chronic pain. A review over at their current pain status was discussed. Drug registry was checked. Prescriptions were given.  Regular follow-up recommended. Discussion was held regarding the importance of compliance with medication as well as pain medication contract.  Patient was informed that medication may cause drowsiness and should not be combined  with other medications/alcohol or street drugs. If the patient feels medication is causing altered alertness then do not drive or operate dangerous equipment.  Should be noted that the patient appears to be meeting appropriate use of opioids and response.  Evidenced by improved  function and decent pain control without significant side effects and no evidence of overt aberrancy issues.  Upon discussion with the patient today they understand that opioid therapy is optional and they feel that the pain has been refractory to reasonable conservative measures and is significant and affecting quality of life enough to warrant ongoing therapy and wishes to continue opioids.  Refills were  provided.  Patient has been on low-dose Xanax and oxycodone for years.  She is tolerated the 2 together.  She has severe back pain as well as anxiety issues.  I have talked with her about trying to taper 1 or the other more.  Currently right now she is very resistant to tapering either but we will discuss this further on follow-up.  She is aware that these medicines could increase her risk of accidental overdose especially when taken together.  She states she uses the oxycodone throughout the day and uses a Xanax tablet at evening time  3. Generalized anxiety disorder Xanax has been on chronically for years Nortriptyline in the evening time for headaches Serum total reuptake inhibitors are not tolerable with the nortriptyline  4. Insomnia, unspecified type Xanax at nighttime.  I have encouraged patient to taper it down to a half a tablet at night to see if that might help lessen the amount of medicine she is on  5. Arthritis of left wrist Significant pain discomfort in her left wrist  6. Other hyperlipidemia Hyperlipidemia check lipid profile continue pravastatin - Lipid panel - TSH  7. Encounter for screening mammogram for malignant neoplasm of breast Recommended - MM DIGITAL SCREENING BILATERAL Follow-up in 3 months

## 2022-08-11 ENCOUNTER — Other Ambulatory Visit: Payer: Self-pay | Admitting: Family Medicine

## 2022-08-18 ENCOUNTER — Other Ambulatory Visit: Payer: Self-pay | Admitting: Family Medicine

## 2022-08-31 ENCOUNTER — Ambulatory Visit (INDEPENDENT_AMBULATORY_CARE_PROVIDER_SITE_OTHER): Payer: Medicare Other

## 2022-08-31 VITALS — Ht 65.0 in | Wt 199.0 lb

## 2022-08-31 DIAGNOSIS — Z0000 Encounter for general adult medical examination without abnormal findings: Secondary | ICD-10-CM

## 2022-08-31 NOTE — Progress Notes (Signed)
Virtual Visit via Telephone Note  I connected with  Amanda Osborne on 08/31/22 at  2:30 PM EDT by telephone and verified that I am speaking with the correct person using two identifiers.  Location: Patient: home Provider: [facility] Persons participating in the virtual visit: patient/Nurse Health Advisor   I discussed the limitations, risks, security and privacy concerns of performing an evaluation and management service by telephone and the availability of in person appointments. The patient expressed understanding and agreed to proceed.  Interactive audio and video telecommunications were attempted between this nurse and patient, however failed, due to patient having technical difficulties OR patient did not have access to video capability.  We continued and completed visit with audio only.  Some vital signs may be absent or patient reported.   Dionisio David, LPN  Subjective:   Amanda Osborne is a 71 y.o. female who presents for Medicare Annual (Subsequent) preventive examination.  Review of Systems     Cardiac Risk Factors include: advanced age (>85mn, >>87women);hypertension;dyslipidemia     Objective:    There were no vitals filed for this visit. There is no height or weight on file to calculate BMI.     08/31/2022    2:35 PM 08/14/2021    1:09 PM 06/14/2018   11:30 AM 06/11/2018    1:22 PM 10/22/2015   10:56 AM 08/28/2015    8:18 AM 12/01/2014   10:59 AM  Advanced Directives  Does Patient Have a Medical Advance Directive? No Yes Yes Yes No Yes Yes  Type of [place]porate [place] Living will Living will  Living will Living will  Does patient want to make changes to medical advance directive?   No - Patient declined No - Patient declined     [place]?      No - copy requested   Would patient like information on creating a medical advance directive? No - Patient declined    No - patient declined  information      Current Medications (verified) Outpatient Encounter Medications as of 08/31/2022  Medication Sig   acetaminophen (TYLENOL) 500 MG tablet Take 500 mg by mouth every 6 (six) hours as needed.   ALPRAZolam (XANAX) 0.5 MG tablet TAKE 1 TABLET BY MOUTH TWICE DAILY AS NEEDED FOR ANXIETY   famotidine (PEPCID) 20 MG tablet Take 1 tablet (20 mg total) by mouth 2 (two) times daily as needed for heartburn or indigestion.   fluticasone (FLONASE) 50 MCG/ACT nasal spray INSTILL 2 SPRAYS INTO EACH NOSTRIL ONCE DAILY   hydrochlorothiazide (HYDRODIURIL) 25 MG tablet Take 1 tablet (25 mg total) by mouth daily. TAKE 1 TABLET(25 MG) BY MOUTH DAILY   ibuprofen (ADVIL) 200 MG tablet Take 200 mg by mouth every 6 (six) hours as needed.   Iron-Vitamin C (VITRON-C PO) Take 65 mg by mouth 2 (two) times daily.   levothyroxine (SYNTHROID) 25 MCG tablet TAKE 8 TABLETS BY MOUTH EVERY SATURDAY   lisinopril (ZESTRIL) 2.5 MG tablet TAKE 1 TABLET(2.5 MG) BY MOUTH DAILY   loratadine (CLARITIN) 10 MG tablet Take 10 mg by mouth daily.   nortriptyline (PAMELOR) 10 MG capsule Take 2 capsules (20 mg total) by mouth at bedtime. Please call [phone] schedule appt to continue refills or may request from PCP.   oxyCODONE (OXY IR/ROXICODONE) 5 MG immediate release tablet Take 1 tablet (5 mg total) by mouth every 4 (four) hours as needed for severe  pain.   pantoprazole (PROTONIX) 40 MG tablet Take 1 tablet (40 mg total) by mouth daily.   polyethylene glycol powder (GLYCOLAX/MIRALAX) powder Take 17 g by mouth daily as needed (for constipation).   potassium chloride (KLOR-CON) 10 MEQ tablet TAKE 1 TABLET(10 MEQ) BY MOUTH DAILY   pravastatin (PRAVACHOL) 20 MG tablet Take 1 tablet (20 mg total) by mouth daily.   cephALEXin (KEFLEX) 500 MG capsule Take 1 capsule 4 times a day by oral route for 8 days. (Patient not taking: Reported on 08/31/2022)   [DISCONTINUED] colchicine 0.6 MG tablet    [DISCONTINUED] indomethacin  (INDOCIN) 50 MG capsule    [DISCONTINUED] oxyCODONE (OXY IR/ROXICODONE) 5 MG immediate release tablet Take 1 tablet po every 4 hrs prn severe pain   [DISCONTINUED] oxyCODONE (OXY IR/ROXICODONE) 5 MG immediate release tablet Take 1 tablet po every 4 hrs prn severe pain   No facility-administered encounter medications on file as of 08/31/2022.    Allergies (verified) Topamax [topiramate], Celexa [citalopram hydrobromide], Fosamax [alendronate sodium], Neurontin [gabapentin], and Carisoprodol   History: Past Medical History:  Diagnosis Date   Anxiety    Arthritis    Cervical disc herniation    c5 and c6   Cervicogenic headache 05/13/2019   Chronic pain    Complication of anesthesia    Fibromyalgia    GERD (gastroesophageal reflux disease)    Headache(784.0)    Hx: of migraines until age 48's   Hyperlipidemia    Hypertension    Iron deficiency anemia 11/30/2012   Left carpal tunnel syndrome 11/07/2018   Narcolepsy    Occipital neuralgia    PONV (postoperative nausea and vomiting)    DURING FIRST NECK SURGERY ; BUT REPORT IT MAY HAVE BEEN THE DILAUDID THAT CAUSED THE VOITING I   Unspecified vitamin D deficiency 03/26/2013   Wrist fracture, bilateral 2005 and 2010   right then left   Past Surgical History:  Procedure Laterality Date   ANTERIOR CERVICAL DECOMP/DISCECTOMY FUSION  2000   C6-C7 DR [facility]    ANTERIOR CERVICAL DECOMP/DISCECTOMY FUSION N/A 08/05/2013   Procedure: Cervical five-six Anterior cervical decompression/diskectomy/fusion/ Synthes plate removal;  Surgeon: Kristeen Miss, MD;  Location: [facility];  Service: Neurosurgery;  Laterality: N/A;   BONE GRAFT HIP ILIAC CREST  2006   to right wrist to correct fracture, DR. Mulberry ORTHO    COLONOSCOPY     Hx; of   DILATION AND CURETTAGE OF UTERUS     ESOPHAGEAL DILATION N/A 10/22/2015   Procedure: ESOPHAGEAL DILATION;  Surgeon: Rogene Houston, MD;  Location: [facility];  Service: Endoscopy;  Laterality: N/A;    ESOPHAGOGASTRODUODENOSCOPY N/A 08/28/2015   Procedure: ESOPHAGOGASTRODUODENOSCOPY (EGD);  Surgeon: Rogene Houston, MD;  Location: [facility];  Service: Endoscopy;  Laterality: N/A;  910am   ESOPHAGOGASTRODUODENOSCOPY N/A 10/22/2015   Procedure: ESOPHAGOGASTRODUODENOSCOPY (EGD);  Surgeon: Rogene Houston, MD;  Location: [facility];  Service: Endoscopy;  Laterality: N/A;  12:00   FRACTURE SURGERY     Hx: of right wrist surgery   GASTRIC BYPASS  1981   INCISIONAL HERNIA REPAIR N/A 06/14/2018   Procedure: LAPAROSCOPIC INCISIONAL HERNIA REPAIR WITH INSERTION OF MESH ERAS PATHWAY;  Surgeon: Kinsinger, Arta Bruce, MD;  Location: [facility]S;  Service: General;  Laterality: N/A;   TONSILLECTOMY     Family History  Problem Relation Age of Onset   Stroke Mother    Heart disease Mother    Diabetes Mother    Heart disease Father  Cancer - Colon Father    Other Brother    Social History   Socioeconomic History   Marital status: Divorced    Spouse name: Not on file   Number of children: 1   Years of education: HS   Highest education level: Not on file  Occupational History   Occupation: retired  Tobacco Use   Smoking status: Never   Smokeless tobacco: Never  Vaping Use   Vaping Use: Never used  Substance and Sexual Activity   Alcohol use: No   Drug use: No   Sexual activity: Not Currently    Birth control/protection: Post-menopausal  Other Topics Concern   Not on file  Social History Narrative   Patient lives at home alone.   Caffeine Use: daily (BC podwder)   Social Determinants of Health   Financial Resource Strain: Low Risk  (08/31/2022)   Overall Financial Resource Strain (CARDIA)    Difficulty of Paying Living Expenses: Not hard at all  Food Insecurity: No Food Insecurity (08/31/2022)   Hunger Vital Sign    Worried About Running Out of Food in the Last Year: Never true    Ran Out of Food in the Last Year: Never true  Transportation Needs: No Transportation Needs  (08/31/2022)   PRAPARE - Hydrologist (Medical): No    Lack of Transportation (Non-Medical): No  Physical Activity: Unknown (08/31/2022)   Exercise Vital Sign    Days of Exercise per Week: Not on file    Minutes of Exercise per Session: 20 min  Stress: No Stress Concern Present (08/31/2022)   [place]    Feeling of Stress : Not at all  Social Connections: Socially Isolated (08/31/2022)   Social Connection and Isolation Panel [NHANES]    Frequency of Communication with Friends and Family: Never    Frequency of Social Gatherings with Friends and Family: Never    Attends Religious Services: Never    Marine scientist or Organizations: No    Attends Music therapist: Never    Marital Status: Divorced    Tobacco Counseling Counseling given: Not Answered   Clinical Intake:  Pre-visit preparation completed: Yes  Pain : No/denies pain     Nutritional Risks: None Diabetes: No  How often do you need to have someone help you when you read instructions, pamphlets, or other written materials from your doctor or pharmacy?: 1 - Never  Diabetic?no  Interpreter Needed?: No  Information entered by :: Kirke Shaggy, LPN   Activities of Daily Living    08/31/2022    2:36 PM  In your present state of health, do you have any difficulty performing the following activities:  Hearing? 0  Vision? 0  Difficulty concentrating or making decisions? 0  Walking or climbing stairs? 0  Dressing or bathing? 0  Doing errands, shopping? 0  Preparing Food and eating ? N  Using the Toilet? N  In the past six months, have you accidently leaked urine? N  Do you have problems with loss of bowel control? N  Managing your Medications? N  Managing your Finances? N  Housekeeping or managing your Housekeeping? N    Patient Care Team: Kathyrn Drown, MD as PCP - General (Family  Medicine)  Indicate any recent Medical Services you may have received from other than [facility] providers in the past year (date may be approximate).     Assessment:   This is  a routine wellness examination for Amanda Osborne.  Hearing/Vision screen Hearing Screening - Comments:: No aids Vision Screening - Comments:: Wears glasses- [facility] [place]  Dietary issues and exercise activities discussed: Current Exercise Habits: Home exercise routine, Type of exercise: walking, Time (Minutes): 20, Frequency (Times/Week): 2, Weekly Exercise (Minutes/Week): 40, Intensity: Mild   Goals Addressed             This Visit's Progress    DIET - EAT MORE FRUITS AND VEGETABLES         Depression Screen    08/31/2022    2:34 PM 05/17/2022    4:11 PM 08/14/2021    1:10 PM 08/14/2021    1:03 PM 08/05/2021    1:20 PM 03/11/2021    9:36 AM 02/01/2021    1:22 PM  PHQ 2/9 Scores  PHQ - 2 Score 0 0 2 0 0 0 0  PHQ- 9 Score 0  4        Fall Risk    08/31/2022    2:36 PM 05/17/2022    4:10 PM 08/14/2021    1:09 PM 08/05/2021    1:19 PM 05/05/2021    1:16 PM  [place] in the past year? 0 0 0 0 0  Number falls in past yr: 0 0 0  0  Injury with Fall? 0 0 0  0  Risk for fall due to : No Fall Risks No Fall Risks Impaired vision;History of fall(s) No Fall Risks No Fall Risks  Follow up Falls prevention discussed;Falls evaluation completed Falls evaluation completed Falls evaluation completed Falls evaluation completed Falls evaluation completed    FALL RISK PREVENTION PERTAINING TO THE HOME:  Any stairs in or around the home? Yes  If so, are there any without handrails? Yes  Home free of loose throw rugs in walkways, pet beds, electrical cords, etc? Yes  Adequate lighting in your home to reduce risk of falls? Yes   ASSISTIVE DEVICES UTILIZED TO PREVENT FALLS:  Life alert? No  Use of a cane, walker or w/c? No  Grab bars in the bathroom? No  Shower chair or bench in shower? No  Elevated toilet  seat or a handicapped toilet? No    Cognitive Function:declined PT is A & O x 3        08/14/2021    1:14 PM  6CIT Screen  What Year? 0 points  What month? 0 points  What time? 0 points  Count back from 20 0 points  Months in reverse 0 points  Repeat phrase 0 points  Total Score 0 points    Immunizations Immunization History  Administered Date(s) Administered   Moderna Sars-Covid-2 Vaccination 02/14/2020, 03/18/2020, 12/14/2020   Pneumococcal Conjugate-13 12/30/2016    TDAP status: Due, Education has been provided regarding the importance of this vaccine. Advised may receive this vaccine at local pharmacy or Health Dept. Aware to provide a copy of the vaccination record if obtained from local pharmacy or Health Dept. Verbalized acceptance and understanding.  Flu Vaccine status: Declined, Education has been provided regarding the importance of this vaccine but patient still declined. Advised may receive this vaccine at local pharmacy or Health Dept. Aware to provide a copy of the vaccination record if obtained from local pharmacy or Health Dept. Verbalized acceptance and understanding.  Pneumococcal vaccine status: Due, Education has been provided regarding the importance of this vaccine. Advised may receive this vaccine at local pharmacy or Health Dept. Aware to provide  a copy of the vaccination record if obtained from local pharmacy or Health Dept. Verbalized acceptance and understanding.  Covid-19 vaccine status: Completed vaccines  Qualifies for Shingles Vaccine? Yes   Zostavax completed No   Shingrix Completed?: No.    Education has been provided regarding the importance of this vaccine. Patient has been advised to call insurance company to determine out of pocket expense if they have not yet received this vaccine. Advised may also receive vaccine at local pharmacy or Health Dept. Verbalized acceptance and understanding.  Screening Tests Health Maintenance  Topic Date Due    TETANUS/TDAP  Never done   Zoster Vaccines- Shingrix (1 of 2) Never done   COLONOSCOPY (Pts 45-57yr Insurance coverage will need to be confirmed)  11/04/2022 (Originally 12/22/2012)   Pneumonia Vaccine 72 Years old (2 - PPSV23 or PCV20) 08/09/2023 (Originally 12/30/2017)   MAMMOGRAM  02/12/2023   DEXA SCAN  Completed   Hepatitis C Screening  Completed   HPV VACCINES  Aged Out   INFLUENZA VACCINE  Discontinued   COVID-19 Vaccine  Discontinued    Health Maintenance  Health Maintenance Due  Topic Date Due   TETANUS/TDAP  Never done   Zoster Vaccines- Shingrix (1 of 2) Never done    Colorectal cancer screening: Type of screening: FOBT/FIT. Completed 09/20/19. Repeat every 1 years- declined referral for colonoscopy  Mammogram status: Completed ordered on 08/08/22. Repeat every year  Bone Density status: Completed 02/19/21. Results reflect: Bone density results: OSTEOPOROSIS. Repeat every 2 years.  Lung Cancer Screening: (Low Dose CT Chest recommended if Age 72-80years, 30 pack-year currently smoking OR have quit w/in 15years.) does not qualify.   Additional Screening:  Hepatitis C Screening: does qualify; Completed 09/10/15  Vision Screening: Recommended annual ophthalmology exams for early detection of glaucoma and other disorders of the eye. Is the patient up to date with their annual eye exam?  Yes  Who is the provider or what is the name of the office in which the patient attends annual eye exams[place] [facility] [place] not established with a provider, would they like to be referred to a provider to establish care? No .   Dental Screening: Recommended annual dental exams for proper oral hygiene  Community Resource Referral / Chronic Care Management: CRR required this visit?  No   CCM required this visit?  No      Plan:     I have personally reviewed and noted the following in the patient's chart:   Medical and social history Use of alcohol, tobacco or illicit  drugs  Current medications and supplements including opioid prescriptions. Patient is currently taking opioid prescriptions. Information provided to patient regarding non-opioid alternatives. Patient advised to discuss non-opioid treatment plan with their provider. Functional ability and status Nutritional status Physical activity Advanced directives List of other physicians Hospitalizations, surgeries, and ER visits in previous 12 months Vitals Screenings to include cognitive, depression, and falls Referrals and appointments  In addition, I have reviewed and discussed with patient certain preventive protocols, quality metrics, and best practice recommendations. A written personalized care plan for preventive services as well as general preventive health recommendations were provided to patient.     LDionisio David LPN   [id number]  Nurse Notes: none

## 2022-08-31 NOTE — Patient Instructions (Signed)
Amanda Osborne , Thank you for taking time to come for your Medicare Wellness Visit. I appreciate your ongoing commitment to your health goals. Please review the following plan we discussed and let me know if I can assist you in the future.   Screening recommendations/referrals: Colonoscopy: FOBT 09/20/19, declined referral for colonoscopy Mammogram: 02/11/21, ordered on 08/08/22 Bone Density: 02/19/21, every 2 years Recommended yearly ophthalmology/optometry visit for glaucoma screening and checkup Recommended yearly dental visit for hygiene and checkup  Vaccinations: Influenza vaccine: n/d Pneumococcal vaccine: 12/30/16 Tdap vaccine: n/d Shingles vaccine: n/d   Covid-19:02/14/20, 03/18/20, 12/14/20  Advanced directives: no  Conditions/risks identified: none  Next appointment: Follow up in one year for your annual wellness visit - declined   Preventive Care 72 Years and Older, Female Preventive care refers to lifestyle choices and visits with your health care provider that can promote health and wellness. What does preventive care include? A yearly physical exam. This is also called an annual well check. Dental exams once or twice a year. Routine eye exams. Ask your health care provider how often you should have your eyes checked. Personal lifestyle choices, including: Daily care of your teeth and gums. Regular physical activity. Eating a healthy diet. Avoiding tobacco and drug use. Limiting alcohol use. Practicing safe sex. Taking low-dose aspirin every day. Taking vitamin and mineral supplements as recommended by your health care provider. What happens during an annual well check? The services and screenings done by your health care provider during your annual well check will depend on your age, overall health, lifestyle risk factors, and family history of disease. Counseling  Your health care provider may ask you questions about your: Alcohol use. Tobacco use. Drug  use. Emotional well-being. Home and relationship well-being. Sexual activity. Eating habits. History of falls. Memory and ability to understand (cognition). Work and work Statistician. Reproductive health. Screening  You may have the following tests or measurements: Height, weight, and BMI. Blood pressure. Lipid and cholesterol levels. These may be checked every 5 years, or more frequently if you are over 34 years old. Skin check. Lung cancer screening. You may have this screening every year starting at age 24 if you have a 30-pack-year history of smoking and currently smoke or have quit within the past 15 years. Fecal occult blood test (FOBT) of the stool. You may have this test every year starting at age 55. Flexible sigmoidoscopy or colonoscopy. You may have a sigmoidoscopy every 5 years or a colonoscopy every 10 years starting at age 70. Hepatitis C blood test. Hepatitis B blood test. Sexually transmitted disease (STD) testing. Diabetes screening. This is done by checking your blood sugar (glucose) after you have not eaten for a while (fasting). You may have this done every 1-3 years. Bone density scan. This is done to screen for osteoporosis. You may have this done starting at age 6. Mammogram. This may be done every 1-2 years. Talk to your health care provider about how often you should have regular mammograms. Talk with your health care provider about your test results, treatment options, and if necessary, the need for more tests. Vaccines  Your health care provider may recommend certain vaccines, such as: Influenza vaccine. This is recommended every year. Tetanus, diphtheria, and acellular pertussis (Tdap, Td) vaccine. You may need a Td booster every 10 years. Zoster vaccine. You may need this after age 49. Pneumococcal 13-valent conjugate (PCV13) vaccine. One dose is recommended after age 4. Pneumococcal polysaccharide (PPSV23) vaccine. One dose is recommended after  age  47. Talk to your health care provider about which screenings and vaccines you need and how often you need them. This information is not intended to replace advice given to you by your health care provider. Make sure you discuss any questions you have with your health care provider. Document Released: 12/04/2015 Document Revised: 07/27/2016 Document Reviewed: 09/08/2015 Elsevier Interactive Patient Education  2017 [employer] Prevention in the Home Falls can cause injuries. They can happen to people of all ages. There are many things you can do to make your home safe and to help prevent falls. What can I do on the outside of my home? Regularly fix the edges of walkways and driveways and fix any cracks. Remove anything that might make you trip as you walk through a door, such as a raised step or threshold. Trim any bushes or trees on the path to your home. Use bright outdoor lighting. Clear any walking paths of anything that might make someone trip, such as rocks or tools. Regularly check to see if handrails are loose or broken. Make sure that both sides of any steps have handrails. Any raised decks and porches should have guardrails on the edges. Have any leaves, snow, or ice cleared regularly. Use sand or salt on walking paths during winter. Clean up any spills in your garage right away. This includes oil or grease spills. What can I do in the bathroom? Use night lights. Install grab bars by the toilet and in the tub and shower. Do not use towel bars as grab bars. Use non-skid mats or decals in the tub or shower. If you need to sit down in the shower, use a plastic, non-slip stool. Keep the floor dry. Clean up any water that spills on the floor as soon as it happens. Remove soap buildup in the tub or shower regularly. Attach bath mats securely with double-sided non-slip rug tape. Do not have throw rugs and other things on the floor that can make you trip. What can I do in the  bedroom? Use night lights. Make sure that you have a light by your bed that is easy to reach. Do not use any sheets or blankets that are too big for your bed. They should not hang down onto the floor. Have a firm chair that has side arms. You can use this for support while you get dressed. Do not have throw rugs and other things on the floor that can make you trip. What can I do in the kitchen? Clean up any spills right away. Avoid walking on wet floors. Keep items that you use a lot in easy-to-reach places. If you need to reach something above you, use a strong step stool that has a grab bar. Keep electrical cords out of the way. Do not use floor polish or wax that makes floors slippery. If you must use wax, use non-skid floor wax. Do not have throw rugs and other things on the floor that can make you trip. What can I do with my stairs? Do not leave any items on the stairs. Make sure that there are handrails on both sides of the stairs and use them. Fix handrails that are broken or loose. Make sure that handrails are as long as the stairways. Check any carpeting to make sure that it is firmly attached to the stairs. Fix any carpet that is loose or worn. Avoid having throw rugs at the top or bottom of the stairs. If you do  have throw rugs, attach them to the floor with carpet tape. Make sure that you have a light switch at the top of the stairs and the bottom of the stairs. If you do not have them, ask someone to add them for you. What else can I do to help prevent falls? Wear shoes that: Do not have high heels. Have rubber bottoms. Are comfortable and fit you well. Are closed at the toe. Do not wear sandals. If you use a stepladder: Make sure that it is fully opened. Do not climb a closed stepladder. Make sure that both sides of the stepladder are locked into place. Ask someone to hold it for you, if possible. Clearly mark and make sure that you can see: Any grab bars or  handrails. First and last steps. Where the edge of each step is. Use tools that help you move around (mobility aids) if they are needed. These include: Canes. Walkers. Scooters. Crutches. Turn on the lights when you go into a dark area. Replace any light bulbs as soon as they burn out. Set up your furniture so you have a clear path. Avoid moving your furniture around. If any of your floors are uneven, fix them. If there are any pets around you, be aware of where they are. Review your medicines with your doctor. Some medicines can make you feel dizzy. This can increase your chance of falling. Ask your doctor what other things that you can do to help prevent falls. This information is not intended to replace advice given to you by your health care provider. Make sure you discuss any questions you have with your health care provider. Document Released: 09/03/2009 Document Revised: 04/14/2016 Document Reviewed: 12/12/2014 Elsevier Interactive Patient Education  2017 [employer].

## 2022-09-13 ENCOUNTER — Other Ambulatory Visit: Payer: Self-pay | Admitting: *Deleted

## 2022-09-13 MED ORDER — PANTOPRAZOLE SODIUM 40 MG PO TBEC: 40.0000 mg | DELAYED_RELEASE_TABLET | Freq: Every day | ORAL | 0 refills | Status: DC

## 2022-09-13 MED ORDER — PANTOPRAZOLE SODIUM 40 MG PO TBEC: 40.0000 mg | DELAYED_RELEASE_TABLET | Freq: Every day | ORAL | 1 refills | Status: DC

## 2022-10-02 ENCOUNTER — Encounter (INDEPENDENT_AMBULATORY_CARE_PROVIDER_SITE_OTHER): Payer: Self-pay | Admitting: Gastroenterology

## 2022-11-04 ENCOUNTER — Ambulatory Visit: Payer: Medicare Other | Admitting: Family Medicine

## 2022-11-07 ENCOUNTER — Ambulatory Visit (INDEPENDENT_AMBULATORY_CARE_PROVIDER_SITE_OTHER): Payer: Medicare Other | Admitting: Family Medicine

## 2022-11-07 VITALS — BP 110/70 | HR 82 | Temp 97.3°F | Ht 65.0 in | Wt 182.0 lb

## 2022-11-07 DIAGNOSIS — E038 Other specified hypothyroidism: Secondary | ICD-10-CM

## 2022-11-07 DIAGNOSIS — E7849 Other hyperlipidemia: Secondary | ICD-10-CM

## 2022-11-07 DIAGNOSIS — M2550 Pain in unspecified joint: Secondary | ICD-10-CM

## 2022-11-07 DIAGNOSIS — G8929 Other chronic pain: Secondary | ICD-10-CM

## 2022-11-07 DIAGNOSIS — Z79891 Long term (current) use of opiate analgesic: Secondary | ICD-10-CM | POA: Diagnosis not present

## 2022-11-07 DIAGNOSIS — I10 Essential (primary) hypertension: Secondary | ICD-10-CM

## 2022-11-07 DIAGNOSIS — M545 Low back pain, unspecified: Secondary | ICD-10-CM | POA: Diagnosis not present

## 2022-11-07 DIAGNOSIS — F411 Generalized anxiety disorder: Secondary | ICD-10-CM | POA: Diagnosis not present

## 2022-11-07 MED ORDER — OXYCODONE HCL 5 MG PO TABS: 5.0000 mg | ORAL_TABLET | ORAL | 0 refills | Status: DC | PRN

## 2022-11-07 MED ORDER — IBUPROFEN 600 MG PO TABS: 600.0000 mg | ORAL_TABLET | Freq: Three times a day (TID) | ORAL | 5 refills | Status: DC | PRN

## 2022-11-07 MED ORDER — PANTOPRAZOLE SODIUM 40 MG PO TBEC: 40.0000 mg | DELAYED_RELEASE_TABLET | Freq: Every day | ORAL | 1 refills | Status: DC

## 2022-11-07 MED ORDER — POTASSIUM CHLORIDE ER 10 MEQ PO TBCR: EXTENDED_RELEASE_TABLET | ORAL | 1 refills | Status: DC

## 2022-11-07 MED ORDER — HYDROCHLOROTHIAZIDE 25 MG PO TABS
25.0000 mg | ORAL_TABLET | Freq: Every day | ORAL | 1 refills | Status: DC
Start: 2022-11-07 — End: 2023-02-08

## 2022-11-07 MED ORDER — NORTRIPTYLINE HCL 10 MG PO CAPS: ORAL_CAPSULE | ORAL | 1 refills | Status: DC

## 2022-11-07 NOTE — Progress Notes (Signed)
Subjective:    Patient ID: Amanda Osborne, female    DOB: 1951-11-04, 71 y.o.   MRN: [id number]  HPI Pain Management follow up pain medictions This patient was seen today for chronic pain  The medication list was reviewed and updated.  Location of Pain for which the patient has been treated with regarding narcotics: Severe neck pain also knee pain and to some degree arthritis in her hands  Onset of this pain: Present for years good   -Compliance with medication: Good compliance  - Number patient states they take daily: 6/day, sometimes she takes 1 every 4 hours other times it is 2 tablets 3 times a day  -when was the last dose patient took?  Earlier today  The patient was advised the importance of maintaining medication and not using illegal substances with these.  Here for refills and follow up  The patient was educated that we can provide 3 monthly scripts for their medication, it is their responsibility to follow the instructions.  Side effects or complications from medications: Denies drowsiness denies any side effects  Patient is aware that pain medications are meant to minimize the severity of the pain to allow their pain levels to improve to allow for better function. They are aware of that pain medications cannot totally remove their pain.  Due for UDT ( at least once per year) : Due today  Scale of 1 to 10 ( 1 is least 10 is most) Your pain level without the medicine: 8 Your pain level with medication 4  Scale 1 to 10 ( 1-helps very little, 10 helps very well) How well does your pain medication reduce your pain so you can function better through out the day? 7  Quality of the pain: Throbbing aching  Persistence of the pain: Prior and all the time  Modifying factors: Worse with activity       Review of Systems     Objective:   Physical Exam General-in no acute distress Eyes-no discharge Lungs-respiratory rate normal, CTA CV-no  murmurs,RRR Extremities skin warm dry no edema Neuro grossly normal Behavior normal, alert        Assessment & Plan:  1. Arthralgia, unspecified joint Check C-reactive protein doubt arthritis I think this is more osteoarthritis rather than rheumatoid - Lipid Panel - Basic Metabolic Panel - TSH - C-reactive protein  2. Chronic bilateral low back pain, unspecified whether sciatica present Pain medication sent in has chronic pain in the low back as well as spinal area of the neck - Lipid Panel - Basic Metabolic Panel - TSH - C-reactive protein - ToxASSURE Select 13 (MW), Urine - oxyCODONE (OXY IR/ROXICODONE) 5 MG immediate release tablet; Take 1 tablet (5 mg total) by mouth every 4 (four) hours as needed for severe pain.  Dispense: 180 tablet; Refill: 0 - oxyCODONE (ROXICODONE) 5 MG immediate release tablet; Take 1 tablet (5 mg total) by mouth every 4 (four) hours as needed for severe pain.  Dispense: 180 tablet; Refill: 0 - oxyCODONE (ROXICODONE) 5 MG immediate release tablet; Take 1 tablet (5 mg total) by mouth every 4 (four) hours as needed for severe pain.  Dispense: 180 tablet; Refill: 0  3. GAD (generalized anxiety disorder) Anxiety continue the Xanax that the low-dose we have her on she has been on this for years she feels she cannot come off we have tried in the past without success - Lipid Panel - Basic Metabolic Panel - TSH - C-reactive protein - ToxASSURE Select  13 (MW), Urine  4. Other specified hypothyroidism Continue thyroid medicine check TSH - Lipid Panel - Basic Metabolic Panel - TSH - C-reactive protein  5. HTN (hypertension), benign Blood pressure good control continue current measures - Lipid Panel - Basic Metabolic Panel - TSH - C-reactive protein  6. Encounter for long-term opiate analgesic use Pain medication The patient was seen in followup for chronic pain. A review over at their current pain status was discussed. Drug registry was  checked. Prescriptions were given.  Regular follow-up recommended. Discussion was held regarding the importance of compliance with medication as well as pain medication contract.  Patient was informed that medication may cause drowsiness and should not be combined  with other medications/alcohol or street drugs. If the patient feels medication is causing altered alertness then do not drive or operate dangerous equipment.  Should be noted that the patient appears to be meeting appropriate use of opioids and response.  Evidenced by improved function and decent pain control without significant side effects and no evidence of overt aberrancy issues.  Upon discussion with the patient today they understand that opioid therapy is optional and they feel that the pain has been refractory to reasonable conservative measures and is significant and affecting quality of life enough to warrant ongoing therapy and wishes to continue opioids.  Refills were provided.  - oxyCODONE (OXY IR/ROXICODONE) 5 MG immediate release tablet; Take 1 tablet (5 mg total) by mouth every 4 (four) hours as needed for severe pain.  Dispense: 180 tablet; Refill: 0 - oxyCODONE (ROXICODONE) 5 MG immediate release tablet; Take 1 tablet (5 mg total) by mouth every 4 (four) hours as needed for severe pain.  Dispense: 180 tablet; Refill: 0 - oxyCODONE (ROXICODONE) 5 MG immediate release tablet; Take 1 tablet (5 mg total) by mouth every 4 (four) hours as needed for severe pain.  Dispense: 180 tablet; Refill: 0

## 2022-11-08 ENCOUNTER — Ambulatory Visit: Payer: Medicare Other | Admitting: Family Medicine

## 2022-11-08 LAB — BASIC METABOLIC PANEL
BUN/Creatinine Ratio: 28 (ref 12–28)
BUN: 17 mg/dL (ref 8–27)
CO2: 25 mmol/L (ref 20–29)
Calcium: 10 mg/dL (ref 8.7–10.3)
Chloride: 98 mmol/L (ref 96–106)
Creatinine, Ser: 0.61 mg/dL (ref 0.57–1.00)
Glucose: 97 mg/dL (ref 70–99)
Potassium: 5.1 mmol/L (ref 3.5–5.2)
Sodium: 138 mmol/L (ref 134–144)
eGFR: 96 mL/min/{1.73_m2} (ref >59)

## 2022-11-08 LAB — LIPID PANEL
Chol/HDL Ratio: 2.9 ratio (ref 0.0–4.4)
Cholesterol, Total: 186 mg/dL (ref 100–199)
HDL: 65 mg/dL (ref >39)
LDL Chol Calc (NIH): 103 mg/dL — ABNORMAL HIGH (ref 0–99)
Triglycerides: 102 mg/dL (ref 0–149)
VLDL Cholesterol Cal: 18 mg/dL (ref 5–40)

## 2022-11-08 LAB — TSH: TSH: 4.52 u[IU]/mL — ABNORMAL HIGH (ref 0.450–4.500)

## 2022-11-08 LAB — C-REACTIVE PROTEIN: CRP: 10 mg/L (ref 0–10)

## 2022-11-10 LAB — TOXASSURE SELECT 13 (MW), URINE

## 2022-11-24 ENCOUNTER — Telehealth: Payer: Self-pay | Admitting: *Deleted

## 2022-11-24 ENCOUNTER — Other Ambulatory Visit: Payer: Self-pay | Admitting: *Deleted

## 2022-11-24 ENCOUNTER — Other Ambulatory Visit: Payer: Self-pay | Admitting: Family Medicine

## 2022-11-24 MED ORDER — LEVOTHYROXINE SODIUM 25 MCG PO TABS: ORAL_TABLET | ORAL | 1 refills | Status: DC

## 2022-11-24 MED ORDER — OXYCODONE-ACETAMINOPHEN 10-325 MG PO TABS: ORAL_TABLET | ORAL | 0 refills | Status: DC

## 2022-11-24 NOTE — Telephone Encounter (Signed)
Patient called and stated she went to pick up he oxycodone 5/325 today from [facility] [address] and they told her it was unavailable but they have the 10/325. Patient would like new scripts for the 10/325 at 3 times a day instead of the 5/325 6 times a day- patient states she had been taking 2 at a time 3 times a day anyway and so it would not be a change for her.  Also see result not concerning thyroid medication

## 2022-11-24 NOTE — Telephone Encounter (Signed)
Please see result note regarding her thyroid As for her pain medicine I sent in oxycodone 10/325, 1 taken 3 times daily as needed for pain, #90, we will be important for the patient to give [place] feedback within the next 3 weeks how this is working out for her if it is working well we can send in the additional prescriptions she is to keep her follow-up visit in March thank you

## 2022-11-24 NOTE — Addendum Note (Signed)
Addended by: Dairl Ponder on: 11/24/2022 02:14 PM   Modules accepted: Orders

## 2022-11-24 NOTE — Telephone Encounter (Signed)
Patient notified and result note discussed with patient

## 2022-12-26 ENCOUNTER — Telehealth: Payer: Self-pay | Admitting: *Deleted

## 2022-12-26 ENCOUNTER — Other Ambulatory Visit: Payer: Self-pay | Admitting: Family Medicine

## 2022-12-26 ENCOUNTER — Encounter: Payer: Self-pay | Admitting: Family Medicine

## 2022-12-26 MED ORDER — OXYCODONE-ACETAMINOPHEN 10-325 MG PO TABS: ORAL_TABLET | ORAL | 0 refills | Status: DC

## 2022-12-26 NOTE — Telephone Encounter (Signed)
Prescription on pain medicine was sent in, 2 prescription sent in 1 can be filled now another 1 in 30 days keep follow-up visit March 20 as planned

## 2022-12-26 NOTE — Telephone Encounter (Signed)
Patient called and stated she is doing well on the new dose of her pain medication and needs a new script sent to [facility] on [address]

## 2022-12-26 NOTE — Telephone Encounter (Signed)
Patient notified

## 2022-12-29 MED ORDER — OXYCODONE-ACETAMINOPHEN 10-325 MG PO TABS: ORAL_TABLET | ORAL | 0 refills | Status: DC

## 2023-01-19 ENCOUNTER — Encounter: Payer: Self-pay | Admitting: Radiology

## 2023-02-08 ENCOUNTER — Encounter: Payer: Self-pay | Admitting: Family Medicine

## 2023-02-08 ENCOUNTER — Ambulatory Visit (INDEPENDENT_AMBULATORY_CARE_PROVIDER_SITE_OTHER): Payer: Medicare Other | Admitting: Family Medicine

## 2023-02-08 VITALS — BP 120/70 | Wt 186.0 lb

## 2023-02-08 DIAGNOSIS — F411 Generalized anxiety disorder: Secondary | ICD-10-CM

## 2023-02-08 DIAGNOSIS — M19031 Primary osteoarthritis, right wrist: Secondary | ICD-10-CM | POA: Diagnosis not present

## 2023-02-08 DIAGNOSIS — E7849 Other hyperlipidemia: Secondary | ICD-10-CM | POA: Diagnosis not present

## 2023-02-08 DIAGNOSIS — Z79899 Other long term (current) drug therapy: Secondary | ICD-10-CM

## 2023-02-08 DIAGNOSIS — Z79891 Long term (current) use of opiate analgesic: Secondary | ICD-10-CM

## 2023-02-08 DIAGNOSIS — M1250 Traumatic arthropathy, unspecified site: Secondary | ICD-10-CM

## 2023-02-08 DIAGNOSIS — I10 Essential (primary) hypertension: Secondary | ICD-10-CM | POA: Diagnosis not present

## 2023-02-08 DIAGNOSIS — E038 Other specified hypothyroidism: Secondary | ICD-10-CM

## 2023-02-08 DIAGNOSIS — M19032 Primary osteoarthritis, left wrist: Secondary | ICD-10-CM | POA: Diagnosis not present

## 2023-02-08 MED ORDER — PRAVASTATIN SODIUM 20 MG PO TABS: 20.0000 mg | ORAL_TABLET | Freq: Every day | ORAL | 1 refills | Status: DC

## 2023-02-08 MED ORDER — OXYCODONE-ACETAMINOPHEN 10-325 MG PO TABS: ORAL_TABLET | ORAL | 0 refills | Status: DC

## 2023-02-08 MED ORDER — LISINOPRIL 2.5 MG PO TABS: ORAL_TABLET | ORAL | 1 refills | Status: DC

## 2023-02-08 MED ORDER — LEVOTHYROXINE SODIUM 25 MCG PO TABS: ORAL_TABLET | ORAL | 1 refills | Status: DC

## 2023-02-08 MED ORDER — PANTOPRAZOLE SODIUM 40 MG PO TBEC: 40.0000 mg | DELAYED_RELEASE_TABLET | Freq: Every day | ORAL | 1 refills | Status: DC

## 2023-02-08 MED ORDER — NORTRIPTYLINE HCL 10 MG PO CAPS: ORAL_CAPSULE | ORAL | 1 refills | Status: DC

## 2023-02-08 MED ORDER — POTASSIUM CHLORIDE ER 10 MEQ PO TBCR: EXTENDED_RELEASE_TABLET | ORAL | 1 refills | Status: DC

## 2023-02-08 MED ORDER — HYDROCHLOROTHIAZIDE 25 MG PO TABS: 25.0000 mg | ORAL_TABLET | Freq: Every day | ORAL | 1 refills | Status: DC

## 2023-02-08 MED ORDER — ALPRAZOLAM 0.5 MG PO TABS: ORAL_TABLET | ORAL | 5 refills | Status: DC

## 2023-02-08 NOTE — Progress Notes (Signed)
Subjective:    Patient ID: Amanda Osborne, female    DOB: April 22, 1951, 72 y.o.   MRN: [id number]  HPI  This patient was seen today for chronic pain  The medication list was reviewed and updated.  Location of Pain for which the patient has been treated with regarding narcotics: joint pain  Onset of this pain: years   -Compliance with medication: years  - Number patient states they take daily: 3  -when was the last dose patient took? today  The patient was advised the importance of maintaining medication and not using illegal substances with these.  Here for refills and follow up  The patient was educated that we can provide 3 monthly scripts for their medication, it is their responsibility to follow the instructions.  Side effects or complications from medications: none  Patient is aware that pain medications are meant to minimize the severity of the pain to allow their pain levels to improve to allow for better function. They are aware of that pain medications cannot totally remove their pain.  Due for UDT ( at least once per year) : last done 10/2022   Traumatic arthritis  Other specified hypothyroidism  HTN (hypertension), benign  Primary osteoarthritis of both wrists  Encounter for long-term opiate analgesic use  GAD (generalized anxiety disorder) Patient relates a lot of left wrist pain and discomfort from a previous fracture She does take her thyroid medicine states it seems to be doing well States her blood pressure is doing fine Takes her medicine regular basis Does have longstanding generalized anxiety uses alprazolam but denies any problems with the medicine     Review of Systems     Objective:   Physical Exam General-in no acute distress Eyes-no discharge Lungs-respiratory rate normal, CTA CV-no murmurs,RRR Extremities skin warm dry no edema Neuro grossly normal Behavior normal, alert        Assessment & Plan:  1. Traumatic  arthritis Due to previous fall and fracture.  Left wrist.  Orthopedics is an option patient defers currently  2. Other specified hypothyroidism Continue thyroid medication Reason to do lab work today  3. HTN (hypertension), benign Blood pressure good control continue current measures  4. Primary osteoarthritis of both wrists Tylenol.  Already on pain medicine do not overdo Tylenol  5. Encounter for long-term opiate analgesic use The patient was seen in followup for chronic pain. A review over at their current pain status was discussed. Drug registry was checked. Prescriptions were given.  Regular follow-up recommended. Discussion was held regarding the importance of compliance with medication as well as pain medication contract.  Patient was informed that medication may cause drowsiness and should not be combined  with other medications/alcohol or street drugs. If the patient feels medication is causing altered alertness then do not drive or operate dangerous equipment.  Should be noted that the patient appears to be meeting appropriate use of opioids and response.  Evidenced by improved function and decent pain control without significant side effects and no evidence of overt aberrancy issues.  Upon discussion with the patient today they understand that opioid therapy is optional and they feel that the pain has been refractory to reasonable conservative measures and is significant and affecting quality of life enough to warrant ongoing therapy and wishes to continue opioids.  Refills were provided. We have talked at length about this Patient tends to tolerate the pain medicine well it does help her.  She has been on it for years.  Tylenol alone did not do enough Does not tolerate NSAIDs Therefore it is clinically indicated for She does yearly urine drug screens She also does pain management and every 3 months visit  6. GAD (generalized anxiety disorder) She has been on Xanax for years  we will taper down to the lowest dose.  She tolerates this well not causing any drowsiness.  Patient very fearful to come off of medicine  Lab work before next follow-up visit

## 2023-02-15 NOTE — Addendum Note (Signed)
Addended by: Dairl Ponder on: 02/15/2023 02:14 PM   Modules accepted: Orders

## 2023-05-11 ENCOUNTER — Ambulatory Visit (INDEPENDENT_AMBULATORY_CARE_PROVIDER_SITE_OTHER): Payer: Medicare Other | Admitting: Family Medicine

## 2023-05-11 ENCOUNTER — Encounter: Payer: Self-pay | Admitting: Family Medicine

## 2023-05-11 VITALS — BP 108/64 | HR 100 | Wt 191.2 lb

## 2023-05-11 DIAGNOSIS — R251 Tremor, unspecified: Secondary | ICD-10-CM | POA: Diagnosis not present

## 2023-05-11 DIAGNOSIS — F411 Generalized anxiety disorder: Secondary | ICD-10-CM | POA: Diagnosis not present

## 2023-05-11 DIAGNOSIS — Z5941 Food insecurity: Secondary | ICD-10-CM

## 2023-05-11 DIAGNOSIS — E038 Other specified hypothyroidism: Secondary | ICD-10-CM | POA: Diagnosis not present

## 2023-05-11 DIAGNOSIS — M79642 Pain in left hand: Secondary | ICD-10-CM | POA: Diagnosis not present

## 2023-05-11 DIAGNOSIS — M4712 Other spondylosis with myelopathy, cervical region: Secondary | ICD-10-CM | POA: Diagnosis not present

## 2023-05-11 DIAGNOSIS — I10 Essential (primary) hypertension: Secondary | ICD-10-CM | POA: Diagnosis not present

## 2023-05-11 DIAGNOSIS — Z79891 Long term (current) use of opiate analgesic: Secondary | ICD-10-CM | POA: Diagnosis not present

## 2023-05-11 DIAGNOSIS — E7849 Other hyperlipidemia: Secondary | ICD-10-CM | POA: Diagnosis not present

## 2023-05-11 DIAGNOSIS — M1250 Traumatic arthropathy, unspecified site: Secondary | ICD-10-CM | POA: Diagnosis not present

## 2023-05-11 MED ORDER — OXYCODONE-ACETAMINOPHEN 10-325 MG PO TABS: ORAL_TABLET | ORAL | 0 refills | Status: DC

## 2023-05-11 MED ORDER — HYDROCHLOROTHIAZIDE 12.5 MG PO TABS: ORAL_TABLET | ORAL | 1 refills | Status: DC

## 2023-05-11 MED ORDER — ALPRAZOLAM 0.5 MG PO TABS: ORAL_TABLET | ORAL | 5 refills | Status: DC

## 2023-05-11 NOTE — Progress Notes (Addendum)
Subjective:    Patient ID: Amanda Osborne, female    DOB: 03/18/1951, 72 y.o.   MRN: [id number]  HPI This patient was seen today for chronic pain  The medication list was reviewed and updated.  's Location of Pain for which the patient has been treated with regarding narcotics: Has ongoing neck pain and low back pain she has had previous MRIs she has had previous surgery in her neck she has tried NSAIDs Tylenol injections surgery physical therapy none of this has helped because of the chronic pain she has been on opioids for several years.  She tolerates them well.  She states it does allow her to function better.  Onset of this pain: Present for years   -Compliance with medication: Good compliance  - Number patient states they take daily: Typically 3 daily  -Reason for ongoing use of opioids please see discussion above  What other measures have been tried outside of opioids please see discussion above  In the ongoing specialists regarding this condition she has seen back specialist in the past  -when was the last dose patient took? 12:20pm today   The patient was advised the importance of maintaining medication and not using illegal substances with these.  Here for refills and follow up  The patient was educated that we can provide 3 monthly scripts for their medication, it is their responsibility to follow the instructions.  Side effects or complications from medications: Denies side effects  Patient is aware that pain medications are meant to minimize the severity of the pain to allow their pain levels to improve to allow for better function. They are aware of that pain medications cannot totally remove their pain.  Due for UDT ( at least once per year) (pain management contract is also completed at the time of the UDT): 11/07/2022  Scale of 1 to 10 ( 1 is least 10 is most) Your pain level without the medicine: 8 Your pain level with medication 5  Scale 1 to 10 (  1-helps very little, 10 helps very well) How well does your pain medication reduce your pain so you can function better through out the day? 8  Quality of the pain: Throbbing aching  Persistence of the pain: Present all the time  Modifying factors: Worse with activity  financial she is under a lot of stress with financial strain not able to afford her rent as well as food.  She has had to make difficult choices because of this and does not eat healthy    Left hand-left hand is having some tremors weakness some intermittent numbness tingling.  She states a lot of this is a result of severe arthritis in the left wrist  .phm  Outpatient Encounter Medications as of 05/11/2023  Medication Sig Note   acetaminophen (TYLENOL) 500 MG tablet Take 500 mg by mouth every 6 (six) hours as needed.    famotidine (PEPCID) 20 MG tablet Take 1 tablet (20 mg total) by mouth 2 (two) times daily as needed for heartburn or indigestion. 08/08/2022: prn   fluticasone (FLONASE) 50 MCG/ACT nasal spray INSTILL 2 SPRAYS INTO EACH NOSTRIL ONCE DAILY    ibuprofen (ADVIL) 600 MG tablet Take 1 tablet (600 mg total) by mouth every 8 (eight) hours as needed.    Iron-Vitamin C (VITRON-C PO) Take 65 mg by mouth 2 (two) times daily.    levothyroxine (SYNTHROID) 25 MCG tablet TAKE 4 TABLETS BY MOUTH EVERY SATURDAY AND 5 TABLETS EVERY WEDNESDAY  lisinopril (ZESTRIL) 2.5 MG tablet 1 qd    loratadine (CLARITIN) 10 MG tablet Take 10 mg by mouth daily. 09/27/2021: Seasonally   nortriptyline (PAMELOR) 10 MG capsule 1 to 2 qhs prn as directed    pantoprazole (PROTONIX) 40 MG tablet Take 1 tablet (40 mg total) by mouth daily.    polyethylene glycol powder (GLYCOLAX/MIRALAX) powder Take 17 g by mouth daily as needed (for constipation). 08/08/2022: prn   potassium chloride (KLOR-CON) 10 MEQ tablet TAKE 1 TABLET(10 MEQ) BY MOUTH DAILY    pravastatin (PRAVACHOL) 20 MG tablet Take 1 tablet (20 mg total) by mouth daily.    [DISCONTINUED]  ALPRAZolam (XANAX) 0.5 MG tablet TAKE 1 TABLET BY MOUTH TWICE DAILY AS NEEDED FOR ANXIETY    [DISCONTINUED] hydrochlorothiazide (HYDRODIURIL) 25 MG tablet Take 1 tablet (25 mg total) by mouth daily. TAKE 1 TABLET(25 MG) BY MOUTH DAILY    [DISCONTINUED] oxyCODONE-acetaminophen (PERCOCET) 10-325 MG tablet 1 taken 3 times daily as needed for pain    [DISCONTINUED] oxyCODONE-acetaminophen (PERCOCET) 10-325 MG tablet 1 taken 3 times daily as needed for pain caution drowsiness    [DISCONTINUED] oxyCODONE-acetaminophen (PERCOCET) 10-325 MG tablet One tid prn pain    ALPRAZolam (XANAX) 0.5 MG tablet TAKE 1 TABLET BY MOUTH TWICE DAILY AS NEEDED FOR ANXIETY    hydrochlorothiazide (HYDRODIURIL) 12.5 MG tablet TAKE 1 TABLET(25 MG) BY MOUTH DAILY    oxyCODONE-acetaminophen (PERCOCET) 10-325 MG tablet 1 taken 3 times daily as needed for pain caution drowsiness    oxyCODONE-acetaminophen (PERCOCET) 10-325 MG tablet One tid prn pain    oxyCODONE-acetaminophen (PERCOCET) 10-325 MG tablet 1 taken 3 times daily as needed for pain    No facility-administered encounter medications on file as of 05/11/2023.    Tremor  Left hand pain  HTN (hypertension), benign  GAD (generalized anxiety disorder)  Spondylosis, cervical, with myelopathy C5-6  Encounter for long-term opiate analgesic use   Review of Systems     Objective:   Physical Exam  General-in no acute distress Eyes-no discharge Lungs-respiratory rate normal, CTA CV-no murmurs,RRR Extremities skin warm dry no edema Neuro grossly normal Behavior normal, alert  No sign of Parkinson Strength in the left hand reasonably good left arm is good reflexes normal     Assessment & Plan:  1. Tremor Some tremor noted within the left hand not severe no cogwheeling.  Will monitor  2. Left hand pain Patient has significant osteoarthritis of the left wrist.  Patient unfortunately cannot afford additional medical cost currently.  She is on pain  medication.  States that it does a reasonable job helping her.  3. HTN (hypertension), benign Blood pressure reduce HCTZ.  Continue other medicines as is.  Await her lab work.  4. GAD (generalized anxiety disorder) Longstanding stress and anxiety.  Alprazolam 0.5 mg twice daily as needed been on this a long span of time safely tolerates this with her oxycodone  5. Spondylosis, cervical, with myelopathy C5-6 Gentle stretches  6. Encounter for long-term opiate analgesic use The patient was seen in followup for chronic pain. A review over at their current pain status was discussed. Drug registry was checked. Prescriptions were given.  Regular follow-up recommended. Discussion was held regarding the importance of compliance with medication as well as pain medication contract.  Patient was informed that medication may cause drowsiness and should not be combined  with other medications/alcohol or street drugs. If the patient feels medication is causing altered alertness then do not drive or operate dangerous equipment.  Should be noted that the patient appears to be meeting appropriate use of opioids and response.  Evidenced by improved function and decent pain control without significant side effects and no evidence of overt aberrancy issues.  Upon discussion with the patient today they understand that opioid therapy is optional and they feel that the pain has been refractory to reasonable conservative measures and is significant and affecting quality of life enough to warrant ongoing therapy and wishes to continue opioids.  Refills were provided.  Does yearly urine drug screen, pain medicine contract, does well with oxycodone does not abuse the medicine  Follow-up in 3 months  Patient does request that social worker connects with her to see if there is any assistance that can be made to help her work with affording food

## 2023-05-12 LAB — LIPID PANEL
Chol/HDL Ratio: 3.5 ratio (ref 0.0–4.4)
Cholesterol, Total: 197 mg/dL (ref 100–199)
HDL: 56 mg/dL (ref >39)
LDL Chol Calc (NIH): 117 mg/dL — ABNORMAL HIGH (ref 0–99)
Triglycerides: 135 mg/dL (ref 0–149)
VLDL Cholesterol Cal: 24 mg/dL (ref 5–40)

## 2023-05-12 LAB — BASIC METABOLIC PANEL
BUN/Creatinine Ratio: 43 — ABNORMAL HIGH (ref 12–28)
BUN: 27 mg/dL (ref 8–27)
CO2: 24 mmol/L (ref 20–29)
Calcium: 10.2 mg/dL (ref 8.7–10.3)
Chloride: 97 mmol/L (ref 96–106)
Creatinine, Ser: 0.63 mg/dL (ref 0.57–1.00)
Glucose: 116 mg/dL — ABNORMAL HIGH (ref 70–99)
Potassium: 4.2 mmol/L (ref 3.5–5.2)
Sodium: 137 mmol/L (ref 134–144)
eGFR: 94 mL/min/{1.73_m2} (ref >59)

## 2023-05-12 LAB — HEPATIC FUNCTION PANEL
ALT: 14 IU/L (ref 0–32)
AST: 16 IU/L (ref 0–40)
Albumin: 4.3 g/dL (ref 3.8–4.8)
Alkaline Phosphatase: 78 IU/L (ref 44–121)
Bilirubin Total: 0.3 mg/dL (ref 0.0–1.2)
Bilirubin, Direct: <0.1 mg/dL (ref 0.00–0.40)
Total Protein: 6.5 g/dL (ref 6.0–8.5)

## 2023-05-12 LAB — MICROALBUMIN, URINE: Microalbumin, Urine: 58.3 ug/mL

## 2023-05-12 LAB — TSH: TSH: 3.48 u[IU]/mL (ref 0.450–4.500)

## 2023-05-17 NOTE — Addendum Note (Signed)
Addended by: Margaretha Sheffield on: 05/17/2023 11:53 AM   Modules accepted: Orders

## 2023-05-18 ENCOUNTER — Telehealth: Payer: Self-pay | Admitting: *Deleted

## 2023-05-18 NOTE — Progress Notes (Signed)
  Care Coordination   Note   05/18/2023 Name: ABIGIAL NEWVILLE MRN: [id number] DOB: March 16, 1951  Alvera Singh Myer is a 72 y.o. year old female who sees Luking, Jonna Coup, MD for primary care. I reached out to [name] by phone today to offer care coordination services.  Ms. Morreale was given information about Care Coordination services today including:   The Care Coordination services include support from the care team which includes your Nurse Coordinator, Clinical Social Worker, or Pharmacist.  The Care Coordination team is here to help remove barriers to the health concerns and goals most important to you. Care Coordination services are voluntary, and the patient may decline or stop services at any time by request to their care team member.   Care Coordination Consent Status: Patient agreed to services and verbal consent obtained.   Follow up plan:  Telephone appointment with care coordination team member scheduled for:  05/19/23  Encounter Outcome:  Pt. Scheduled  [facility] Coordination Care Guide  Direct Dial: [phone]

## 2023-05-19 ENCOUNTER — Ambulatory Visit: Payer: Self-pay

## 2023-05-19 NOTE — Patient Instructions (Signed)
Visit Information  Thank you for taking time to visit with me today. Please don't hesitate to contact me if I can be of assistance to you.   Following are the goals we discussed today:  - Engage with Food Pantry resources when they contact you   Our next appointment is by telephone on 7/10 at 3:00  Please call the care guide team at [phone] if you need to cancel or reschedule your appointment.   If you are experiencing a Mental Health or Behavioral Health Crisis or need someone to talk to, please call the [facility]: [phone]  Patient verbalizes understanding of instructions and care plan provided today and agrees to view in MyChart. Active MyChart status and patient understanding of how to access instructions and care plan via MyChart confirmed with patient.     Bevelyn Ngo, BSW, CDP Social Worker, Certified Dementia Practitioner [facility] Care Management  Care Coordination [phone]

## 2023-05-19 NOTE — Patient Outreach (Signed)
  Care Coordination   Initial Visit Note   05/19/2023 Name: Amanda Osborne MRN: [id number] DOB: September 21, 1951  Amanda Osborne is a 72 y.o. year old female who sees Luking, Jonna Coup, MD for primary care. I spoke with  [name] by phone today.  What matters to the patients health and wellness today?  The patient is concerned with her blood glucose level being elevated due to diet changes    Goals Addressed             This Visit's Progress    Care Coordination Activities       Care Coordination Interventions: Discussed the patient is having difficulty managing the cost of living due to increase in prices at the grocery store. Patient reports she is not going without food but is having to adjust what she purchases and is eating a lot more sandwiches. Recently her blood glucose has increased and she is concerned her inability to purchase healthier food options is impacting her health Determined the patient is not eligible for Medicaid. She has applied for [employer] benefits in the past and was told she qualified for $16 per month. The patient did not pursue this benefit as she felt it was not worth the time to apply Discussed the patient lives alone and is independent with ALD and iADL needs. She still currently drives. Her son lives in [place] but has plans to move to [facility] soon Education provided on the opportunity to visit a food pantry - the patient is concerned this will not provide desired healthy food options Discussed with the patient she may utilize the foods she receives that are beneficial and give the other options away Referral placed to [id number] and [facility] via [id number] platform requesting they contact the patient for assistance Scheduled follow up call over the next two weeks         SDOH assessments and interventions completed:  Yes  SDOH Interventions Today    Flowsheet Row Most Recent Value  SDOH Interventions   Food Insecurity Interventions [id number]  Referral  Housing Interventions Intervention Not Indicated  Transportation Interventions Intervention Not Indicated        Care Coordination Interventions:  Yes, provided   Interventions Today    Flowsheet Row Most Recent Value  Chronic Disease   Chronic disease during today's visit Hypertension (HTN)  General Interventions   General Interventions Discussed/Reviewed General Interventions Discussed, Community Resources  [Referral to [id number] and [facility]]  Nutrition Interventions   Nutrition Discussed/Reviewed Nutrition Discussed        Follow up plan: Follow up call scheduled for 7/10    Encounter Outcome:  Pt. Visit Completed   Bevelyn Ngo, BSW, CDP Social Worker, Certified Dementia Practitioner [facility] Care Management  Care Coordination [phone]

## 2023-05-31 ENCOUNTER — Ambulatory Visit: Payer: Self-pay

## 2023-05-31 NOTE — Patient Outreach (Signed)
  Care Coordination   Follow Up Visit Note   05/31/2023 Name: Amanda Osborne MRN: [id number] DOB: Jan 24, 1951  Amanda Osborne is a 72 y.o. year old female who sees Luking, Jonna Coup, MD for primary care. I spoke with  Amanda Osborne by phone today.  What matters to the patients health and wellness today?  Identify food resources close to home    Goals Addressed             This Visit's Progress    COMPLETED: Care Coordination Activities       Care Coordination Interventions: Discussed the patient received a voice message from [facility] and [facility] both of which SW has referred patient to via [id number] Reviewed locations of each resource to determine [id number] is too far from patients home to access Determined the patient is able to access [facility] as it is close to patients home. Provided patient with contact number for her to contact resource to enroll in program        SDOH assessments and interventions completed:  No     Care Coordination Interventions:  Yes, provided   Interventions Today    Flowsheet Row Most Recent Value  Chronic Disease   Chronic disease during today's visit Hypertension (HTN)  General Interventions   General Interventions Discussed/Reviewed General Interventions Reviewed, Community Resources  [Pt to follow up with [facility] re: food resource needs]        Follow up plan: No further intervention required.   Encounter Outcome:  Pt. Visit Completed   Amanda Osborne, BSW, CDP Social Worker, Certified Dementia Practitioner [facility] Care Management  Care Coordination [phone]

## 2023-05-31 NOTE — Patient Instructions (Signed)
Visit Information  Thank you for taking time to visit with me today. Please don't hesitate to contact me if I can be of assistance to you.   Following are the goals we discussed today:  - Contact [facility] to enroll in food pantry services. Their contact number is [phone]   If you are experiencing a Mental Health or Behavioral Health Crisis or need someone to talk to, please call the [facility]: [phone] call [phone]  Patient verbalizes understanding of instructions and care plan provided today and agrees to view in MyChart. Active MyChart status and patient understanding of how to access instructions and care plan via MyChart confirmed with patient.     No further follow up required: Please contact me as needed  Bevelyn Ngo, BSW, CDP Social Worker, Certified Dementia Practitioner [facility] Care Management  Care Coordination [phone]

## 2023-06-13 DIAGNOSIS — R300 Dysuria: Secondary | ICD-10-CM | POA: Diagnosis not present

## 2023-06-13 DIAGNOSIS — N390 Urinary tract infection, site not specified: Secondary | ICD-10-CM | POA: Diagnosis not present

## 2023-06-13 DIAGNOSIS — A499 Bacterial infection, unspecified: Secondary | ICD-10-CM | POA: Diagnosis not present

## 2023-06-14 ENCOUNTER — Other Ambulatory Visit: Payer: Self-pay

## 2023-06-14 MED ORDER — PANTOPRAZOLE SODIUM 40 MG PO TBEC: 40.0000 mg | DELAYED_RELEASE_TABLET | Freq: Every day | ORAL | 1 refills | Status: DC

## 2023-08-10 ENCOUNTER — Ambulatory Visit: Payer: Medicare Other | Admitting: Family Medicine

## 2023-08-10 VITALS — BP 109/71 | HR 82 | Temp 98.0°F | Wt 188.4 lb

## 2023-08-10 DIAGNOSIS — E7849 Other hyperlipidemia: Secondary | ICD-10-CM | POA: Diagnosis not present

## 2023-08-10 DIAGNOSIS — E038 Other specified hypothyroidism: Secondary | ICD-10-CM | POA: Diagnosis not present

## 2023-08-10 DIAGNOSIS — Z79891 Long term (current) use of opiate analgesic: Secondary | ICD-10-CM

## 2023-08-10 DIAGNOSIS — M25552 Pain in left hip: Secondary | ICD-10-CM | POA: Diagnosis not present

## 2023-08-10 DIAGNOSIS — I10 Essential (primary) hypertension: Secondary | ICD-10-CM

## 2023-08-10 DIAGNOSIS — Z79899 Other long term (current) drug therapy: Secondary | ICD-10-CM

## 2023-08-10 DIAGNOSIS — M25551 Pain in right hip: Secondary | ICD-10-CM | POA: Diagnosis not present

## 2023-08-10 DIAGNOSIS — E559 Vitamin D deficiency, unspecified: Secondary | ICD-10-CM | POA: Diagnosis not present

## 2023-08-10 DIAGNOSIS — F411 Generalized anxiety disorder: Secondary | ICD-10-CM

## 2023-08-10 MED ORDER — PRAVASTATIN SODIUM 20 MG PO TABS: 20.0000 mg | ORAL_TABLET | Freq: Every day | ORAL | 1 refills | Status: DC

## 2023-08-10 MED ORDER — POTASSIUM CHLORIDE ER 10 MEQ PO TBCR: EXTENDED_RELEASE_TABLET | ORAL | 1 refills | Status: DC

## 2023-08-10 MED ORDER — LISINOPRIL 2.5 MG PO TABS: ORAL_TABLET | ORAL | 1 refills | Status: DC

## 2023-08-10 MED ORDER — LEVOTHYROXINE SODIUM 25 MCG PO TABS: ORAL_TABLET | ORAL | 1 refills | Status: DC

## 2023-08-10 MED ORDER — OXYCODONE HCL 10 MG PO TABS: ORAL_TABLET | ORAL | 0 refills | Status: DC

## 2023-08-10 MED ORDER — HYDROCHLOROTHIAZIDE 25 MG PO TABS: ORAL_TABLET | ORAL | 1 refills | Status: DC

## 2023-08-10 MED ORDER — HYDROCHLOROTHIAZIDE 12.5 MG PO TABS: ORAL_TABLET | ORAL | 1 refills | Status: DC

## 2023-08-10 MED ORDER — NORTRIPTYLINE HCL 10 MG PO CAPS: ORAL_CAPSULE | ORAL | 1 refills | Status: DC

## 2023-08-10 MED ORDER — ALPRAZOLAM 0.5 MG PO TABS: ORAL_TABLET | ORAL | 5 refills | Status: DC

## 2023-08-10 NOTE — Progress Notes (Signed)
Subjective:    Patient ID: Amanda Osborne, female    DOB: 1951-03-26, 72 y.o.   MRN: [id number]  HPI This patient was seen today for chronic pain  The medication list was reviewed and updated.   Location of Pain for which the patient has been treated with regarding narcotics: Cervical neck pain  Onset of this pain: Present for years good compliance   -Compliance with medication: Good compliance with medicine  - Number patient states they take daily: 3 daily  -Reason for ongoing use of opioids severe cervical pain radiates into the trapezius bilateral not able to come under decent control with Tylenol  What other measures have been tried outside of opioids Tylenol, NSAIDs  In the ongoing specialists regarding this condition none currently  -when was the last dose patient took? Noon today   The patient was advised the importance of maintaining medication and not using illegal substances with these.  Here for refills and follow up  The patient was educated that we can provide 3 monthly scripts for their medication, it is their responsibility to follow the instructions.  Side effects or complications from medications: Denies side effects  Patient is aware that pain medications are meant to minimize the severity of the pain to allow their pain levels to improve to allow for better function. They are aware of that pain medications cannot totally remove their pain.  Due for UDT ( at least once per year) (pain management contract is also completed at the time of the UDT): 11/07/2022  Scale of 1 to 10 ( 1 is least 10 is most) Your pain level without the medicine: 9 Your pain level with medication 5  Scale 1 to 10 ( 1-helps very little, 10 helps very well) How well does your pain medication reduce your pain so you can function better through out the day? 7  Quality of the pain: Throbbing aching  Persistence of the pain: Persistent all the time  Modifying factors: Worse  with activity  Bilateral hip pain  Encounter for long-term opiate analgesic use  HTN (hypertension), benign  Other specified hypothyroidism  Vitamin D deficiency - Plan: Vitamin D, 25-hydroxy  High risk medication use - Plan: Basic Metabolic Panel  Other hyperlipidemia  Generalized anxiety disorder Patient dealing with anxiety and depression symptoms having a difficult time finding a job partly related to agism Patient also has vitamin D deficiency we will check a level She does take her thyroid medicine regular basis Tries to keep her blood pressure under good control         Review of Systems     Objective:   Physical Exam General-in no acute distress Eyes-no discharge Lungs-respiratory rate normal, CTA CV-no murmurs,RRR Extremities skin warm dry no edema Neuro grossly normal Behavior normal, alert        Assessment & Plan:  1. Bilateral hip pain We discussed stretching exercises if she has ongoing troubles would like to get an x-ray we will do so more than likely because it is more in her buttock region bilateral more likely it is related to impingement of nerves in her lower back  2. Encounter for long-term opiate analgesic use The patient was seen in followup for chronic pain. A review over at their current pain status was discussed. Drug registry was checked. Prescriptions were given.  Regular follow-up recommended. Discussion was held regarding the importance of compliance with medication as well as pain medication contract.  Patient was informed that medication may  cause drowsiness and should not be combined  with other medications/alcohol or street drugs. If the patient feels medication is causing altered alertness then do not drive or operate dangerous equipment.  Should be noted that the patient appears to be meeting appropriate use of opioids and response.  Evidenced by improved function and decent pain control without significant side effects and  no evidence of overt aberrancy issues.  Upon discussion with the patient today they understand that opioid therapy is optional and they feel that the pain has been refractory to reasonable conservative measures and is significant and affecting quality of life enough to warrant ongoing therapy and wishes to continue opioids.  Refills were provided.  [facility] medical Board guidelines regarding the pain medicine has been reviewed.  CDC guidelines most updated 2022 has been reviewed by the prescriber.  PDMP is checked on a regular basis yearly urine drug screen and pain management contract Drug registry was checked 3 prescription sent and patient up-to-date on UDT  3. HTN (hypertension), benign Blood pressure good control  4. Other specified hypothyroidism Previous labs look good continue medication  5. Vitamin D deficiency Check vitamin D, patient recently states that she ordered 5000 units vitamin D to take daily I told her to hold off on this until we get results - Vitamin D, 25-hydroxy  6. High risk medication use Kidney function recommended because of her medications - Basic Metabolic Panel  7. Other hyperlipidemia Healthy diet  8. Generalized anxiety disorder She has been on Xanax for years.  We were able to get her down to the lower dose but because of her ongoing issues it has been very difficult to get her off the medication completely  She denies that the combination of the pain medicine and Xanax causes drowsiness she has been on this combination for years  She has a follow-up in 3 months

## 2023-08-11 LAB — BASIC METABOLIC PANEL
BUN/Creatinine Ratio: 26 (ref 12–28)
BUN: 20 mg/dL (ref 8–27)
CO2: 24 mmol/L (ref 20–29)
Calcium: 9.8 mg/dL (ref 8.7–10.3)
Chloride: 100 mmol/L (ref 96–106)
Creatinine, Ser: 0.76 mg/dL (ref 0.57–1.00)
Glucose: 91 mg/dL (ref 70–99)
Potassium: 4.3 mmol/L (ref 3.5–5.2)
Sodium: 143 mmol/L (ref 134–144)
eGFR: 83 mL/min/{1.73_m2} (ref >59)

## 2023-08-11 LAB — VITAMIN D 25 HYDROXY (VIT D DEFICIENCY, FRACTURES): Vit D, 25-Hydroxy: 56.8 ng/mL (ref 30.0–100.0)

## 2023-10-11 ENCOUNTER — Telehealth: Payer: Self-pay | Admitting: *Deleted

## 2023-10-11 NOTE — Telephone Encounter (Signed)
Source  Amanda Cross D "[name]" (Patient)   Subject  Amanda Cross D "[name]" (Patient)   Topic  Clinical - Medication Refill    Communication  Most Recent Primary Care Visit:  Provider: [name] A     Department: [facility]     Visit Type: OFFICE VISIT     Date: 08/10/2023        Medication:    Oxycodone HCl 10 MG TABS            Has the patient contacted their pharmacy? Yes-needs to be sent over, new pharmacy    (Agent: If no, request that the patient contact the pharmacy for the refill. If patient does not wish to contact the pharmacy document the reason why and proceed with request.)    (Agent: If yes, when and what did the pharmacy advise?)        Is this the correct pharmacy for this prescription? yes    If no, delete pharmacy and type the correct one.    This is the patient's preferred pharmacy:    [id number] - [place], [place] - [address]    [address]    [place] [address]    Phone: [phone] Fax: [phone]            Has the prescription been filled recently? yes        Is the patient out of the medication? yes        Has the patient been seen for an appointment in the last year OR does the patient have an upcoming appointment? yes        Can we respond through MyChart? Yes-Please        Agent: Please be advised that Rx refills may take up to 3 business days. We ask that you follow-up with your pharmacy.

## 2023-10-12 ENCOUNTER — Other Ambulatory Visit: Payer: Self-pay | Admitting: Family Medicine

## 2023-10-12 ENCOUNTER — Encounter: Payer: Self-pay | Admitting: *Deleted

## 2023-10-12 MED ORDER — OXYCODONE HCL 10 MG PO TABS: ORAL_TABLET | ORAL | 0 refills | Status: DC

## 2023-10-12 NOTE — Telephone Encounter (Signed)
PDMP was checked 1 additional prescription was sent to [facility] on scales in that will be due on November 01, 2023  Please keep follow-up visit

## 2023-10-12 NOTE — Telephone Encounter (Signed)
Patient notified via mychart

## 2023-11-09 ENCOUNTER — Ambulatory Visit (INDEPENDENT_AMBULATORY_CARE_PROVIDER_SITE_OTHER): Payer: Medicare Other | Admitting: Family Medicine

## 2023-11-09 VITALS — BP 112/64 | HR 89 | Temp 97.3°F | Ht 65.0 in | Wt 183.8 lb

## 2023-11-09 DIAGNOSIS — Z79891 Long term (current) use of opiate analgesic: Secondary | ICD-10-CM | POA: Diagnosis not present

## 2023-11-09 DIAGNOSIS — I10 Essential (primary) hypertension: Secondary | ICD-10-CM | POA: Diagnosis not present

## 2023-11-09 DIAGNOSIS — F411 Generalized anxiety disorder: Secondary | ICD-10-CM

## 2023-11-09 DIAGNOSIS — E7849 Other hyperlipidemia: Secondary | ICD-10-CM | POA: Diagnosis not present

## 2023-11-09 DIAGNOSIS — E038 Other specified hypothyroidism: Secondary | ICD-10-CM | POA: Diagnosis not present

## 2023-11-09 MED ORDER — OXYCODONE HCL 10 MG PO TABS: ORAL_TABLET | ORAL | 0 refills | Status: DC

## 2023-11-09 NOTE — Progress Notes (Signed)
Subjective:    Patient ID: Amanda Osborne, female    DOB: [birth year], 72 y.o.   MRN: [id number]  Discussed the use of AI scribe software for clinical note transcription with the patient, who gave verbal consent to proceed.  History of Present Illness   The patient, with a history of migraines and chronic pain, reports a recent increase in the frequency and severity of her migraines. She describes the migraines as starting in the forehead and radiating to the back of the head, accompanied by severe nausea. The patient has been managing these episodes with nortriptyline, taking one daily and additional doses as needed when she feels a migraine coming on. She reports that the migraines have been so debilitating that she has been unable to leave her apartment or even pick up her pain medication.  In addition to migraines, the patient also suffers from chronic pain. She has been managing this with a pain medication, the name of which is not specified in the conversation. The patient reports that she usually takes one dose in the morning and a half dose if she is not in significant pain. She also mentions keeping a few extra doses on hand for particularly bad days. The patient denies any side effects from the pain medication, such as drowsiness or feeling "loopy."  The patient also mentions a recent blood work done in September, but no specific results or concerns are discussed. She was informed about the need for a pneumonia booster, but she expressed hesitation about receiving it. The patient's overall mood appears to be affected by her current health status and external factors such as Psychologist, clinical and financial concerns.     This patient was seen today for chronic pain  The medication list was reviewed and updated.   Location of Pain for which the patient has been treated with regarding narcotics: Cervical neck pain  Onset of this pain: Present for years   -Compliance with medication:  Good compliance with medicine  - Number patient states they take daily: 3 or 4/day  -Reason for ongoing use of opioids severe pain in the neck with radiation into the shoulders unrelieved by Tylenol NSAIDs previous surgery  What other measures have been tried outside of opioids see above  In the ongoing specialists regarding this condition previously back specialist  -when was the last dose patient took?  Yesterday  The patient was advised the importance of maintaining medication and not using illegal substances with these.  Here for refills and follow up  The patient was educated that we can provide 3 monthly scripts for their medication, it is their responsibility to follow the instructions.  Side effects or complications from medications: Denies side effects  Patient is aware that pain medications are meant to minimize the severity of the pain to allow their pain levels to improve to allow for better function. They are aware of that pain medications cannot totally remove their pain.  Due for UDT ( at least once per year) (pain management contract is also completed at the time of the UDT): Due today  Scale of 1 to 10 ( 1 is least 10 is most) Your pain level without the medicine: 9 Your pain level with medication 3  Scale 1 to 10 ( 1-helps very little, 10 helps very well) How well does your pain medication reduce your pain so you can function better through out the day?  8  Quality of the pain: Throbbing aching  Persistence of the  pain: Present all the time  Modifying factors: Worse with activity         Review of Systems     Objective:    Physical Exam     General-in no acute distress Eyes-no discharge Lungs-respiratory rate normal, CTA CV-no murmurs,RRR Extremities skin warm dry no edema Neuro grossly normal Behavior normal, alert           Assessment & Plan:  Assessment and Plan    Migraine Frequent migraines with nausea and pressure. Currently  managed with Nortriptyline 1-3 tablets as needed. -Continue current regimen of Nortriptyline.  Chronic Pain Managed with unspecified pain medication. Patient adjusts dose based on pain level. -Renew current pain medication prescription.  Pneumococcal Vaccination Patient is due for a pneumonia booster. Discussed the benefits and risks of the vaccine. -Consider receiving the pneumonia booster.  No need for blood work at this time as recent results from September are available. Follow-up as needed.      Patient continues nortriptyline to help with her migraines The patient was seen in followup for chronic pain. A review over at their current pain status was discussed. Drug registry was checked. Prescriptions were given.  Regular follow-up recommended. Discussion was held regarding the importance of compliance with medication as well as pain medication contract.  Patient was informed that medication may cause drowsiness and should not be combined  with other medications/alcohol or street drugs. If the patient feels medication is causing altered alertness then do not drive or operate dangerous equipment.  Should be noted that the patient appears to be meeting appropriate use of opioids and response.  Evidenced by improved function and decent pain control without significant side effects and no evidence of overt aberrancy issues.  Upon discussion with the patient today they understand that opioid therapy is optional and they feel that the pain has been refractory to reasonable conservative measures and is significant and affecting quality of life enough to warrant ongoing therapy and wishes to continue opioids.  Refills were provided.  [facility] medical Board guidelines regarding the pain medicine has been reviewed.  CDC guidelines most updated 2022 has been reviewed by the prescriber.  PDMP is checked on a regular basis yearly urine drug screen and pain management contract  Patient does  take her cholesterol medicine regular basis tries eat healthy Takes her thyroid medicine regular basis tries eat healthy Takes her blood pressure medicine regular basis tries minimize salt Patient also with baseline anxiety and stress get stressed about world issues also get stressed about not having enough money to afford her food and medications Follow-up 3 months

## 2023-11-10 LAB — MED LIST OPTION NOT SELECTED

## 2023-11-15 LAB — TOXASSURE SELECT 13 (MW), URINE

## 2023-11-15 LAB — SPECIMEN STATUS REPORT

## 2024-01-12 ENCOUNTER — Ambulatory Visit (INDEPENDENT_AMBULATORY_CARE_PROVIDER_SITE_OTHER): Payer: Medicare Other

## 2024-01-12 VITALS — Ht 67.0 in | Wt 181.0 lb

## 2024-01-12 DIAGNOSIS — Z0000 Encounter for general adult medical examination without abnormal findings: Secondary | ICD-10-CM | POA: Diagnosis not present

## 2024-01-12 DIAGNOSIS — Z5320 Procedure and treatment not carried out because of patient's decision for unspecified reasons: Secondary | ICD-10-CM

## 2024-01-12 NOTE — Patient Instructions (Signed)
 Amanda Osborne , Thank you for taking time to come for your Medicare Wellness Visit. I appreciate your ongoing commitment to your health goals. Please review the following plan we discussed and let me know if I can assist you in the future.   Referrals/Orders/Follow-Ups/Clinician Recommendations:   Next Medicare Annual Wellness Visit:  January 18, 2024 at 1:00 pm   This is a list of the screening recommended for you and due dates:  Health Maintenance  Topic Date Due   DTaP/Tdap/Td vaccine (1 - Tdap) Never done   Zoster (Shingles) Vaccine (1 of 2) Never done   Colon Cancer Screening  12/22/2012   Pneumonia Vaccine (2 of 2 - PPSV23 or PCV20) 12/30/2017   Mammogram  02/11/2022   DEXA scan (bone density measurement)  02/20/2023   Flu Shot  02/19/2024*   Medicare Annual Wellness Visit  01/11/2025   Hepatitis C Screening  Completed   HPV Vaccine  Aged Out   COVID-19 Vaccine  Discontinued  *Topic was postponed. The date shown is not the original due date.    Advanced directives: (ACP Link)Information on Advanced Care Planning can be found at [facility] Directives Advance Health Care Directives ([email or web address]   Next Medicare Annual Wellness Visit scheduled for next year: yes  Understanding Your Risk for Falls Millions of people have serious injuries from falls each year. It is important to understand your risk of falling. Talk with your health care provider about your risk and what you can do to lower it. If you do have a serious fall, make sure to tell your provider. Falling once raises your risk of falling again. How can falls affect me? Serious injuries from falls are common. These include: Broken bones, such as hip fractures. Head injuries, such as traumatic brain injuries (TBI) or concussions. A fear of falling can cause you to avoid activities and stay at home. This can make your muscles weaker and raise your risk for a fall. What can increase  my risk? There are a number of risk factors that increase your risk for falling. The more risk factors you have, the higher your risk of falling. Serious injuries from a fall happen most often to people who are older than 74 years old. Teenagers and young adults ages 50-29 are also at higher risk. Common risk factors include: Weakness in the lower body. Being generally weak or confused due to long-term (chronic) illness. Dizziness or balance problems. Poor vision. Medicines that cause dizziness or drowsiness. These may include: Medicines for your blood pressure, heart, anxiety, insomnia, or swelling (edema). Pain medicines. Muscle relaxants. Other risk factors include: Drinking alcohol. Having had a fall in the past. Having foot pain or wearing improper footwear. Working at a dangerous job. Having any of the following in your home: Tripping hazards, such as floor clutter or loose rugs. Poor lighting. Pets. Having dementia or memory loss. What actions can I take to lower my risk of falling?     Physical activity Stay physically fit. Do strength and balance exercises. Consider taking a regular class to build strength and balance. Yoga and tai chi are good options. Vision Have your eyes checked every year and your prescription for glasses or contacts updated as needed. Shoes and walking aids Wear non-skid shoes. Wear shoes that have rubber soles and low heels. Do not wear high heels. Do not walk around the house in socks or slippers. Use a cane or walker as told by your provider.  Home safety Attach secure railings on both sides of your stairs. Install grab bars for your bathtub, shower, and toilet. Use a non-skid mat in your bathtub or shower. Attach bath mats securely with double-sided, non-slip rug tape. Use good lighting in all rooms. Keep a flashlight near your bed. Make sure there is a clear path from your bed to the bathroom. Use night-lights. Do not use throw rugs. Make  sure all carpeting is taped or tacked down securely. Remove all clutter from walkways and stairways, including extension cords. Repair uneven or broken steps and floors. Avoid walking on icy or slippery surfaces. Walk on the grass instead of on icy or slick sidewalks. Use ice melter to get rid of ice on walkways in the winter. Use a cordless phone. Questions to ask your health care provider Can you help me check my risk for a fall? Do any of my medicines make me more likely to fall? Should I take a vitamin D supplement? What exercises can I do to improve my strength and balance? Should I make an appointment to have my vision checked? Do I need a bone density test to check for weak bones (osteoporosis)? Would it help to use a cane or a walker? Where to find more information Centers for Disease Control and Prevention, STEADI: TonerPromos.no Community-Based Fall Prevention Programs: TonerPromos.no [employer] on Aging: BaseRingTones.pl Contact a health care provider if: You fall at home. You are afraid of falling at home. You feel weak, drowsy, or dizzy. This information is not intended to replace advice given to you by your health care provider. Make sure you discuss any questions you have with your health care provider. Document Revised: 07/11/2022 Document Reviewed: 07/11/2022 Elsevier Patient Education  2024 Elsevier Inc.   Managing Pain Without Opioids Opioids are strong medicines used to treat moderate to severe pain. For some people, especially those who have long-term (chronic) pain, opioids may not be the best choice for pain management due to: Side effects like nausea, constipation, and sleepiness. The risk of addiction (opioid use disorder). The longer you take opioids, the greater your risk of addiction. Pain that lasts for more than 3 months is called chronic pain. Managing chronic pain usually requires more than one approach and is often provided by a team of health care providers  working together (multidisciplinary approach). Pain management may be done at a pain management center or pain clinic. How to manage pain without the use of opioids Use non-opioid medicines Non-opioid medicines for pain may include: Over-the-counter or prescription non-steroidal anti-inflammatory drugs (NSAIDs). These may be the first medicines used for pain. They work well for muscle and bone pain, and they reduce swelling. Acetaminophen. This over-the-counter medicine may work well for milder pain but not swelling. Antidepressants. These may be used to treat chronic pain. A certain type of antidepressant (tricyclics) is often used. These medicines are given in lower doses for pain than when used for depression. Anticonvulsants. These are usually used to treat seizures but may also reduce nerve (neuropathic) pain. Muscle relaxants. These relieve pain caused by sudden muscle tightening (spasms). You may also use a pain medicine that is applied to the skin as a patch, cream, or gel (topical analgesic), such as a numbing medicine. These may cause fewer side effects than medicines taken by mouth. Do certain therapies as directed Some therapies can help with pain management. They include: Physical therapy. You will do exercises to gain strength and flexibility. A physical therapist may teach you exercises  to move and stretch parts of your body that are weak, stiff, or painful. You can learn these exercises at physical therapy visits and practice them at home. Physical therapy may also involve: Massage. Heat wraps or applying heat or cold to affected areas. Electrical signals that interrupt pain signals (transcutaneous electrical nerve stimulation, TENS). Weak lasers that reduce pain and swelling (low-level laser therapy). Signals from your body that help you learn to regulate pain (biofeedback). Occupational therapy. This helps you to learn ways to function at home and work with less  pain. Recreational therapy. This involves trying new activities or hobbies, such as a physical activity or drawing. Mental health therapy, including: Cognitive behavioral therapy (CBT). This helps you learn coping skills for dealing with pain. Acceptance and commitment therapy (ACT) to change the way you think and react to pain. Relaxation therapies, including muscle relaxation exercises and mindfulness-based stress reduction. Pain management counseling. This may be individual, family, or group counseling.  Receive medical treatments Medical treatments for pain management include: Nerve block injections. These may include a pain blocker and anti-inflammatory medicines. You may have injections: Near the spine to relieve chronic back or neck pain. Into joints to relieve back or joint pain. Into nerve areas that supply a painful area to relieve body pain. Into muscles (trigger point injections) to relieve some painful muscle conditions. A medical device placed near your spine to help block pain signals and relieve nerve pain or chronic back pain (spinal cord stimulation device). Acupuncture. Follow these instructions at home Medicines Take over-the-counter and prescription medicines only as told by your health care provider. If you are taking pain medicine, ask your health care providers about possible side effects to watch out for. Do not drive or use heavy machinery while taking prescription opioid pain medicine. Lifestyle  Do not use drugs or alcohol to reduce pain. If you drink alcohol, limit how much you have to: 0-1 drink a day for women who are not pregnant. 0-2 drinks a day for men. Know how much alcohol is in a drink. In the [place] one drink equals one 12 oz bottle of beer (355 mL), one 5 oz glass of wine (148 mL), or one 1 oz glass of hard liquor (44 mL). Do not use any products that contain nicotine or tobacco. These products include cigarettes, chewing tobacco, and vaping  devices, such as e-cigarettes. If you need help quitting, ask your health care provider. Eat a healthy diet and maintain a healthy weight. Poor diet and excess weight may make pain worse. Eat foods that are high in fiber. These include fresh fruits and vegetables, whole grains, and beans. Limit foods that are high in fat and processed sugars, such as fried and sweet foods. Exercise regularly. Exercise lowers stress and may help relieve pain. Ask your health care provider what activities and exercises are safe for you. If your health care provider approves, join an exercise class that combines movement and stress reduction. Examples include yoga and tai chi. Get enough sleep. Lack of sleep may make pain worse. Lower stress as much as possible. Practice stress reduction techniques as told by your therapist. General instructions Work with all your pain management providers to find the treatments that work best for you. You are an important member of your pain management team. There are many things you can do to reduce pain on your own. Consider joining an online or in-person support group for people who have chronic pain. Keep all follow-up visits. This is  important. Where to find more information You can find more information about managing pain without opioids from: American Academy of Pain Medicine: painmed.org Institute for Chronic Pain: instituteforchronicpain.org American Chronic Pain Association: theacpa.org Contact a health care provider if: You have side effects from pain medicine. Your pain gets worse or does not get better with treatments or home therapy. You are struggling with anxiety or depression. Summary Many types of pain can be managed without opioids. Chronic pain may respond better to pain management without opioids. Pain is best managed when you and a team of health care providers work together. Pain management without opioids may include non-opioid medicines, medical  treatments, physical therapy, mental health therapy, and lifestyle changes. Tell your health care providers if your pain gets worse or is not being managed well enough. This information is not intended to replace advice given to you by your health care provider. Make sure you discuss any questions you have with your health care provider. Document Revised: 02/17/2021 Document Reviewed: 02/17/2021 Elsevier Patient Education  2024 [employer].

## 2024-01-12 NOTE — Progress Notes (Signed)
 Please attest and cosign this visit due to patients primary care provider not being in the office at the time the visit was completed.   Subjective:   Amanda Osborne is a 73 y.o. who presents for a Medicare Wellness preventive visit.  Visit Complete: Virtual I connected with  [name] on 01/12/24 by a audio enabled telemedicine application and verified that I am speaking with the correct person using two identifiers.  Patient Location: Home  Provider Location: Home Office  I discussed the limitations of evaluation and management by telemedicine. The patient expressed understanding and agreed to proceed.  Vital Signs: Because this visit was a virtual/telehealth visit, some criteria may be missing or patient reported. Any vitals not documented were not able to be obtained and vitals that have been documented are patient reported.  VideoDeclined- This patient declined Librarian, academic. Therefore the visit was completed with audio only.  AWV Questionnaire: No: Patient Medicare AWV questionnaire was not completed prior to this visit.  Cardiac Risk Factors include: advanced age (>85men, >21 women);dyslipidemia;hypertension;sedentary lifestyle     Objective:    Today's Vitals   01/12/24 1306  Weight: 181 lb (82.1 kg)  Height: 5\' 7"  (1.702 m)   Body mass index is 28.35 kg/m.     08/31/2022    2:35 PM 08/14/2021    1:09 PM 06/14/2018   11:30 AM 06/11/2018    1:22 PM 10/22/2015   10:56 AM 08/28/2015    8:18 AM 12/01/2014   10:59 AM  Advanced Directives  Does Patient Have a Medical Advance Directive? No Yes Yes Yes No Yes Yes  Type of Special educational needs teacher of [place];Living will Living will Living will  Living will Living will  Does patient want to make changes to medical advance directive?   No - Patient declined No - Patient declined     Copy of Healthcare Power of Attorney in Chart?      No - copy requested   Would patient like  information on creating a medical advance directive? No - Patient declined    No - patient declined information      Current Medications (verified) Outpatient Encounter Medications as of 01/12/2024  Medication Sig   acetaminophen (TYLENOL) 500 MG tablet Take 500 mg by mouth every 6 (six) hours as needed.   ALPRAZolam (XANAX) 0.5 MG tablet TAKE 1 TABLET BY MOUTH TWICE DAILY AS NEEDED FOR ANXIETY   famotidine (PEPCID) 20 MG tablet Take 1 tablet (20 mg total) by mouth 2 (two) times daily as needed for heartburn or indigestion.   fluticasone (FLONASE) 50 MCG/ACT nasal spray INSTILL 2 SPRAYS INTO EACH NOSTRIL ONCE DAILY   hydrochlorothiazide (HYDRODIURIL) 25 MG tablet TAKE 1 TABLET(25 MG) BY MOUTH DAILY   ibuprofen (ADVIL) 600 MG tablet Take 1 tablet (600 mg total) by mouth every 8 (eight) hours as needed.   Iron-Vitamin C (VITRON-C PO) Take 65 mg by mouth 2 (two) times daily.   levothyroxine (SYNTHROID) 25 MCG tablet TAKE 4 TABLETS BY MOUTH EVERY SATURDAY AND 5 TABLETS EVERY WEDNESDAY   lisinopril (ZESTRIL) 2.5 MG tablet 1 qd   loratadine (CLARITIN) 10 MG tablet Take 10 mg by mouth daily.   nortriptyline (PAMELOR) 10 MG capsule 1 to 2 qhs prn as directed   Oxycodone HCl 10 MG TABS One tid prn pain   Oxycodone HCl 10 MG TABS 1 tid prn pain   Oxycodone HCl 10 MG TABS One tid prn pain  pantoprazole (PROTONIX) 40 MG tablet Take 1 tablet (40 mg total) by mouth daily.   polyethylene glycol powder (GLYCOLAX/MIRALAX) powder Take 17 g by mouth daily as needed (for constipation).   potassium chloride (KLOR-CON) 10 MEQ tablet TAKE 1 TABLET(10 MEQ) BY MOUTH DAILY   pravastatin (PRAVACHOL) 20 MG tablet Take 1 tablet (20 mg total) by mouth daily.   No facility-administered encounter medications on file as of 01/12/2024.    Allergies (verified) Topamax [topiramate], Celexa [citalopram hydrobromide], Fosamax [alendronate sodium], Neurontin [gabapentin], and Carisoprodol   History: Past Medical History:   Diagnosis Date   Anxiety    Arthritis    Cervical disc herniation    c5 and c6   Cervicogenic headache 05/13/2019   Chronic pain    Complication of anesthesia    Fibromyalgia    GERD (gastroesophageal reflux disease)    Headache(784.0)    Hx: of migraines until age 76's   Hyperlipidemia    Hypertension    Iron deficiency anemia 11/30/2012   Left carpal tunnel syndrome 11/07/2018   Narcolepsy    Occipital neuralgia    PONV (postoperative nausea and vomiting)    DURING FIRST NECK SURGERY ; BUT REPORT IT MAY HAVE BEEN THE DILAUDID THAT CAUSED THE VOITING I   Unspecified vitamin D deficiency 03/26/2013   Wrist fracture, bilateral 2005 and 2010   right then left   Past Surgical History:  Procedure Laterality Date   ANTERIOR CERVICAL DECOMP/DISCECTOMY FUSION  2000   C6-C7 DR [facility]    ANTERIOR CERVICAL DECOMP/DISCECTOMY FUSION N/A 08/05/2013   Procedure: Cervical five-six Anterior cervical decompression/diskectomy/fusion/ Synthes plate removal;  Surgeon: Barnett Abu, MD;  Location: [facility];  Service: Neurosurgery;  Laterality: N/A;   BONE GRAFT HIP ILIAC CREST  2006   to right wrist to correct fracture, DR. Millston ORTHO    COLONOSCOPY     Hx; of   DILATION AND CURETTAGE OF UTERUS     ESOPHAGEAL DILATION N/A 10/22/2015   Procedure: ESOPHAGEAL DILATION;  Surgeon: Malissa Hippo, MD;  Location: [facility];  Service: Endoscopy;  Laterality: N/A;   ESOPHAGOGASTRODUODENOSCOPY N/A 08/28/2015   Procedure: ESOPHAGOGASTRODUODENOSCOPY (EGD);  Surgeon: Malissa Hippo, MD;  Location: [facility];  Service: Endoscopy;  Laterality: N/A;  910am   ESOPHAGOGASTRODUODENOSCOPY N/A 10/22/2015   Procedure: ESOPHAGOGASTRODUODENOSCOPY (EGD);  Surgeon: Malissa Hippo, MD;  Location: [facility];  Service: Endoscopy;  Laterality: N/A;  12:00   FRACTURE SURGERY     Hx: of right wrist surgery   GASTRIC BYPASS  1981   INCISIONAL HERNIA REPAIR N/A 06/14/2018   Procedure: LAPAROSCOPIC INCISIONAL  HERNIA REPAIR WITH INSERTION OF MESH ERAS PATHWAY;  Surgeon: Kinsinger, De Blanch, MD;  Location: [facility];  Service: General;  Laterality: N/A;   TONSILLECTOMY     Family History  Problem Relation Age of Onset   Stroke Mother    Heart disease Mother    Diabetes Mother    Heart disease Father    Cancer - Colon Father    Other Brother    Social History   Socioeconomic History   Marital status: Divorced    Spouse name: Not on file   Number of children: 1   Years of education: HS   Highest education level: Not on file  Occupational History   Occupation: retired  Tobacco Use   Smoking status: Never   Smokeless tobacco: Never  Vaping Use   Vaping status: Never Used  Substance and Sexual Activity  Alcohol use: No   Drug use: No   Sexual activity: Not Currently    Birth control/protection: Post-menopausal  Other Topics Concern   Not on file  Social History Narrative   Patient lives at home alone.   Caffeine Use: daily (BC podwder)   Social Drivers of Health   Financial Resource Strain: High Risk (01/12/2024)   Overall Financial Resource Strain (CARDIA)    Difficulty of Paying Living Expenses: Very hard  Food Insecurity: No Food Insecurity (01/12/2024)   Hunger Vital Sign    Worried About Running Out of Food in the Last Year: Never true    Ran Out of Food in the Last Year: Never true  Transportation Needs: No Transportation Needs (01/12/2024)   PRAPARE - Administrator, Civil Service (Medical): No    Lack of Transportation (Non-Medical): No  Physical Activity: Insufficiently Active (01/12/2024)   Exercise Vital Sign    Days of Exercise per Week: 3 days    Minutes of Exercise per Session: 20 min  Stress: No Stress Concern Present (01/12/2024)   [employer] of Occupational Health - Occupational Stress Questionnaire    Feeling of Stress : Not at all  Social Connections: Patient Declined (01/12/2024)   Social Connection and Isolation Panel [NHANES]     Frequency of Communication with Friends and Family: Patient declined    Frequency of Social Gatherings with Friends and Family: Patient declined    Attends Religious Services: Patient declined    Database administrator or Organizations: Patient declined    Attends Engineer, structural: Patient declined    Marital Status: Patient declined    Tobacco Counseling Counseling given: Yes    Clinical Intake:  Pre-visit preparation completed: Yes  Pain : No/denies pain     BMI - recorded: 28.35 Nutritional Status: BMI 25 -29 Overweight Nutritional Risks: None Diabetes: No  How often do you need to have someone help you when you read instructions, pamphlets, or other written materials from your doctor or pharmacy?: 1 - Never  Interpreter Needed?: No  Information entered by :: Maryjean Ka CMA   Activities of Daily Living     01/12/2024    1:10 PM  In your present state of health, do you have any difficulty performing the following activities:  Hearing? 1  Comment declined referral for hearing test  Vision? 0  Difficulty concentrating or making decisions? 0  Walking or climbing stairs? 0  Dressing or bathing? 0  Doing errands, shopping? 0  Preparing Food and eating ? N  Using the Toilet? N  In the past six months, have you accidently leaked urine? N  Do you have problems with loss of bowel control? N  Managing your Medications? N  Managing your Finances? N  Housekeeping or managing your Housekeeping? N    Patient Care Team: Babs Sciara, MD as PCP - General (Family Medicine) Salli Real, MD as Referring Physician (Internal Medicine)  Indicate any recent Medical Services you may have received from other than [facility] providers in the past year (date may be approximate).     Assessment:   This is a routine wellness examination for [place].  Hearing/Vision screen Hearing Screening - Comments:: Patient complains of difficulty with hearing. Patient declines referral  today.  Vision Screening - Comments:: Wears rx glasses. Patient states she has had an eye exam in the last 2 years. Declines referral today due to cost.    Goals Addressed  This Visit's Progress    Patient Stated       To make it through one day at a time       Depression Screen     01/12/2024    1:12 PM 11/09/2023    2:16 PM 08/10/2023    1:59 PM 02/08/2023    2:50 PM 11/07/2022   10:38 AM 08/31/2022    2:34 PM 05/17/2022    4:11 PM  PHQ 2/9 Scores  PHQ - 2 Score 0 1 1 1  0 0 0  PHQ- 9 Score 3 2 3 3 4  0     Fall Risk     01/12/2024    1:10 PM 08/10/2023    1:59 PM 08/10/2023    1:11 PM 02/08/2023    2:50 PM 11/07/2022   10:38 AM  Fall Risk   Falls in the past year? 0 1 1 1 1   Number falls in past yr: 0 0 0 0 0  Injury with Fall? 0 0 0 0 0  Risk for fall due to : No Fall Risks   History of fall(s) History of fall(s)  Follow up Falls prevention discussed;Falls evaluation completed   Falls evaluation completed Falls evaluation completed    MEDICARE RISK AT HOME:  Medicare Risk at Home Any stairs in or around the home?: No If so, are there any without handrails?: No Home free of loose throw rugs in walkways, pet beds, electrical cords, etc?: Yes Adequate lighting in your home to reduce risk of falls?: Yes Life alert?: No Use of a cane, walker or w/c?: No Grab bars in the bathroom?: No Shower chair or bench in shower?: No Elevated toilet seat or a handicapped toilet?: No  TIMED UP AND GO:  Was the test performed?  No  Cognitive Function: 6CIT completed        01/12/2024    1:08 PM 08/14/2021    1:14 PM  6CIT Screen  What Year? 0 points 0 points  What month? 0 points 0 points  What time? 0 points 0 points  Count back from 20 0 points 0 points  Months in reverse 0 points 0 points  Repeat phrase 0 points 0 points  Total Score 0 points 0 points    Immunizations Immunization History  Administered Date(s) Administered   Moderna Sars-Covid-2  Vaccination 02/14/2020, 03/18/2020, 12/14/2020   Pneumococcal Conjugate-13 12/30/2016    Screening Tests Health Maintenance  Topic Date Due   DTaP/Tdap/Td (1 - Tdap) Never done   Zoster Vaccines- Shingrix (1 of 2) Never done   Colonoscopy  12/22/2012   Pneumonia Vaccine 64+ Years old (2 of 2 - PPSV23 or PCV20) 12/30/2017   MAMMOGRAM  02/11/2022   DEXA SCAN  02/20/2023   INFLUENZA VACCINE  02/19/2024 (Originally 06/22/2023)   Medicare Annual Wellness (AWV)  01/11/2025   Hepatitis C Screening  Completed   HPV VACCINES  Aged Out   COVID-19 Vaccine  Discontinued    Health Maintenance  Health Maintenance Due  Topic Date Due   DTaP/Tdap/Td (1 - Tdap) Never done   Zoster Vaccines- Shingrix (1 of 2) Never done   Colonoscopy  12/22/2012   Pneumonia Vaccine 52+ Years old (2 of 2 - PPSV23 or PCV20) 12/30/2017   MAMMOGRAM  02/11/2022   DEXA SCAN  02/20/2023   Health Maintenance Items Addressed: Patient declined Mammogram and GI referral.   Additional Screening:  Vision Screening: Recommended annual ophthalmology exams for early detection of glaucoma and other disorders of  the eye.  Dental Screening: Recommended annual dental exams for proper oral hygiene  Community Resource Referral / Chronic Care Management: CRR required this visit?  No   CCM required this visit?  No     Plan:     I have personally reviewed and noted the following in the patient's chart:   Medical and social history Use of alcohol, tobacco or illicit drugs  Current medications and supplements including opioid prescriptions. Patient is currently taking opioid prescriptions. Information provided to patient regarding non-opioid alternatives. Patient advised to discuss non-opioid treatment plan with their provider. Functional ability and status Nutritional status Physical activity Advanced directives List of other physicians Hospitalizations, surgeries, and ER visits in previous 12  months Vitals Screenings to include cognitive, depression, and falls Referrals and appointments  In addition, I have reviewed and discussed with patient certain preventive protocols, quality metrics, and best practice recommendations. A written personalized care plan for preventive services as well as general preventive health recommendations were provided to patient.     Jordan Hawks Niamh Rada, CMA   01/12/2024   After Visit Summary: (MyChart) Due to this being a telephonic visit, the after visit summary with patients personalized plan was offered to patient via MyChart   Notes: Please refer to Routing Comments.

## 2024-02-13 ENCOUNTER — Encounter: Payer: Self-pay | Admitting: Family Medicine

## 2024-02-13 ENCOUNTER — Ambulatory Visit (INDEPENDENT_AMBULATORY_CARE_PROVIDER_SITE_OTHER): Payer: Medicare Other | Admitting: Family Medicine

## 2024-02-13 VITALS — BP 120/64 | HR 95 | Temp 98.6°F | Ht 67.0 in | Wt 177.0 lb

## 2024-02-13 DIAGNOSIS — Z13820 Encounter for screening for osteoporosis: Secondary | ICD-10-CM

## 2024-02-13 DIAGNOSIS — Z79899 Other long term (current) drug therapy: Secondary | ICD-10-CM

## 2024-02-13 DIAGNOSIS — R739 Hyperglycemia, unspecified: Secondary | ICD-10-CM

## 2024-02-13 DIAGNOSIS — E038 Other specified hypothyroidism: Secondary | ICD-10-CM

## 2024-02-13 DIAGNOSIS — Z1231 Encounter for screening mammogram for malignant neoplasm of breast: Secondary | ICD-10-CM

## 2024-02-13 DIAGNOSIS — E7849 Other hyperlipidemia: Secondary | ICD-10-CM | POA: Diagnosis not present

## 2024-02-13 DIAGNOSIS — Z5941 Food insecurity: Secondary | ICD-10-CM | POA: Diagnosis not present

## 2024-02-13 DIAGNOSIS — F411 Generalized anxiety disorder: Secondary | ICD-10-CM | POA: Diagnosis not present

## 2024-02-13 DIAGNOSIS — I10 Essential (primary) hypertension: Secondary | ICD-10-CM

## 2024-02-13 DIAGNOSIS — Z79891 Long term (current) use of opiate analgesic: Secondary | ICD-10-CM

## 2024-02-13 MED ORDER — OXYCODONE HCL 10 MG PO TABS
ORAL_TABLET | ORAL | 0 refills | Status: DC
Start: 2024-02-13 — End: 2024-05-20

## 2024-02-13 MED ORDER — PANTOPRAZOLE SODIUM 40 MG PO TBEC: 40.0000 mg | DELAYED_RELEASE_TABLET | Freq: Every day | ORAL | 1 refills | Status: DC

## 2024-02-13 MED ORDER — FLUTICASONE PROPIONATE 50 MCG/ACT NA SUSP: NASAL | 9 refills | Status: AC

## 2024-02-13 MED ORDER — FAMOTIDINE 20 MG PO TABS: 20.0000 mg | ORAL_TABLET | Freq: Two times a day (BID) | ORAL | 1 refills | Status: DC | PRN

## 2024-02-13 MED ORDER — LEVOTHYROXINE SODIUM 25 MCG PO TABS: ORAL_TABLET | ORAL | 1 refills | Status: DC

## 2024-02-13 MED ORDER — LISINOPRIL 2.5 MG PO TABS: ORAL_TABLET | ORAL | 1 refills | Status: DC

## 2024-02-13 MED ORDER — OXYCODONE HCL 10 MG PO TABS: ORAL_TABLET | ORAL | 0 refills | Status: DC

## 2024-02-13 MED ORDER — HYDROCHLOROTHIAZIDE 25 MG PO TABS: ORAL_TABLET | ORAL | 1 refills | Status: DC

## 2024-02-13 MED ORDER — NORTRIPTYLINE HCL 10 MG PO CAPS: ORAL_CAPSULE | ORAL | 1 refills | Status: DC

## 2024-02-13 MED ORDER — PRAVASTATIN SODIUM 20 MG PO TABS: 20.0000 mg | ORAL_TABLET | Freq: Every day | ORAL | 1 refills | Status: DC

## 2024-02-13 NOTE — Progress Notes (Signed)
 Subjective:    Patient ID: Amanda Osborne, female    DOB: 05/08/1951, 73 y.o.   MRN: [id number]  HPIEncounter for long-term opiate analgesic use  This patient was seen today for chronic pain  The medication list was reviewed and updated.   Location of Pain for which the patient has been treated with regarding narcotics: Cervical pain  Onset of this pain: Present for years   -Compliance with medication: Good compliance with medicine  - Number patient states they take daily: 3/day  -Reason for ongoing use of opioids NSAIDs Tylenol does not help enough with gastric bypass surgery cannot take NSAIDs  What other measures have been tried outside of opioids previous surgery  In the ongoing specialists regarding this condition intermittently with a back specialist  -when was the last dose patient took?  Past 24 hours  The patient was advised the importance of maintaining medication and not using illegal substances with these.  Here for refills and follow up  The patient was educated that we can provide 3 monthly scripts for their medication, it is their responsibility to follow the instructions.  Side effects or complications from medications: Denies side effects  Patient is aware that pain medications are meant to minimize the severity of the pain to allow their pain levels to improve to allow for better function. They are aware of that pain medications cannot totally remove their pain.  Due for UDT ( at least once per year) (pain management contract is also completed at the time of the UDT): Up-to-date  Scale of 1 to 10 ( 1 is least 10 is most) Your pain level without the medicine: 8 Your pain level with medication 2  Scale 1 to 10 ( 1-helps very little, 10 helps very well) How well does your pain medication reduce your pain so you can function better through out the day?  8  Quality of the pain: Throbbing aching  Persistence of the pain: Present on time  Modifying  factors: Worse with activity  Generalized anxiety disorder  HTN (hypertension), benign  Encounter for long-term opiate analgesic use  Other hyperlipidemia - Plan: Lipid panel  Other specified hypothyroidism - Plan: TSH + free T4  Hyperglycemia - Plan: Hemoglobin A1c  Screening mammogram for breast cancer - Plan: MM 3D SCREENING MAMMOGRAM BILATERAL BREAST  Screening for osteoporosis - Plan: DG Bone Density  Food insecurity - Plan: Ambulatory referral to Social Work  High risk medication use - Plan: Basic metabolic panel, Hepatic function panel  Patient does have underlying anxiety related issues.  Has difficult time affording medications and food.  Under a lot of stress with recent governmental changes Patient does take her blood pressure medicine regular basis Does take her cholesterol medicine denies side effects Takes her thyroid medicine regular basis Tries to watch closely starches in her diet Does have a history of osteoporosis we will check bone density       Review of Systems     Objective:   Physical Exam  General-in no acute distress Eyes-no discharge Lungs-respiratory rate normal, CTA CV-no murmurs,RRR Extremities skin warm dry no edema Neuro grossly normal Behavior normal, alert       Assessment & Plan:   1. Generalized anxiety disorder (Primary) She has had this problem for years.  Has been on Xanax for years.  We have tried to taper down to the smallest amount of tolerable Despite all this patient does take ongoing Xanax daily she denies drowsiness with  2.  HTN (hypertension), benign HTN- patient seen for follow-up regarding HTN.   Diet, medication compliance, appropriate labs and refills were completed.   Importance of keeping blood pressure under good control to lessen the risk of complications discussed Regular follow-up visits discussed   3. Encounter for long-term opiate analgesic use The patient was seen in followup for chronic  pain. A review over at their current pain status was discussed. Drug registry was checked. Prescriptions were given.  Regular follow-up recommended. Discussion was held regarding the importance of compliance with medication as well as pain medication contract.  Patient was informed that medication may cause drowsiness and should not be combined  with other medications/alcohol or street drugs. If the patient feels medication is causing altered alertness then do not drive or operate dangerous equipment.  Should be noted that the patient appears to be meeting appropriate use of opioids and response.  Evidenced by improved function and decent pain control without significant side effects and no evidence of overt aberrancy issues.  Upon discussion with the patient today they understand that opioid therapy is optional and they feel that the pain has been refractory to reasonable conservative measures and is significant and affecting quality of life enough to warrant ongoing therapy and wishes to continue opioids.  Refills were provided.  [facility] medical Board guidelines regarding the pain medicine has been reviewed.  CDC guidelines most updated 2022 has been reviewed by the prescriber.  PDMP is checked on a regular basis yearly urine drug screen and pain management contract  Treatment plan for this patient includes #1-gentle stretching exercises as shown daily basis 2.  Mild strength exercises 3 times per week #3 continue pain medications #4 notify us if any digression  Patient has been on the same and similar pain medication for years.  Tolerating well.  Denies any drowsiness with the medicine.  She has been reliable with her medicines.  Urine drug screens have checked out well.  PDMP has checked out well.  Will continue current measures 3 prescription sent in follow-up 3 months  4. Other hyperlipidemia Patient here for follow-up regarding cholesterol.    Patient relates taking medication on  a regular basis Denies problems with medication Importance of dietary measures discussed Regular lab work regarding lipid and liver was checked and if needing additional labs was appropriately ordered  - Lipid panel  5. Other specified hypothyroidism Patient was seen today regarding hypothyroidism.  Importance of healthy diet, regular physical activity was discussed.  Importance of compliance with medication and regular checks regarding this was discussed.   - TSH + free T4  6. Hyperglycemia Patient tolerating food as best she can has difficult time affording food, glucose has been elevated check A1c - Hemoglobin A1c  7. Screening mammogram for breast cancer Screening - MM 3D SCREENING MAMMOGRAM BILATERAL BREAST  8. Screening for osteoporosis History of osteoporosis check bone density - DG Bone Density  9. Food insecurity Patient wants referral to local food bank social services with [facility] will help - Ambulatory referral to Social Work  10. High risk medication use High risk meds check labs - Basic metabolic panel - Hepatic function panel Follow-up 3 months

## 2024-02-22 ENCOUNTER — Telehealth: Payer: Self-pay | Admitting: *Deleted

## 2024-02-22 NOTE — Progress Notes (Signed)
 Complex Care Management Note Care Guide Note  02/22/2024 Name: Amanda Osborne MRN: [id number] DOB: [birth year]   Complex Care Management Outreach Attempts: An unsuccessful telephone outreach was attempted today to offer the patient information about available complex care management services.  Follow Up Plan:  Additional outreach attempts will be made to offer the patient complex care management information and services.   Encounter Outcome:  No Answer  Burman Nieves, CMA, [place]  [facility], [facility] Guide Direct Dial: [phone]  Fax: [phone] Website: [email or web address]

## 2024-02-23 NOTE — Progress Notes (Signed)
 Complex Care Management Note Care Guide Note  02/23/2024 Name: Amanda Osborne MRN: [id number] DOB: [birth year]   Complex Care Management Outreach Attempts: A second unsuccessful outreach was attempted today to offer the patient with information about available complex care management services.  Follow Up Plan:  Additional outreach attempts will be made to offer the patient complex care management information and services.   Encounter Outcome:  No Answer  Burman Nieves, CMA [place]  [facility], [facility] Guide Direct Dial: [phone]  Fax: [phone] Website: [email or web address]

## 2024-02-26 NOTE — Progress Notes (Signed)
 Complex Care Management Note Care Guide Note  02/26/2024 Name: Amanda Osborne MRN: [id number] DOB: 1951/03/03   Complex Care Management Outreach Attempts: A third unsuccessful outreach was attempted today to offer the patient with information about available complex care management services.  Follow Up Plan:  No further outreach attempts will be made at this time. We have been unable to contact the patient to offer or enroll patient in complex care management services.  Encounter Outcome:  No Answer  Burman Nieves, CMA, [place]  [facility], [facility] Guide Direct Dial: [phone]  Fax: [phone] Website: [email or web address]

## 2024-03-05 ENCOUNTER — Other Ambulatory Visit: Payer: Self-pay | Admitting: Family Medicine

## 2024-03-05 MED ORDER — ALPRAZOLAM 0.5 MG PO TABS: ORAL_TABLET | ORAL | 2 refills | Status: DC

## 2024-03-05 NOTE — Telephone Encounter (Signed)
 Copied from CRM [id number]. Topic: Clinical - Medication Refill >> Mar 05, 2024  1:56 PM Donald Frost wrote: Most Recent Primary Care Visit:  Provider: Charlotta Cook A  Department: [facility]  Visit Type: OFFICE VISIT  Date: 02/13/2024  Medication: ALPRAZolam (XANAX) 0.5 MG tablet  Has the patient contacted their pharmacy? Yes (  Is this the correct pharmacy for this prescription? Yes If no, delete pharmacy and type the correct one.  This is the patient's preferred pharmacy:  [id number] - [place], [place] - [address] [place] [address] Phone: [phone] Fax: [phone]   Has the prescription been filled recently? No  Is the patient out of the medication? Yes  Has the patient been seen for an appointment in the last year OR does the patient have an upcoming appointment? Yes  Can we respond through MyChart? Yes  The patient spoke with the pharmacy and they told her they were sending over a request this past week but I do not see it. She says she has been out of her meds since yesterday. Please assist patient as soon as possible

## 2024-05-09 ENCOUNTER — Other Ambulatory Visit: Payer: Self-pay

## 2024-05-09 MED ORDER — NORTRIPTYLINE HCL 10 MG PO CAPS: ORAL_CAPSULE | ORAL | 0 refills | Status: DC

## 2024-05-17 ENCOUNTER — Ambulatory Visit (HOSPITAL_COMMUNITY)
Admission: RE | Admit: 2024-05-17 | Discharge: 2024-05-17 | Disposition: A | Discharge status: Home / Self Care | Source: Ambulatory Visit | Attending: Family Medicine | Admitting: Family Medicine

## 2024-05-17 DIAGNOSIS — R739 Hyperglycemia, unspecified: Secondary | ICD-10-CM | POA: Diagnosis not present

## 2024-05-17 DIAGNOSIS — M810 Age-related osteoporosis without current pathological fracture: Secondary | ICD-10-CM | POA: Diagnosis not present

## 2024-05-17 DIAGNOSIS — Z780 Asymptomatic menopausal state: Secondary | ICD-10-CM | POA: Diagnosis not present

## 2024-05-17 DIAGNOSIS — Z13820 Encounter for screening for osteoporosis: Secondary | ICD-10-CM | POA: Insufficient documentation

## 2024-05-17 DIAGNOSIS — E038 Other specified hypothyroidism: Secondary | ICD-10-CM | POA: Diagnosis not present

## 2024-05-17 DIAGNOSIS — E7849 Other hyperlipidemia: Secondary | ICD-10-CM | POA: Diagnosis not present

## 2024-05-17 DIAGNOSIS — Z79899 Other long term (current) drug therapy: Secondary | ICD-10-CM | POA: Diagnosis not present

## 2024-05-17 DIAGNOSIS — Z1231 Encounter for screening mammogram for malignant neoplasm of breast: Secondary | ICD-10-CM | POA: Diagnosis not present

## 2024-05-18 ENCOUNTER — Ambulatory Visit: Payer: Self-pay | Admitting: Family Medicine

## 2024-05-18 LAB — BASIC METABOLIC PANEL WITH GFR
BUN/Creatinine Ratio: 23 (ref 12–28)
BUN: 18 mg/dL (ref 8–27)
CO2: 19 mmol/L — ABNORMAL LOW (ref 20–29)
Calcium: 9.9 mg/dL (ref 8.7–10.3)
Chloride: 99 mmol/L (ref 96–106)
Creatinine, Ser: 0.77 mg/dL (ref 0.57–1.00)
Glucose: 86 mg/dL (ref 70–99)
Potassium: 4.5 mmol/L (ref 3.5–5.2)
Sodium: 138 mmol/L (ref 134–144)
eGFR: 81 mL/min/{1.73_m2} (ref >59)

## 2024-05-18 LAB — HEPATIC FUNCTION PANEL
ALT: 16 IU/L (ref 0–32)
AST: 23 IU/L (ref 0–40)
Albumin: 4.6 g/dL (ref 3.8–4.8)
Alkaline Phosphatase: 70 IU/L (ref 44–121)
Bilirubin Total: <0.2 mg/dL (ref 0.0–1.2)
Bilirubin, Direct: 0.07 mg/dL (ref 0.00–0.40)
Total Protein: 7.1 g/dL (ref 6.0–8.5)

## 2024-05-18 LAB — HEMOGLOBIN A1C
Est. average glucose Bld gHb Est-mCnc: 100 mg/dL
Hgb A1c MFr Bld: 5.1 % (ref 4.8–5.6)

## 2024-05-18 LAB — TSH+FREE T4
Free T4: 1.38 ng/dL (ref 0.82–1.77)
TSH: 1.65 u[IU]/mL (ref 0.450–4.500)

## 2024-05-18 LAB — LIPID PANEL
Chol/HDL Ratio: 3.5 ratio (ref 0.0–4.4)
Cholesterol, Total: 193 mg/dL (ref 100–199)
HDL: 55 mg/dL (ref >39)
LDL Chol Calc (NIH): 118 mg/dL — ABNORMAL HIGH (ref 0–99)
Triglycerides: 109 mg/dL (ref 0–149)
VLDL Cholesterol Cal: 20 mg/dL (ref 5–40)

## 2024-05-20 ENCOUNTER — Encounter: Payer: Self-pay | Admitting: Family Medicine

## 2024-05-20 ENCOUNTER — Ambulatory Visit: Admitting: Family Medicine

## 2024-05-20 VITALS — BP 110/75 | Temp 97.7°F | Ht 67.0 in | Wt 172.0 lb

## 2024-05-20 DIAGNOSIS — M816 Localized osteoporosis [Lequesne]: Secondary | ICD-10-CM

## 2024-05-20 DIAGNOSIS — E7849 Other hyperlipidemia: Secondary | ICD-10-CM | POA: Diagnosis not present

## 2024-05-20 DIAGNOSIS — Z79891 Long term (current) use of opiate analgesic: Secondary | ICD-10-CM

## 2024-05-20 DIAGNOSIS — F411 Generalized anxiety disorder: Secondary | ICD-10-CM

## 2024-05-20 DIAGNOSIS — I10 Essential (primary) hypertension: Secondary | ICD-10-CM | POA: Diagnosis not present

## 2024-05-20 DIAGNOSIS — Z5941 Food insecurity: Secondary | ICD-10-CM | POA: Diagnosis not present

## 2024-05-20 DIAGNOSIS — Z79899 Other long term (current) drug therapy: Secondary | ICD-10-CM

## 2024-05-20 MED ORDER — PRAVASTATIN SODIUM 40 MG PO TABS: 40.0000 mg | ORAL_TABLET | Freq: Every day | ORAL | 1 refills | Status: DC

## 2024-05-20 MED ORDER — OXYCODONE HCL 10 MG PO TABS: ORAL_TABLET | ORAL | 0 refills | Status: DC

## 2024-05-20 MED ORDER — FAMOTIDINE 20 MG PO TABS: 20.0000 mg | ORAL_TABLET | Freq: Two times a day (BID) | ORAL | 1 refills | Status: DC | PRN

## 2024-05-20 MED ORDER — POTASSIUM CHLORIDE ER 10 MEQ PO TBCR: EXTENDED_RELEASE_TABLET | ORAL | 1 refills | Status: DC

## 2024-05-20 MED ORDER — LEVOTHYROXINE SODIUM 25 MCG PO TABS: ORAL_TABLET | ORAL | 1 refills | Status: DC

## 2024-05-20 MED ORDER — NORTRIPTYLINE HCL 10 MG PO CAPS: ORAL_CAPSULE | ORAL | 0 refills | Status: AC

## 2024-05-20 MED ORDER — PANTOPRAZOLE SODIUM 40 MG PO TBEC: 40.0000 mg | DELAYED_RELEASE_TABLET | Freq: Every day | ORAL | 1 refills | Status: DC

## 2024-05-20 MED ORDER — LISINOPRIL 2.5 MG PO TABS: ORAL_TABLET | ORAL | 1 refills | Status: DC

## 2024-05-20 MED ORDER — HYDROCHLOROTHIAZIDE 25 MG PO TABS: ORAL_TABLET | ORAL | 1 refills | Status: DC

## 2024-05-20 MED ORDER — ALPRAZOLAM 0.5 MG PO TABS: ORAL_TABLET | ORAL | 5 refills | Status: DC

## 2024-05-20 NOTE — Progress Notes (Signed)
 Subjective:    Patient ID: Amanda Osborne, female    DOB: [birth year], 73 y.o.   MRN: [id number]  HPI  3 month follow up - pain management- no concerns  .  Nice patient Having difficult time affording it her basic cost Patient at time for goes to medical treatments because of lack of funds She is under a lot of stress but denies being depressed Deals with a lot of anxiousness takes her alprazolam  a couple times a day She does take her pain medicine typically 3 times per day Does keep her pain under good control.bvpa This patient was seen today for chronic pain  The medication list was reviewed and updated.   Location of Pain for which the patient has been treated with regarding narcotics: Cervical pain with radiculopathy  Onset of this pain: Present for years   -Compliance with medication: Good compliance  - Number patient states they take daily: Maximum 3/day  -Reason for ongoing use of opioids does not get adequate relief Tylenol   What other measures have been tried outside of opioids Tylenol ,  In the ongoing specialists regarding this condition none currently  -when was the last dose patient took?  Past 24 hours  The patient was advised the importance of maintaining medication and not using illegal substances with these.  Here for refills and follow up  The patient was educated that we can provide 3 monthly scripts for their medication, it is their responsibility to follow the instructions.  Side effects or complications from medications: Will be due later this year for UDT no side effects  Patient is aware that pain medications are meant to minimize the severity of the pain to allow their pain levels to improve to allow for better function. They are aware of that pain medications cannot totally remove their pain.  Due for UDT ( at least once per year) (pain management contract is also completed at the time of the UDT): See above  Scale of 1 to 10 ( 1 is least 10  is most) Your pain level without the medicine: 8 Your pain level with medication 4  Scale 1 to 10 ( 1-helps very little, 10 helps very well) How well does your pain medication reduce your pain so you can function better through out the day?  7  Quality of the pain: Throbbing aching  Persistence of the pain: Present all time  Modifying factors: Worse with activity  Discussed the use of AI scribe software for clinical note transcription with the patient, who gave verbal consent to proceed.  History of Present Illness   Amanda Osborne Cecile Gillispie is a 73 year old female who presents with fatigue and difficulty breathing.  She experiences persistent fatigue and difficulty breathing, particularly in high humidity, which impacts her ability to perform daily activities. Her stress levels are high due to various worries, which she feels unable to improve.  She has a history of iron deficiency and is unable to take iron supplements regularly due to stomach discomfort, even with specially coated tablets. This may contribute to her fatigue and low energy levels.  She takes cholesterol and thyroid  medications regularly but is unsure about the availability of refills. She takes pain medication approximately three times a day by cutting the pills in half. She also takes alprazolam  twice a day but has experienced issues with refills, causing a delay in obtaining her medication.  She describes pain as a constant issue, making it difficult to manage daily tasks,  such as going to the pharmacy. She expresses dread about traveling to [facility] for a dental appointment, which affects her sleep.  Her diet primarily consists of tomato sandwiches, ramen noodles, and occasionally hamburger for protein. She used to consume more chicken but finds it unaffordable and of poor quality now.  She has a son who recently moved nearby and occasionally helps with groceries. Most of her family has passed away, and she has  some cousins who smoke heavily, which she finds concerning.         Review of Systems     Objective:   Physical Exam  General-in no acute distress Eyes-no discharge Lungs-respiratory rate normal, CTA CV-no murmurs,RRR Extremities skin warm dry no edema Neuro grossly normal Behavior normal, alert       Assessment & Plan:  Assessment and Plan    Fatigue Persistent fatigue potentially related to iron deficiency. Unable to tolerate regular iron supplements due to gastrointestinal discomfort.  Iron Deficiency Iron deficiency with intolerance to oral supplements due to gastrointestinal discomfort.  Osteoporosis Bone density results confirm osteoporosis. Esophagitis with oral bisphosphonates makes her unsuitable. Discussed IV Boniva or Prolia shots to strengthen bones and reduce fracture risk. Cost is a concern. - Discuss possibility of IV Boniva or Prolia shots for osteoporosis management. - Investigate out-of-pocket costs for Prolia or Boniva infusions and discuss with her.  Hyperlipidemia LDL cholesterol is above target of 100 mg/dL. Discussed increasing cholesterol medication to better manage LDL levels and reduce cardiovascular risk. - Increase cholesterol medication dosage from 20 mg to 40 mg if she agrees and the cost is manageable.  General Health Maintenance Blood work shows good A1c, thyroid  function, kidney function, and electrolyte levels. Awaiting mammogram results. - Follow up on mammogram results once available.      1. Generalized anxiety disorder (Primary) May continue Xanax  twice a day does not get drowsy with the medicine  2. HTN (hypertension), benign Blood pressure good control continue current measures  3. Other hyperlipidemia Bump up dose of statin to get LDL under better control  4. Encounter for long-term opiate analgesic use The patient was seen in followup for chronic pain. A review over at their current pain status was discussed. Drug  registry was checked. Prescriptions were given.  Regular follow-up recommended. Discussion was held regarding the importance of compliance with medication as well as pain medication contract.  Patient was informed that medication may cause drowsiness and should not be combined  with other medications/alcohol or street drugs. If the patient feels medication is causing altered alertness then do not drive or operate dangerous equipment.  Should be noted that the patient appears to be meeting appropriate use of opioids and response.  Evidenced by improved function and decent pain control without significant side effects and no evidence of overt aberrancy issues.  Upon discussion with the patient today they understand that opioid therapy is optional and they feel that the pain has been refractory to reasonable conservative measures and is significant and affecting quality of life enough to warrant ongoing therapy and wishes to continue opioids.  Refills were provided.    medical Board guidelines regarding the pain medicine has been reviewed.  CDC guidelines most updated 2022 has been reviewed by the prescriber.  PDMP is checked on a regular basis yearly urine drug screen and pain management contract  Treatment plan for this patient includes #1-gentle stretching exercises as shown daily basis 2.  Mild strength exercises 3 times per week #3 continue pain medications #  4 notify us  if any digression   5. High risk medication use Tolerating medicines well  6. Food insecurity Will make referral to social worker to see if we get extra help for her  7. Localized osteoporosis without current pathological fracture Will try to see if clinical pharmacy could estimate the cost of Reclast or Prolia because she cannot tolerate oral biphosphonate's-heartburn Follow-up 3 months

## 2024-05-21 ENCOUNTER — Other Ambulatory Visit: Payer: Self-pay

## 2024-05-21 DIAGNOSIS — Z5941 Food insecurity: Secondary | ICD-10-CM

## 2024-06-02 ENCOUNTER — Encounter: Payer: Self-pay | Admitting: Family Medicine

## 2024-06-18 ENCOUNTER — Telehealth: Payer: Self-pay

## 2024-06-18 NOTE — Progress Notes (Signed)
 Complex Care Management Note Care Guide Note  06/18/2024 Name: Amanda Osborne MRN: [id number] DOB: October 30, 1951   Complex Care Management Outreach Attempts: An unsuccessful telephone outreach was attempted today to offer the patient information about available complex care management services.  Follow Up Plan:  Additional outreach attempts will be made to offer the patient complex care management information and services.   Encounter Outcome:  No Answer  Jeoffrey Buffalo , RMA     [place]  [facility], [facility] Guide  Direct Dial: [phone]  Website: [email or web address]

## 2024-06-19 NOTE — Progress Notes (Signed)
 Complex Care Management Note Care Guide Note  06/19/2024 Name: Amanda Osborne MRN: [id number] DOB: [birth year]   Complex Care Management Outreach Attempts: A second unsuccessful outreach was attempted today to offer the patient with information about available complex care management services.  Follow Up Plan:  Additional outreach attempts will be made to offer the patient complex care management information and services.   Encounter Outcome:  No Answer  Jeoffrey Buffalo , RMA     [place]  [facility], [facility] Guide  Direct Dial: [phone]  Website: [email or web address]

## 2024-06-25 NOTE — Progress Notes (Signed)
 Complex Care Management Note Care Guide Note  06/25/2024 Name: Amanda Osborne MRN: [id number] DOB: 09-Feb-1951   Complex Care Management Outreach Attempts: A third unsuccessful outreach was attempted today to offer the patient with information about available complex care management services.  Follow Up Plan:  No further outreach attempts will be made at this time. We have been unable to contact the patient to offer or enroll patient in complex care management services.  Encounter Outcome:  No Answer  Jeoffrey Buffalo , RMA     [place]  [facility], [facility] Guide  Direct Dial: [phone]  Website: [email or web address]

## 2024-07-11 ENCOUNTER — Other Ambulatory Visit: Payer: Self-pay | Admitting: Licensed Clinical Social Worker

## 2024-07-11 DIAGNOSIS — I10 Essential (primary) hypertension: Secondary | ICD-10-CM

## 2024-07-12 ENCOUNTER — Telehealth: Payer: Self-pay

## 2024-07-12 NOTE — Progress Notes (Signed)
 Care Guide Pharmacy Note  07/12/2024 Name: Amanda Osborne MRN: [id number] DOB: [birth year]  Referred By: Alphonsa Glendia LABOR, MD Reason for referral: Complex Care Management (Outreach to schedule with Pharm d )   Amanda Osborne is a 73 y.o. year old female who is a primary care patient of Luking, Glendia LABOR, MD.  Amanda Osborne was referred to the pharmacist for assistance related to: HTN  An unsuccessful telephone outreach was attempted today to contact the patient who was referred to the pharmacy team for assistance with medication assistance. Additional attempts will be made to contact the patient.  Jeoffrey Buffalo , RMA     [facility]  [facility], [facility] Guide  Direct Dial: [phone]  Website: [email or web address]

## 2024-07-13 NOTE — Patient Instructions (Signed)
 Visit Information  Thank you for taking time to visit with me today. Please don't hesitate to contact me if I can be of assistance to you before our next scheduled appointment.  Our next appointment is by telephone on 9/10 at 230 Please call the care guide team at [phone] if you need to cancel or reschedule your appointment.   Following is a copy of your care plan:   Goals Addressed             This Visit's Progress    LCSW VBCI Social Work Care Plan       Barriers:  Acute Mental Health needs related to  anxiety  Financial constraints related to managing health care expenses ($30 co-pay for therapy) Limited social support - Patient stated she has family members who live close and they say they will help her Lacks knowledge of community resource: Patient does not know where she can receive virtual outpatient therapy and medication management without a copay Suicidal Ideation/Homicidal Ideation: No  Chronic Pain  Clinical Social Work Goal(s):  Over the next 90 days, patient will work with SW monthly by telephone or in person to reduce or manage symptoms related to anxiety Over the next 30 days, patient will work with SW to address concerns related to isolation and lack of community support   Interventions: Patient interviewed and appropriate assessments performed Provided mental health counseling with regard to anxiety management Discussed plans with patient for ongoing care management follow up and provided patient with direct contact information for care management team Emotional/Supportive Counseling Patient denied therapy referral at this time  Patient Self Care Activities:  Self administers medications as prescribed Attends all scheduled provider appointments Performs ADL's independently Performs IADL's independently Ability for insight Independent living Motivation for treatment  Patient Coping Strengths:  Hopefulness Self Advocate Able to Communicate  Effectively  Patient Self Care Deficits:  Lacks social connections        Please call the Suicide and Crisis Lifeline: 988 call the [facility]: [phone] if you are experiencing a Mental Health or Behavioral Health Crisis or need someone to talk to.  Lyle Rung, BSW, MSW, LCSW Licensed Clinical Social Worker [employer]   [email or web address] Direct Dial: [phone]

## 2024-07-13 NOTE — Patient Outreach (Signed)
 Complex Care Management   Visit Note  07/11/2024  Name:  Amanda Osborne MRN: [id number] DOB: 1950/12/31  Situation: Referral received for Complex Care Management related to Mental/Behavioral Health diagnosis anxiety/depressive symptoms. I obtained verbal consent from Patient.  Visit completed with Patient  on the phone  Background:   Past Medical History:  Diagnosis Date   Anxiety    Arthritis    Cervical disc herniation    c5 and c6   Cervicogenic headache 05/13/2019   Chronic pain    Complication of anesthesia    Fibromyalgia    GERD (gastroesophageal reflux disease)    Headache(784.0)    Hx: of migraines until age 73   Hyperlipidemia    Hypertension    Iron deficiency anemia 11/30/2012   Left carpal tunnel syndrome 11/07/2018   Narcolepsy    Occipital neuralgia    PONV (postoperative nausea and vomiting)    DURING FIRST NECK SURGERY ; BUT REPORT IT MAY HAVE BEEN THE DILAUDID  THAT CAUSED THE VOITING I   Unspecified vitamin D  deficiency 03/26/2013   Wrist fracture, bilateral 2005 and 2010   right then left   There were no vitals filed for this visit.  Medications Reviewed Today     Reviewed by Merlynn Lyle CROME, LCSW (Social Worker) on 07/11/24 at 1458  Med List Status: <None>   Medication Order Taking? Sig Documenting Provider Last Dose Status Informant  ALPRAZolam  (XANAX ) 0.5 MG tablet [id number]  TAKE 1 TABLET BY MOUTH TWICE DAILY AS NEEDED FOR ANXIETY Luking, Scott A, MD  Active   famotidine  (PEPCID ) 20 MG tablet [id number]  Take 1 tablet (20 mg total) by mouth 2 (two) times daily as needed for heartburn or indigestion. Alphonsa Glendia LABOR, MD  Active   fluticasone  (FLONASE ) 50 MCG/ACT nasal spray [id number]  INSTILL 2 SPRAYS INTO EACH NOSTRIL ONCE DAILY Luking, Glendia LABOR, MD  Active   hydrochlorothiazide  (HYDRODIURIL ) 25 MG tablet [id number]  TAKE 1 TABLET(25 MG) BY MOUTH DAILY Luking, Scott A, MD  Active   ibuprofen  (ADVIL ) 600 MG tablet [id number] No Take 1 tablet (600 mg  total) by mouth every 8 (eight) hours as needed. Alphonsa Glendia LABOR, MD Taking Active   Iron-Vitamin C (VITRON-C PO) [id number] No Take 65 mg by mouth 2 (two) times daily. [provider] Taking Active Self  levothyroxine  (SYNTHROID ) 25 MCG tablet [id number]  TAKE 4 TABLETS BY MOUTH EVERY SATURDAY AND 5 TABLETS EVERY WEDNESDAY Luking, Scott A, MD  Active   lisinopril  (ZESTRIL ) 2.5 MG tablet [id number]  1 qd Luking, Scott A, MD  Active   nortriptyline  (PAMELOR ) 10 MG capsule [id number]  1 to 2 qhs prn as directed Alphonsa Glendia LABOR, MD  Active   Oxycodone  HCl 10 MG TABS [id number]  One tid prn pain Alphonsa Glendia LABOR, MD  Active   Oxycodone  HCl 10 MG TABS [id number]  One tid prn pain Alphonsa Glendia LABOR, MD  Active   Oxycodone  HCl 10 MG TABS [id number]  1 tid prn pain Luking, Glendia LABOR, MD  Active   pantoprazole  (PROTONIX ) 40 MG tablet [id number]  Take 1 tablet (40 mg total) by mouth daily. Alphonsa Glendia LABOR, MD  Active   polyethylene glycol powder (GLYCOLAX /MIRALAX ) powder [id number] No Take 17 g by mouth daily as needed (for constipation). [provider] Taking Active Self           Med Note RANDENE LAURANN JONELLE Pablo Aug 08, 2022  1:11 PM) prn  potassium chloride  (KLOR-CON ) 10  MEQ tablet [id number]  TAKE 1 TABLET(10 MEQ) BY MOUTH DAILY Luking, Scott A, MD  Active   pravastatin  (PRAVACHOL ) 40 MG tablet [id number]  Take 1 tablet (40 mg total) by mouth daily. Alphonsa Glendia LABOR, MD  Active             Recommendation:   PCP Follow-up Continue Current Plan of Care  Follow Up Plan:   Telephone follow-up in 1 month  Lyle Rung, BSW, MSW, LCSW Licensed Clinical Social Worker [employer]   [email or web address] Direct Dial: [phone]

## 2024-07-15 ENCOUNTER — Other Ambulatory Visit: Payer: Self-pay

## 2024-07-15 NOTE — Patient Outreach (Signed)
 Complex Care Management   Visit Note  07/15/2024  Name:  Amanda Osborne MRN: [id number] DOB: Feb 20, 1951  Situation: Referral received for Complex Care Management related to SDOH Barriers:  Food insecurity Financial Resource Strain I obtained verbal consent from Patient.  Visit completed with Patient  on the phone  Background:   Past Medical History:  Diagnosis Date   Anxiety    Arthritis    Cervical disc herniation    c5 and c6   Cervicogenic headache 05/13/2019   Chronic pain    Complication of anesthesia    Fibromyalgia    GERD (gastroesophageal reflux disease)    Headache(784.0)    Hx: of migraines until age 38's   Hyperlipidemia    Hypertension    Iron deficiency anemia 11/30/2012   Left carpal tunnel syndrome 11/07/2018   Narcolepsy    Occipital neuralgia    PONV (postoperative nausea and vomiting)    DURING FIRST NECK SURGERY ; BUT REPORT IT MAY HAVE BEEN THE DILAUDID  THAT CAUSED THE VOITING I   Unspecified vitamin D  deficiency 03/26/2013   Wrist fracture, bilateral 2005 and 2010   right then left    Assessment:  Patient has a fixed income and unable to pay $250 copay for glasses with her insurance. Patient reports no matter which provider she picks, the copay is the same.  SW does not have additional resources to pay for glasses. Patient is emailed a food bank list to assist with food. Foodstamps was only $16 and patient did not recertify. SW informed patient of the Energy Assistance Program with DSS to apply in December. Patient son lives in [place] and may assist if needed.   SDOH Interventions    Flowsheet Row Patient Outreach Telephone from 07/15/2024 in [place] POPULATION HEALTH DEPARTMENT Patient Outreach Telephone from 07/11/2024 in [place] POPULATION HEALTH DEPARTMENT Clinical Support from 01/12/2024 in [facility] Care Coordination from 05/19/2023 in [facility] Community Care Coordination Clinical Support from  08/31/2022 in [facility] Clinical Support from 08/14/2021 in [facility]  SDOH Interventions        Food Insecurity Interventions Other (Comment)  [Food bank list] Community Resources Provided, [employer] Resources Referral Intervention Not Indicated [id number] Referral Intervention Not Indicated Intervention Not Indicated  Housing Interventions Intervention Not Indicated Intervention Not Indicated Intervention Not Indicated Intervention Not Indicated Intervention Not Indicated Intervention Not Indicated  Transportation Interventions Intervention Not Indicated  [Has a car] -- Intervention Not Indicated Intervention Not Indicated Intervention Not Indicated Intervention Not Indicated  Utilities Interventions Intervention Not Indicated -- Intervention Not Indicated -- Intervention Not Indicated --  Alcohol Usage Interventions -- -- Intervention Not Indicated (Score <7) -- Intervention Not Indicated (Score <7) --  Depression Interventions/Treatment  -- -- Patient refuses Treatment -- -- --  Financial Strain Interventions Intervention Not Indicated  [Looking for a job and son may be able help] [employer] Provided, Other (Comment)  [BSW referral] Patient Declined -- Intervention Not Indicated Intervention Not Indicated  Physical Activity Interventions -- -- Other (Comments)  [pt does not have an exercise program but does things around the house] -- Intervention Not Indicated Intervention Not Indicated  Stress Interventions -- -- Intervention Not Indicated -- Intervention Not Indicated Patient Refused  Social Connections Interventions -- -- Patient Declined -- Intervention Not Indicated Intervention Not Indicated  Health Literacy Interventions -- -- Intervention Not Indicated -- -- --      Recommendation:   None  Follow  Up Plan:   Patient has met all care management goals. Care Management case will be closed. Patient has been provided contact  information should new needs arise.   Tillman Gardener, BSW [place]  [facility], [facility] Social Worker Direct Dial: [phone]  Fax: [phone] Website: [email or web address]

## 2024-07-15 NOTE — Patient Instructions (Signed)
 Visit Information  Thank you for taking time to visit with me today. Please don't hesitate to contact me if I can be of assistance to you before our next scheduled appointment.   Following is a copy of your care plan:   Goals Addressed             This Visit's Progress    BSW VBCI Social Work Care Plan       Problems:   Air traffic controller Insecurity   CSW Clinical Goal(s):   Over the next 7 days the Patient will will follow up with food banks as directed by Social Work.  Interventions:  Social Determinants of Health in Patient with HTN: SDOH assessments completed: Financial Strain  and Food Insecurity  Evaluation of current treatment plan related to unmet needs Patient has a fixed income and unable to pay $250 copay for glasses with her insurance.  SW does not have additional resources to pay for glasses. Patient is provided a food bank list to assist with food.  Foodstamps was only $16 and patient did not recertify.  SW informed patient of the Energy Assistance Program with DSS to apply in December.  Patient son lives in [place] and may assist if needed.  Patient Goals/Self-Care Activities:  Follow up with food banks for community food options.  Plan:   No further follow up required: Patient does not request a follow up visit.        Please call 911 if you are experiencing a Mental Health or Behavioral Health Crisis or need someone to talk to.  Patient verbalizes understanding of instructions and care plan provided today and agrees to view in MyChart. Active MyChart status and patient understanding of how to access instructions and care plan via MyChart confirmed with patient.     Tillman Gardener, BSW [place]  [facility], [facility] Social Worker Direct Dial: [phone]  Fax: [phone] Website: [email or web address]

## 2024-07-17 NOTE — Progress Notes (Signed)
 Care Guide Pharmacy Note  07/17/2024 Name: Amanda Osborne MRN: [id number] DOB: 18-Sep-1951  Referred By: Alphonsa Glendia LABOR, MD Reason for referral: Complex Care Management (Outreach to schedule with Pharm d )   Amanda Osborne is a 73 y.o. year old female who is a primary care patient of Luking, Glendia LABOR, MD.  Amanda Osborne was referred to the pharmacist for assistance related to: HTN  A second unsuccessful telephone outreach was attempted today to contact the patient who was referred to the pharmacy team for assistance with medication assistance. Additional attempts will be made to contact the patient.  Jeoffrey Buffalo , RMA     [facility]  [facility], [facility] Guide  Direct Dial: [phone]  Website: [email or web address]

## 2024-07-18 ENCOUNTER — Other Ambulatory Visit: Payer: Self-pay | Admitting: Family Medicine

## 2024-07-31 ENCOUNTER — Other Ambulatory Visit: Payer: Self-pay | Admitting: Licensed Clinical Social Worker

## 2024-07-31 NOTE — Patient Instructions (Signed)
 Winton JONETTA Server ,   [facility] Care Team is available to provide assistance to you with your healthcare needs at no cost and as a benefit of your Health plan. I'm sorry I was unable to reach you today for our scheduled appointment. I was unable to leave you a voice message today as your mailbox is full. Please call me at the number below. I am available to be of assistance to you regarding your healthcare needs. .   Thank you,   Lyle Rung, BSW, MSW, LCSW Licensed Clinical Social Worker [employer]   [facility] [email or web address] Direct Dial: [phone]

## 2024-08-08 ENCOUNTER — Other Ambulatory Visit: Payer: Self-pay | Admitting: Licensed Clinical Social Worker

## 2024-08-08 NOTE — Patient Instructions (Signed)
 Winton JONETTA Server - I am sorry I was unable to reach you today for our scheduled appointment. I work with Alphonsa Glendia LABOR, MD and am calling to support your healthcare needs. Please contact me at [phone] at your earliest convenience. I look forward to speaking with you soon.   Thank you,  Lyle Rung, BSW, MSW, LCSW Licensed Clinical Social Worker [employer]   [facility] [email or web address] Direct Dial: [phone]

## 2024-08-22 ENCOUNTER — Other Ambulatory Visit: Payer: Self-pay | Admitting: Licensed Clinical Social Worker

## 2024-08-22 NOTE — Patient Instructions (Signed)
 Visit Information  Thank you for taking time to visit with me today. Please don't hesitate to contact me if I can be of assistance to you before our next scheduled appointment.  Your next care management appointment is by telephone on 09/23/24 at 11 am  Referral to RN Case Manager.  Please call the care guide team at [phone] if you need to cancel, schedule, or reschedule an appointment.   Please call the Suicide and Crisis Lifeline: 988 call the [place]  National Suicide Prevention Lifeline: 501-430-6476 or TTY: 901-406-5561 TTY 302-042-4111) to talk to a trained counselor call 1-800-273-TALK (toll free, 24 hour hotline) go to [facility] [address], [place] [phone]) call the [facility] Crisis Line: 8507193397 call 911 if you are experiencing a Mental Health or Behavioral Health Crisis or need someone to talk to.  Lyle Rung, BSW, MSW, LCSW Licensed Clinical Social Worker [employer]   [email or web address] Direct Dial: [phone]

## 2024-08-22 NOTE — Patient Outreach (Addendum)
 Complex Care Management   Visit Note  08/22/2024  Name:  Amanda Osborne MRN: [id number] DOB: 11-Jun-1951  Situation: Referral received for Complex Care Management related to Mental/Behavioral Health diagnosis anxiety and depression. I obtained verbal consent from Patient.  Visit completed with Patient  on the phone  Background:   Past Medical History:  Diagnosis Date   Anxiety    Arthritis    Cervical disc herniation    c5 and c6   Cervicogenic headache 05/13/2019   Chronic pain    Complication of anesthesia    Fibromyalgia    GERD (gastroesophageal reflux disease)    Headache(784.0)    Hx: of migraines until age 58's   Hyperlipidemia    Hypertension    Iron deficiency anemia 11/30/2012   Left carpal tunnel syndrome 11/07/2018   Narcolepsy    Occipital neuralgia    PONV (postoperative nausea and vomiting)    DURING FIRST NECK SURGERY ; BUT REPORT IT MAY HAVE BEEN THE DILAUDID  THAT CAUSED THE VOITING I   Unspecified vitamin D  deficiency 03/26/2013   Wrist fracture, bilateral 2005 and 2010   right then left    Assessment: Patient Reported Symptoms:  Cognitive Cognitive Status: Able to follow simple commands, Alert and oriented to person, place, and time Cognitive/Intellectual Conditions Management [RPT]: None reported or documented in medical history or problem list   Health Maintenance Behaviors: Annual physical exam, Stress management Healing Pattern: Average Health Facilitated by: Rest, Stress management  Neurological Neurological Review of Symptoms: No symptoms reported Neurological Management Strategies: Routine screening, Adequate rest, Coping strategies  HEENT HEENT Symptoms Reported: No symptoms reported      Cardiovascular Cardiovascular Symptoms Reported: No symptoms reported Does patient have uncontrolled Hypertension?: Yes Cardiovascular Management Strategies: Medication therapy, Coping strategies Cardiovascular Self-Management Outcome: 3 (uncertain)   Respiratory Respiratory Symptoms Reported: No symptoms reported    Gastrointestinal Gastrointestinal Symptoms Reported: Reflux/heartburn Gastrointestinal Management Strategies: Medication therapy Gastrointestinal Self-Management Outcome: 2 (bad)    Genitourinary Genitourinary Symptoms Reported: No symptoms reported    Integumentary Integumentary Symptoms Reported: No symptoms reported    Musculoskeletal Musculoskelatal Symptoms Reviewed: Weakness Musculoskeletal Management Strategies: Adequate rest, Routine screening Musculoskeletal Self-Management Outcome: 4 (good)      Psychosocial Psychosocial Symptoms Reported: Sadness - if selected complete PHQ 2-9 Behavioral Management Strategies: Medication therapy, Coping strategies Behavioral Health Self-Management Outcome: 3 (uncertain) Major Change/Loss/Stressor/Fears (CP): Medical condition, self Techniques to Cope with Loss/Stress/Change: Medication Quality of Family Relationships: involved Do you feel physically threatened by others?: No Continues to decline need for [facility] support at this time (reports her stress is caused by financial strain and her health concerns.     08/22/2024    PHQ2-9 Depression Screening   Little interest or pleasure in doing things Several days  Feeling down, depressed, or hopeless Several days  PHQ-2 - Total Score 2  Trouble falling or staying asleep, or sleeping too much Not at all  Feeling tired or having little energy Nearly every day  Poor appetite or overeating     Feeling bad about yourself - or that you are a failure or have let yourself or your family down Not at all  Trouble concentrating on things, such as reading the newspaper or watching television Not at all  Moving or speaking so slowly that other people could have noticed.  Or the opposite - being so fidgety or restless that you have been moving around a lot more than usual Not at all  Thoughts that you would be  better off dead, or hurting  yourself in some way    PHQ2-9 Total Score 5  If you checked off any problems, how difficult have these problems made it for you to do your work, take care of things at home, or get along with other people Not difficult at all  Depression Interventions/Treatment Medication      05/20/2024    2:20 PM 11/09/2023    2:16 PM 08/10/2023    1:59 PM 02/08/2023    2:50 PM  GAD 7 : Generalized Anxiety Score  Nervous, Anxious, on Edge 1 1 2  0  Control/stop worrying 1 1 1  0  Worry too much - different things 1 1 1 1   Trouble relaxing 1 1 2  0  Restless 0 0 0 0  Easily annoyed or irritable 1 1 0 1  Afraid - awful might happen 0 1 0 0  Total GAD 7 Score 5 6 6 2   Anxiety Difficulty Somewhat difficult Somewhat difficult Somewhat difficult Not difficult at all   There were no vitals filed for this visit.  Medications Reviewed Today     Reviewed by Merlynn Lyle CROME, LCSW (Social Worker) on 08/22/24 at 1535  Med List Status: <None>   Medication Order Taking? Sig Documenting Provider Last Dose Status Informant  ALPRAZolam  (XANAX ) 0.5 MG tablet [id number]  TAKE 1 TABLET BY MOUTH TWICE DAILY AS NEEDED FOR ANXIETY Luking, Scott A, MD  Active   famotidine  (PEPCID ) 20 MG tablet [id number]  Take 1 tablet (20 mg total) by mouth 2 (two) times daily as needed for heartburn or indigestion. Alphonsa Glendia LABOR, MD  Active   fluticasone  (FLONASE ) 50 MCG/ACT nasal spray [id number]  INSTILL 2 SPRAYS INTO EACH NOSTRIL ONCE DAILY Alphonsa Glendia LABOR, MD  Active   hydrochlorothiazide  (HYDRODIURIL ) 25 MG tablet [id number]  TAKE 1 TABLET(25 MG) BY MOUTH DAILY Alphonsa, Scott A, MD  Active   ibuprofen  (ADVIL ) 600 MG tablet [id number]  TAKE 1 TABLET(600 MG) BY MOUTH EVERY 8 HOURS AS NEEDED Luking, Glendia LABOR, MD  Active   Iron-Vitamin C (VITRON-C PO) [id number] No Take 65 mg by mouth 2 (two) times daily. [provider] Taking Active Self  levothyroxine  (SYNTHROID ) 25 MCG tablet [id number]  TAKE 4 TABLETS BY MOUTH EVERY SATURDAY AND 5  TABLETS EVERY WEDNESDAY Luking, Scott A, MD  Active   lisinopril  (ZESTRIL ) 2.5 MG tablet [id number]  1 qd Alphonsa Glendia A, MD  Active   nortriptyline  (PAMELOR ) 10 MG capsule [id number]  1 to 2 qhs prn as directed Alphonsa Glendia LABOR, MD  Active   Oxycodone  HCl 10 MG TABS [id number]  One tid prn pain Alphonsa Glendia LABOR, MD  Active   Oxycodone  HCl 10 MG TABS [id number]  One tid prn pain Alphonsa Glendia LABOR, MD  Active   Oxycodone  HCl 10 MG TABS [id number]  1 tid prn pain Alphonsa Glendia LABOR, MD  Active   pantoprazole  (PROTONIX ) 40 MG tablet [id number]  Take 1 tablet (40 mg total) by mouth daily. Alphonsa Glendia LABOR, MD  Active   polyethylene glycol powder (GLYCOLAX /MIRALAX ) powder [id number] No Take 17 g by mouth daily as needed (for constipation). [provider] Taking Active Self           Med Note RANDENE LAURANN JONELLE Pablo Aug 08, 2022  1:11 PM) prn  potassium chloride  (KLOR-CON ) 10 MEQ tablet [id number]  TAKE 1 TABLET(10 MEQ) BY MOUTH DAILY Luking, Scott A, MD  Active   pravastatin  (PRAVACHOL ) 40 MG tablet  [id number]  Take 1 tablet (40 mg total) by mouth daily. Alphonsa Glendia LABOR, MD  Active             Recommendation:   PCP Follow-up Continue Current Plan of Care Let PCP know about medication concerns Consider talk therapy referral  Follow Up Plan:   Referral to RN Case Manager  Lyle Rung, BSW, MSW, LCSW Licensed Clinical Social Worker [employer]   [email or web address] Direct Dial: [phone]

## 2024-09-04 ENCOUNTER — Encounter (INDEPENDENT_AMBULATORY_CARE_PROVIDER_SITE_OTHER): Payer: Self-pay | Admitting: Gastroenterology

## 2024-09-05 ENCOUNTER — Other Ambulatory Visit: Payer: Self-pay | Admitting: Family Medicine

## 2024-09-05 ENCOUNTER — Encounter: Payer: Self-pay | Admitting: Family Medicine

## 2024-09-05 NOTE — Telephone Encounter (Unsigned)
 Copied from CRM [id number]. Topic: Clinical - Medication Refill >> Sep 05, 2024 10:20 AM Janeecia G wrote: Medication: oxycodone  10 mg  Has the patient contacted their pharmacy? Yes (Agent: If no, request that the patient contact the pharmacy for the refill. If patient does not wish to contact the pharmacy document the reason why and proceed with request.) (Agent: If yes, when and what did the pharmacy advise?)  This is the patient's preferred pharmacy:  [id number] - [place], [place] - [address]  [place] [address] Phone: [phone] Fax: [phone]  Is this the correct pharmacy for this prescription? Yes If no, delete pharmacy and type the correct one.   Has the prescription been filled recently? No  Is the patient out of the medication? Yes  Has the patient been seen for an appointment in the last year OR does the patient have an upcoming appointment? Yes  Can we respond through MyChart? No  Agent: Please be advised that Rx refills may take up to 3 business days. We ask that you follow-up with your pharmacy.

## 2024-09-06 ENCOUNTER — Other Ambulatory Visit: Payer: Self-pay | Admitting: Family Medicine

## 2024-09-06 MED ORDER — OXYCODONE HCL 10 MG PO TABS: ORAL_TABLET | ORAL | 0 refills | Status: DC

## 2024-09-16 ENCOUNTER — Telehealth: Payer: Self-pay | Admitting: *Deleted

## 2024-09-16 ENCOUNTER — Encounter: Payer: Self-pay | Admitting: *Deleted

## 2024-09-16 NOTE — Patient Instructions (Signed)
 Amanda Osborne - I am sorry I was unable to reach you today for our scheduled appointment. I work with Alphonsa Glendia LABOR, MD and am calling to support your healthcare needs. Please contact me at [phone] at your earliest convenience. I look forward to speaking with you soon.   Thank you,  Rosina Forte, BSN RN [facility], [facility] RN Care Manager Direct Dial: [phone]  Fax: [phone]

## 2024-09-19 ENCOUNTER — Ambulatory Visit: Admitting: Family Medicine

## 2024-09-19 VITALS — BP 120/80 | HR 57 | Temp 97.7°F | Ht 67.0 in | Wt 168.2 lb

## 2024-09-19 DIAGNOSIS — F411 Generalized anxiety disorder: Secondary | ICD-10-CM | POA: Diagnosis not present

## 2024-09-19 DIAGNOSIS — E038 Other specified hypothyroidism: Secondary | ICD-10-CM

## 2024-09-19 DIAGNOSIS — Z79891 Long term (current) use of opiate analgesic: Secondary | ICD-10-CM | POA: Diagnosis not present

## 2024-09-19 DIAGNOSIS — E7849 Other hyperlipidemia: Secondary | ICD-10-CM | POA: Diagnosis not present

## 2024-09-19 DIAGNOSIS — I10 Essential (primary) hypertension: Secondary | ICD-10-CM | POA: Diagnosis not present

## 2024-09-19 MED ORDER — PRAVASTATIN SODIUM 40 MG PO TABS: 40.0000 mg | ORAL_TABLET | Freq: Every day | ORAL | 1 refills | Status: AC

## 2024-09-19 MED ORDER — PANTOPRAZOLE SODIUM 40 MG PO TBEC: 40.0000 mg | DELAYED_RELEASE_TABLET | Freq: Every day | ORAL | 1 refills | Status: AC

## 2024-09-19 MED ORDER — FAMOTIDINE 20 MG PO TABS: 20.0000 mg | ORAL_TABLET | Freq: Two times a day (BID) | ORAL | 1 refills | Status: AC | PRN

## 2024-09-19 MED ORDER — OXYCODONE HCL 10 MG PO TABS: ORAL_TABLET | ORAL | 0 refills | Status: DC

## 2024-09-19 MED ORDER — HYDROCHLOROTHIAZIDE 12.5 MG PO TABS: ORAL_TABLET | ORAL | 2 refills | Status: AC

## 2024-09-19 MED ORDER — IBUPROFEN 400 MG PO TABS
ORAL_TABLET | ORAL | 5 refills | Status: AC
Start: 2024-09-19 — End: ?

## 2024-09-19 MED ORDER — LISINOPRIL 2.5 MG PO TABS: ORAL_TABLET | ORAL | 1 refills | Status: AC

## 2024-09-19 MED ORDER — ALPRAZOLAM 0.5 MG PO TABS: ORAL_TABLET | ORAL | 5 refills | Status: DC

## 2024-09-19 MED ORDER — POTASSIUM CHLORIDE ER 10 MEQ PO TBCR: EXTENDED_RELEASE_TABLET | ORAL | 1 refills | Status: AC

## 2024-09-19 MED ORDER — LEVOTHYROXINE SODIUM 25 MCG PO TABS: ORAL_TABLET | ORAL | 1 refills | Status: AC

## 2024-09-19 NOTE — Progress Notes (Addendum)
 Subjective:    Patient ID: Amanda Osborne, female    DOB: 1951/08/14, 73 y.o.   MRN: [id number]  HPI Patient is in room 6.  Patient is here for pain management.  This patient was seen today for chronic pain  The medication list was reviewed and updated.   Location of Pain for which the patient has been treated with regarding narcotics: Back pain knee pain  Onset of this pain: Present for years   -Compliance with medication: Good compliance  - Number patient states they take daily: 3 daily  -Reason for ongoing use of opioids does not get adequate relief with Tylenol  and NSAIDs  What other measures have been tried outside of opioids Tylenol  NSAIDs physical therapy previous surgery  In the ongoing specialists regarding this condition back specialist previously  -when was the last dose patient took?  Past 24 hours  The patient was advised the importance of maintaining medication and not using illegal substances with these.  Here for refills and follow up  The patient was educated that we can provide 3 monthly scripts for their medication, it is their responsibility to follow the instructions.  Side effects or complications from medications: None  Patient is aware that pain medications are meant to minimize the severity of the pain to allow their pain levels to improve to allow for better function. They are aware of that pain medications cannot totally remove their pain.  Due for UDT ( at least once per year) (pain management contract is also completed at the time of the UDT): Yearly  Scale of 1 to 10 ( 1 is least 10 is most) Your pain level without the medicine: 8-9 Your pain level with medication 5  Scale 1 to 10 ( 1-helps very little, 10 helps very well) How well does your pain medication reduce your pain so you can function better through out the day?  7-8  Quality of the pain: Throbbing aching  Persistence of the pain: Present all time  Modifying factors:  Worse with activity      Patient mentioned that she accidentally threw away a few of her medications about 2 months. She recently got back on the medication about 2 weeks ago and was concerned if that would/have affect her blood pressure and cholesterol levels.  Discussed the use of AI scribe software for clinical note transcription with the patient, who gave verbal consent to proceed.  History of Present Illness   Amanda Osborne is a 73 year old female who presents for medication management and follow-up.  She experiences increased joint pain, which she attributes to inflammation. The pain is chronic, and she manages it with ibuprofen , preferring a 400 mg dose but currently taking two 200 mg tablets due to difficulty swallowing larger pills. The ibuprofen  is provided for free, influencing her choice.  She reports low energy levels, which she associates with having 'a lot on my mind.' Anxiety affects her general health and energy levels. She is on Xanax  for anxiety management and is concerned about ensuring her prescriptions last until her next appointment in February.  She is currently taking lisinopril  and hydrochlorothiazide  (HCTZ) for blood pressure management. Her blood pressure was measured at 106/70 mmHg, and she reports feeling dizzy upon standing. She mentions occasional ankle swelling.       Review of Systems     Objective:   Physical Exam   General-in no acute distress Eyes-no discharge Lungs-respiratory rate normal, CTA CV-no murmurs,RRR Extremities skin  warm dry no edema Neuro grossly normal Behavior normal, alert Abdomen soft     Assessment & Plan:  1. HTN (hypertension), benign (Primary) Blood pressure decent control continue current measures  2. Encounter for long-term opiate analgesic use The patient was seen in followup for chronic pain. A review over at their current pain status was discussed. Drug registry was checked. Prescriptions were  given.  Regular follow-up recommended. Discussion was held regarding the importance of compliance with medication as well as pain medication contract.  Patient was informed that medication may cause drowsiness and should not be combined  with other medications/alcohol or street drugs. If the patient feels medication is causing altered alertness then do not drive or operate dangerous equipment.  Should be noted that the patient appears to be meeting appropriate use of opioids and response.  Evidenced by improved function and decent pain control without significant side effects and no evidence of overt aberrancy issues.  Upon discussion with the patient today they understand that opioid therapy is optional and they feel that the pain has been refractory to reasonable conservative measures and is significant and affecting quality of life enough to warrant ongoing therapy and wishes to continue opioids.  Refills were provided.  [place]  medical Board guidelines regarding the pain medicine has been reviewed.  CDC guidelines most updated 2022 has been reviewed by the prescriber.  PDMP is checked on a regular basis yearly urine drug screen and pain management contract  Treatment plan for this patient includes #1-gentle stretching exercises as shown daily basis 2.  Mild strength exercises 3 times per week #3 continue pain medications #4 notify us  if any digression She would benefit from continuing her pain medicine she does not use it PDMP was checked  3. Other hyperlipidemia Continue current measures healthy diet  4. Generalized anxiety disorder Low-dose Xanax  is indicated she has been on this for years we have tried to taper down as best as possible it does not cause drowsiness continue current measures  5. Other specified hypothyroidism Previous labs decent continue current measures  Assessment and Plan    Chronic pain with joint inflammation Chronic joint pain and inflammation persist.  Difficulty swallowing large pills leads to cutting ibuprofen , resulting in medication loss. - Prescribe ibuprofen  400 mg tablets for easier administration.  Low blood pressure Blood pressure is low at 106/70 mmHg with orthostatic hypotension, potentially causing dizziness. Current medications include lisinopril  and hydrochlorothiazide  (HCTZ). - Reduce hydrochlorothiazide  dose to 12.5 mg from 25 mg. - Reassess blood pressure at next visit to determine if further adjustments, such as stopping lisinopril , are necessary.  Edema of lower extremities Occasional ankle swelling reported. Adjusting HCTZ dosage to balance blood pressure management and edema control. - Monitor for changes in edema with adjusted HCTZ dosage.  Generalized anxiety disorder Anxiety affects energy levels.

## 2024-09-23 ENCOUNTER — Encounter: Payer: Self-pay | Admitting: Licensed Clinical Social Worker

## 2024-09-23 ENCOUNTER — Telehealth: Payer: Self-pay | Admitting: Licensed Clinical Social Worker

## 2024-09-23 NOTE — Patient Instructions (Signed)
 Winton JONETTA Server - I am sorry I was unable to reach you today for our scheduled appointment. I work with Alphonsa Glendia LABOR, MD and am calling to support your healthcare needs. Please contact me at [phone] at your earliest convenience. I look forward to speaking with you soon.   Thank you,  Lyle Rung, BSW, MSW, LCSW Licensed Clinical Social Worker [employer]   [facility] [email or web address] Direct Dial: [phone]

## 2024-12-25 ENCOUNTER — Ambulatory Visit: Admitting: Family Medicine

## 2024-12-25 VITALS — BP 110/75 | HR 60 | Temp 98.1°F | Ht 67.0 in | Wt 170.6 lb

## 2024-12-25 DIAGNOSIS — Z79891 Long term (current) use of opiate analgesic: Secondary | ICD-10-CM | POA: Diagnosis not present

## 2024-12-25 DIAGNOSIS — I10 Essential (primary) hypertension: Secondary | ICD-10-CM | POA: Diagnosis not present

## 2024-12-25 DIAGNOSIS — Z79899 Other long term (current) drug therapy: Secondary | ICD-10-CM

## 2024-12-25 DIAGNOSIS — F411 Generalized anxiety disorder: Secondary | ICD-10-CM | POA: Diagnosis not present

## 2024-12-25 DIAGNOSIS — E038 Other specified hypothyroidism: Secondary | ICD-10-CM

## 2024-12-25 DIAGNOSIS — E7849 Other hyperlipidemia: Secondary | ICD-10-CM

## 2024-12-25 MED ORDER — OXYCODONE HCL 10 MG PO TABS: ORAL_TABLET | ORAL | 0 refills | Status: AC

## 2024-12-25 MED ORDER — ALPRAZOLAM 0.5 MG PO TABS: ORAL_TABLET | ORAL | 5 refills | Status: AC

## 2024-12-25 MED ORDER — ROSUVASTATIN CALCIUM 20 MG PO TABS: 20.0000 mg | ORAL_TABLET | Freq: Every day | ORAL | 3 refills | Status: AC

## 2024-12-25 NOTE — Progress Notes (Signed)
 "  Subjective:    Patient ID: Amanda Osborne, female    DOB: 1951/03/28, 74 y.o.   MRN: [id number]  HPI  Room 6  Pt is here for a follow up on pain management   Pt is having issues with her 40 mg cholesterol pill - states it is too large to swallow and when she splits it it crumbles and cannot take desired amount  This patient was seen today for chronic pain  The medication list was reviewed and updated.   Location of Pain for which the patient has been treated with regarding narcotics: Cervical  Onset of this pain: Present for years   -Compliance with medication: Good compliance  - Number patient states they take daily: 3 tablets daily  -Reason for ongoing use of opioids did not get adequate relief with Tylenol  NSAIDs or surgery  What other measures have been tried outside of opioids surgery, physical therapy, NSAIDs  In the ongoing specialists regarding this condition previously back specialist  -when was the last dose patient took?  Past 24 hours  The patient was advised the importance of maintaining medication and not using illegal substances with these.  Here for refills and follow up  The patient was educated that we can provide 3 monthly scripts for their medication, it is their responsibility to follow the instructions.  Side effects or complications from medications: Denies side effects  Patient is aware that pain medications are meant to minimize the severity of the pain to allow their pain levels to improve to allow for better function. They are aware of that pain medications cannot totally remove their pain.  Due for UDT ( at least once per year) (pain management contract is also completed at the time of the UDT): On next visit  Scale of 1 to 10 ( 1 is least 10 is most) Your pain level without the medicine: 8 9 Your pain level with medication 5-6  Scale 1 to 10 ( 1-helps very little, 10 helps very well) How well does your pain medication reduce your pain  so you can function better through out the day?  7-8  Quality of the pain: Aching throbbing  Persistence of the pain: Present all time  Modifying factors: Worse with activity      Review of Systems     Objective:   Physical Exam General-in no acute distress Eyes-no discharge Lungs-respiratory rate normal, CTA CV-no murmurs,RRR Extremities skin warm dry no edema Neuro grossly normal Behavior normal, alert        Assessment & Plan:   1. Encounter for long-term opiate analgesic use (Primary) The patient was seen in followup for chronic pain. A review over at their current pain status was discussed. Drug registry was checked. Prescriptions were given.  Regular follow-up recommended. Discussion was held regarding the importance of compliance with medication as well as pain medication contract.  Patient was informed that medication may cause drowsiness and should not be combined  with other medications/alcohol or street drugs. If the patient feels medication is causing altered alertness then do not drive or operate dangerous equipment.  Should be noted that the patient appears to be meeting appropriate use of opioids and response.  Evidenced by improved function and decent pain control without significant side effects and no evidence of overt aberrancy issues.  Upon discussion with the patient today they understand that opioid therapy is optional and they feel that the pain has been refractory to reasonable conservative measures and is significant  and affecting quality of life enough to warrant ongoing therapy and wishes to continue opioids.  Refills were provided.  [place]  medical Board guidelines regarding the pain medicine has been reviewed.  CDC guidelines most updated 2022 has been reviewed by the prescriber.  PDMP is checked on a regular basis yearly urine drug screen and pain management contract  Treatment plan for this patient includes #1-gentle stretching  exercises as shown daily basis 2.  Mild strength exercises 3 times per week #3 continue pain medications #4 notify us  if any digression   2. Other hyperlipidemia Switch to Crestor  20 mg hopefully the tablet smaller easier for her to swallow she will let us  know if she cannot afford it  3. HTN (hypertension), benign Continue medications as is blood pressure control good  4. Other specified hypothyroidism Continue meds as is  5. High risk medication use Labs  6. Generalized anxiety disorder She has been on Xanax  long-term we have tried tapering we have got down to 0.5 twice daily patient did not tolerate going lower than that she does not have drowsiness with this medicine continue current measures as it is Patient will do her labs in the near future Follow-up again in approximately 3 to 4 months She will do her lab work as planned "

## 2025-01-17 ENCOUNTER — Ambulatory Visit: Payer: Medicare Other

## 2025-03-25 ENCOUNTER — Ambulatory Visit: Admitting: Family Medicine
